# Patient Record
Sex: Male | Born: 1950 | Race: Black or African American | Hispanic: No | Marital: Married | Smoking: Former smoker
Health system: Southern US, Community
[De-identification: ages and names within clinical notes are randomized; demographics above are authoritative.]

## PROBLEM LIST (undated history)

## (undated) DIAGNOSIS — T8859XA Other complications of anesthesia, initial encounter: Secondary | ICD-10-CM

## (undated) DIAGNOSIS — K219 Gastro-esophageal reflux disease without esophagitis: Secondary | ICD-10-CM

## (undated) DIAGNOSIS — M5126 Other intervertebral disc displacement, lumbar region: Secondary | ICD-10-CM

## (undated) DIAGNOSIS — E119 Type 2 diabetes mellitus without complications: Secondary | ICD-10-CM

## (undated) DIAGNOSIS — M1990 Unspecified osteoarthritis, unspecified site: Secondary | ICD-10-CM

## (undated) DIAGNOSIS — I639 Cerebral infarction, unspecified: Secondary | ICD-10-CM

## (undated) DIAGNOSIS — E785 Hyperlipidemia, unspecified: Secondary | ICD-10-CM

## (undated) DIAGNOSIS — I10 Essential (primary) hypertension: Secondary | ICD-10-CM

## (undated) DIAGNOSIS — T4145XA Adverse effect of unspecified anesthetic, initial encounter: Secondary | ICD-10-CM

## (undated) DIAGNOSIS — M51369 Other intervertebral disc degeneration, lumbar region without mention of lumbar back pain or lower extremity pain: Secondary | ICD-10-CM

## (undated) DIAGNOSIS — S46909A Unspecified injury of unspecified muscle, fascia and tendon at shoulder and upper arm level, unspecified arm, initial encounter: Secondary | ICD-10-CM

## (undated) DIAGNOSIS — M5136 Other intervertebral disc degeneration, lumbar region: Secondary | ICD-10-CM

## (undated) HISTORY — PX: SHOULDER ARTHROSCOPY: SHX128

## (undated) HISTORY — PX: EYE SURGERY: SHX253

---
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
## 2004-08-28 ENCOUNTER — Ambulatory Visit: Payer: Self-pay | Admitting: Unknown Physician Specialty

## 2005-01-09 ENCOUNTER — Ambulatory Visit: Payer: Self-pay | Admitting: Unknown Physician Specialty

## 2005-03-07 ENCOUNTER — Ambulatory Visit: Payer: Self-pay | Admitting: Unknown Physician Specialty

## 2006-03-13 ENCOUNTER — Emergency Department: Payer: Self-pay | Admitting: Unknown Physician Specialty

## 2006-05-12 ENCOUNTER — Emergency Department: Payer: Self-pay | Admitting: Emergency Medicine

## 2006-05-24 ENCOUNTER — Emergency Department: Payer: Self-pay | Admitting: Emergency Medicine

## 2006-05-29 ENCOUNTER — Ambulatory Visit: Payer: Self-pay | Admitting: Internal Medicine

## 2006-06-19 ENCOUNTER — Ambulatory Visit: Payer: Self-pay | Admitting: Internal Medicine

## 2011-05-17 ENCOUNTER — Ambulatory Visit: Payer: Self-pay | Admitting: "Endocrinology

## 2012-12-29 ENCOUNTER — Emergency Department: Payer: Self-pay | Admitting: Emergency Medicine

## 2012-12-29 LAB — URINALYSIS, COMPLETE
Bacteria: NONE SEEN
Bilirubin,UR: NEGATIVE
Glucose,UR: NEGATIVE mg/dL (ref 0–75)
Ketone: NEGATIVE
Leukocyte Esterase: NEGATIVE
Nitrite: NEGATIVE
Ph: 5 (ref 4.5–8.0)
Protein: 30
RBC,UR: 2 /HPF (ref 0–5)
Specific Gravity: 1.024 (ref 1.003–1.030)
Squamous Epithelial: NONE SEEN
WBC UR: 1 /HPF (ref 0–5)

## 2012-12-29 LAB — COMPREHENSIVE METABOLIC PANEL
Albumin: 3.9 g/dL (ref 3.4–5.0)
Alkaline Phosphatase: 86 U/L (ref 50–136)
Anion Gap: 6 — ABNORMAL LOW (ref 7–16)
BUN: 17 mg/dL (ref 7–18)
Bilirubin,Total: 0.5 mg/dL (ref 0.2–1.0)
Calcium, Total: 9.8 mg/dL (ref 8.5–10.1)
Chloride: 105 mmol/L (ref 98–107)
Co2: 28 mmol/L (ref 21–32)
Creatinine: 1 mg/dL (ref 0.60–1.30)
EGFR (African American): >60
EGFR (Non-African Amer.): >60
Glucose: 150 mg/dL — ABNORMAL HIGH (ref 65–99)
Osmolality: 282 (ref 275–301)
Potassium: 4.1 mmol/L (ref 3.5–5.1)
SGOT(AST): 25 U/L (ref 15–37)
SGPT (ALT): 38 U/L (ref 12–78)
Sodium: 139 mmol/L (ref 136–145)
Total Protein: 8.1 g/dL (ref 6.4–8.2)

## 2012-12-29 LAB — CBC WITH DIFFERENTIAL/PLATELET
Basophil #: 0.1 10*3/uL (ref 0.0–0.1)
Basophil %: 1.1 %
Eosinophil #: 0.4 10*3/uL (ref 0.0–0.7)
Eosinophil %: 4.7 %
HCT: 44.9 % (ref 40.0–52.0)
HGB: 15.3 g/dL (ref 13.0–18.0)
Lymphocyte #: 3.2 10*3/uL (ref 1.0–3.6)
Lymphocyte %: 36.3 %
MCH: 29 pg (ref 26.0–34.0)
MCHC: 34 g/dL (ref 32.0–36.0)
MCV: 85 fL (ref 80–100)
Monocyte #: 0.9 x10 3/mm (ref 0.2–1.0)
Monocyte %: 9.7 %
Neutrophil #: 4.2 10*3/uL (ref 1.4–6.5)
Neutrophil %: 48.2 %
Platelet: 259 10*3/uL (ref 150–440)
RBC: 5.28 10*6/uL (ref 4.40–5.90)
RDW: 12.6 % (ref 11.5–14.5)
WBC: 8.8 10*3/uL (ref 3.8–10.6)

## 2012-12-29 LAB — LIPASE, BLOOD: Lipase: 142 U/L (ref 73–393)

## 2013-01-18 ENCOUNTER — Ambulatory Visit: Payer: Self-pay | Admitting: Family Medicine

## 2013-06-17 ENCOUNTER — Ambulatory Visit: Payer: Self-pay | Admitting: Family Medicine

## 2014-04-04 ENCOUNTER — Ambulatory Visit: Payer: Self-pay | Admitting: Orthopedic Surgery

## 2014-05-09 LAB — HEMOGLOBIN A1C: Hgb A1c MFr Bld: 6.4 % — AB (ref 4.0–6.0)

## 2014-06-08 ENCOUNTER — Ambulatory Visit: Payer: Self-pay | Admitting: Anesthesiology

## 2014-06-08 LAB — BASIC METABOLIC PANEL
Anion Gap: 7 (ref 7–16)
BUN: 16 mg/dL (ref 7–18)
Calcium, Total: 9.1 mg/dL (ref 8.5–10.1)
Chloride: 104 mmol/L (ref 98–107)
Co2: 29 mmol/L (ref 21–32)
Creatinine: 0.99 mg/dL (ref 0.60–1.30)
EGFR (African American): >60
EGFR (Non-African Amer.): >60
Glucose: 135 mg/dL — ABNORMAL HIGH (ref 65–99)
Osmolality: 283 (ref 275–301)
Potassium: 4.2 mmol/L (ref 3.5–5.1)
Sodium: 140 mmol/L (ref 136–145)

## 2014-06-15 ENCOUNTER — Ambulatory Visit: Payer: Self-pay | Admitting: General Practice

## 2014-08-31 DIAGNOSIS — Z9889 Other specified postprocedural states: Secondary | ICD-10-CM | POA: Insufficient documentation

## 2014-08-31 HISTORY — DX: Other specified postprocedural states: Z98.890

## 2014-09-23 DIAGNOSIS — M75122 Complete rotator cuff tear or rupture of left shoulder, not specified as traumatic: Secondary | ICD-10-CM | POA: Insufficient documentation

## 2014-09-23 HISTORY — DX: Complete rotator cuff tear or rupture of left shoulder, not specified as traumatic: M75.122

## 2014-12-10 ENCOUNTER — Ambulatory Visit
Admit: 2014-12-10 | Disposition: A | Discharge status: Home / Self Care | Payer: Self-pay | Attending: Surgery | Admitting: Surgery

## 2014-12-10 NOTE — Op Note (Signed)
PATIENT NAME:  Bruce ActonCARR, Miking D MR#:  [id number] DATE OF BIRTH:  Nov 05, 1950  DATE OF PROCEDURE:  06/15/2014   PREOPERATIVE DIAGNOSIS: Right rotator cuff tear (supraspinatus).   POSTOPERATIVE DIAGNOSIS:   Right rotator cuff tear (supraspinatus)  PROCEDURE PERFORMED: Right subacromial decompression and rotator cuff repair.   SURGEON: Illene LabradorJames P. Hooten, MD   ANESTHESIA: Interscalene block and general.   ESTIMATED BLOOD LOSS: Minimal.   FLUIDS REPLACED: 1700 mL of crystalloid.   DRAINS: None.   IMPLANTS UTILIZED: ArthroCare Spartan 5.5 mm PEEK suture anchors x 2.   INDICATIONS FOR SURGERY: The patient is a 64 year old male who has been seen for complaints of right shoulder pain and limited range of motion. MRI demonstrated findings consistent with full-thickness tear of supraspinatus tendon. After discussion of the risks and benefits of surgical intervention, the patient expressed understanding of the risks and benefits and agreed with plans for surgical intervention.   PROCEDURE IN DETAIL: The patient was brought to the operating room and, after adequate interscalene block and general endotracheal anesthesia was achieved, the patient was placed in the modified beach chair position. The head was secured in headrest and all bony prominences were well padded. The patient's right shoulder and arm were cleaned and prepped with alcohol and DuraPrep and draped in the usual sterile fashion. A "timeout" was performed as per usual protocol. The anticipated incision site was injected with 0.25% Marcaine with epinephrine. An anterior oblique incision was made roughly bisecting the anterior aspect of the acromion. Deltoid was split in line and a "deltoid on" approach was utilized by elevating the deltoid in a subperiosteal fashion off of the acromion. The subdeltoid bursa was excised. A prominent subacromial osteophyte was noted. A Darrach retractor was inserted so as to protect the cuff. An osteotome was used to  remove a 4 mm wafer of bone from the anterior/inferior aspect of the acromion. Additional debridement and decompression was performed using a TPS high-speed rasp. The wound was irrigated with copious amounts of normal saline with antibiotic solution. The shoulder was placed through range of motion and there was noted be a full-thickness tear of the supraspinatus tendon with some retraction. The leading edge of the supraspinatus tendon was debrided sharply using a scalpel. Rongeurs were used to create a bleeding base adjacent to the greater tuberosity. Two Spartan 5.5 mm PEEK suture anchors were inserted and the supraspinatus tendon was reapproximated to the bone bed using the ORTHOCORD sutures.   Excellent fixation was noted. The shoulder was placed through a range of motion with excellent decompression of the subacromial space and excellent maintenance of the repair. The wound was irrigated with copious amounts of normal saline with antibiotic solution. Good hemostasis was noted. The deltoid was repaired in a side-to-side fashion using interrupted sutures of #1 Ethibond. The subcutaneous tissue was approximated in layers using first #0 Vicryl followed by 2-0 Vicryl. The skin was closed using a running subcuticular suture of 4-0 Vicryl. Steri-Strips were applied followed by application of a sterile dressing. The patient tolerated the procedure well. He was transported to the recovery room in stable condition.    ____________________________ Illene LabradorJames P. Angie FavaHooten Jr., MD jph:jp D: 06/16/2014 00:17:55 ET T: 06/16/2014 10:27:00 ET JOB#: [id number]  cc: Fayrene FearingJames P. Angie FavaHooten Jr., MD, <Dictator> JAMES P Angie FavaHOOTEN JR MD ELECTRONICALLY SIGNED 06/17/2014 9:54

## 2014-12-20 ENCOUNTER — Encounter
Admission: RE | Admit: 2014-12-20 | Discharge: 2014-12-20 | Disposition: A | Discharge status: Home / Self Care | Payer: Medicaid Other | Source: Ambulatory Visit | Attending: Surgery | Admitting: Surgery

## 2014-12-20 DIAGNOSIS — Z01812 Encounter for preprocedural laboratory examination: Secondary | ICD-10-CM | POA: Diagnosis not present

## 2014-12-20 DIAGNOSIS — M75121 Complete rotator cuff tear or rupture of right shoulder, not specified as traumatic: Secondary | ICD-10-CM | POA: Diagnosis not present

## 2014-12-20 HISTORY — DX: Other intervertebral disc degeneration, lumbar region without mention of lumbar back pain or lower extremity pain: M51.369

## 2014-12-20 HISTORY — DX: Other intervertebral disc displacement, lumbar region: M51.26

## 2014-12-20 HISTORY — DX: Other complications of anesthesia, initial encounter: T88.59XA

## 2014-12-20 HISTORY — DX: Essential (primary) hypertension: I10

## 2014-12-20 HISTORY — DX: Type 2 diabetes mellitus without complications: E11.9

## 2014-12-20 HISTORY — DX: Gastro-esophageal reflux disease without esophagitis: K21.9

## 2014-12-20 HISTORY — DX: Adverse effect of unspecified anesthetic, initial encounter: T41.45XA

## 2014-12-20 HISTORY — DX: Other intervertebral disc degeneration, lumbar region: M51.36

## 2014-12-20 LAB — BASIC METABOLIC PANEL
Anion gap: 8 (ref 5–15)
BUN: 14 mg/dL (ref 6–20)
CO2: 27 mmol/L (ref 22–32)
Calcium: 9.4 mg/dL (ref 8.9–10.3)
Chloride: 105 mmol/L (ref 101–111)
Creatinine, Ser: 1.06 mg/dL (ref 0.61–1.24)
GFR calc Af Amer: >60 mL/min (ref >60)
GFR calc non Af Amer: >60 mL/min (ref >60)
Glucose, Bld: 155 mg/dL — ABNORMAL HIGH (ref 65–99)
Potassium: 4.1 mmol/L (ref 3.5–5.1)
Sodium: 140 mmol/L (ref 135–145)

## 2014-12-20 LAB — CBC
HCT: 46.5 % (ref 40.0–52.0)
Hemoglobin: 15.2 g/dL (ref 13.0–18.0)
MCH: 28.4 pg (ref 26.0–34.0)
MCHC: 32.8 g/dL (ref 32.0–36.0)
MCV: 86.7 fL (ref 80.0–100.0)
Platelets: 224 10*3/uL (ref 150–440)
RBC: 5.36 MIL/uL (ref 4.40–5.90)
RDW: 12.3 % (ref 11.5–14.5)
WBC: 7.1 10*3/uL (ref 3.8–10.6)

## 2014-12-20 NOTE — OR Nursing (Signed)
Pt scored high on suicide risk assessment.  Dr. Joice LoftsPoggi notified via Crystal at his office..Marland Kitchen

## 2014-12-20 NOTE — Patient Instructions (Signed)
  Your procedure is scheduled on: 12/22/14  Report to Day Surgery. To find out your arrival time please call [phone] between 1PM - 3PM on 12/21/14.  Remember: Instructions that are not followed completely may result in serious medical risk, up to and including death, or upon the discretion of your surgeon and anesthesiologist your surgery may need to be rescheduled.    _x___ 1. Do not eat food or drink liquids after midnight. No gum chewing or hard candies.     ____ 2. No Alcohol for 24 hours before or after surgery.   ____ 3. Bring all medications with you on the day of surgery if instructed.    _x__ 4. Notify your doctor if there is any change in your medical condition     (cold, fever, infections).     Do not wear jewelry, make-up, hairpins, clips or nail polish.  Do not wear lotions, powders, or perfumes. You may wear deodorant.  Do not shave 48 hours prior to surgery. Men may shave face and neck.  Do not bring valuables to the hospital.    [facility] is not responsible for any belongings or valuables.               Contacts, dentures or bridgework may not be worn into surgery.  Leave your suitcase in the car. After surgery it may be brought to your room.  For patients admitted to the hospital, discharge time is determined by your                treatment team.   Patients discharged the day of surgery will not be allowed to drive home.   Please read over the following fact sheets that you were given:      ____ Take these medicines the morning of surgery with A SIP OF WATER:    1. lisinopril (PRINIVIL,ZESTRIL  2. pantoprazole (PROTONIX) 40 MG tablet      ____ Fleet Enema (as directed)   ____ Use CHG Soap as directed  ____ Use inhalers on the day of surgery  _x___ Stop metformin 2 days prior to surgery    ____ Take 1/2 of usual insulin dose the night before surgery and none on the morning of surgery.   ____ Stop Coumadin/Plavix/aspirin on   ____ Stop  Anti-inflammatories on    ____ Stop supplements until after surgery.    ____ Bring C-Pap to the hospital.

## 2014-12-22 ENCOUNTER — Ambulatory Visit: Payer: Medicaid Other | Admitting: Anesthesiology

## 2014-12-22 ENCOUNTER — Encounter: Payer: Self-pay | Admitting: *Deleted

## 2014-12-22 ENCOUNTER — Ambulatory Visit
Admission: RE | Admit: 2014-12-22 | Discharge: 2014-12-22 | Disposition: A | Discharge status: Home / Self Care | Payer: Medicaid Other | Source: Ambulatory Visit | Attending: Surgery | Admitting: Surgery

## 2014-12-22 ENCOUNTER — Encounter
Admission: RE | Disposition: A | Discharge status: Home / Self Care | Payer: Self-pay | Source: Ambulatory Visit | Attending: Surgery

## 2014-12-22 DIAGNOSIS — E785 Hyperlipidemia, unspecified: Secondary | ICD-10-CM | POA: Insufficient documentation

## 2014-12-22 DIAGNOSIS — I10 Essential (primary) hypertension: Secondary | ICD-10-CM | POA: Insufficient documentation

## 2014-12-22 DIAGNOSIS — Z885 Allergy status to narcotic agent status: Secondary | ICD-10-CM | POA: Diagnosis not present

## 2014-12-22 DIAGNOSIS — M1990 Unspecified osteoarthritis, unspecified site: Secondary | ICD-10-CM | POA: Insufficient documentation

## 2014-12-22 DIAGNOSIS — M65811 Other synovitis and tenosynovitis, right shoulder: Secondary | ICD-10-CM | POA: Insufficient documentation

## 2014-12-22 DIAGNOSIS — Z87891 Personal history of nicotine dependence: Secondary | ICD-10-CM | POA: Insufficient documentation

## 2014-12-22 DIAGNOSIS — M7521 Bicipital tendinitis, right shoulder: Secondary | ICD-10-CM | POA: Insufficient documentation

## 2014-12-22 DIAGNOSIS — M5136 Other intervertebral disc degeneration, lumbar region: Secondary | ICD-10-CM | POA: Insufficient documentation

## 2014-12-22 DIAGNOSIS — M75101 Unspecified rotator cuff tear or rupture of right shoulder, not specified as traumatic: Secondary | ICD-10-CM | POA: Diagnosis not present

## 2014-12-22 DIAGNOSIS — Z79899 Other long term (current) drug therapy: Secondary | ICD-10-CM | POA: Insufficient documentation

## 2014-12-22 DIAGNOSIS — Z9889 Other specified postprocedural states: Secondary | ICD-10-CM | POA: Insufficient documentation

## 2014-12-22 DIAGNOSIS — E118 Type 2 diabetes mellitus with unspecified complications: Secondary | ICD-10-CM | POA: Diagnosis not present

## 2014-12-22 HISTORY — PX: SHOULDER ARTHROSCOPY: SHX128

## 2014-12-22 LAB — GLUCOSE, CAPILLARY
Glucose-Capillary: 234 mg/dL — ABNORMAL HIGH (ref 70–99)
Glucose-Capillary: 230 mg/dL — ABNORMAL HIGH (ref 70–99)

## 2014-12-22 SURGERY — ARTHROSCOPY, SHOULDER
Anesthesia: General | Laterality: Right | Wound class: Clean

## 2014-12-22 MED ORDER — ACETAMINOPHEN 10 MG/ML IV SOLN
INTRAVENOUS | Status: AC
  Filled 2014-12-22: qty 100

## 2014-12-22 MED ORDER — CEFAZOLIN SODIUM-DEXTROSE 2-3 GM-% IV SOLR
INTRAVENOUS | Status: AC
  Administered 2014-12-22: 2 g via INTRAVENOUS
  Filled 2014-12-22: qty 50

## 2014-12-22 MED ORDER — GLYCOPYRROLATE 0.2 MG/ML IJ SOLN
INTRAMUSCULAR | Status: DC | PRN
  Administered 2014-12-22: .8 mg via INTRAVENOUS

## 2014-12-22 MED ORDER — BUPIVACAINE-EPINEPHRINE (PF) 0.5% -1:200000 IJ SOLN
INTRAMUSCULAR | Status: DC | PRN
  Administered 2014-12-22: 20 mL via PERINEURAL

## 2014-12-22 MED ORDER — PHENYLEPHRINE HCL 10 MG/ML IJ SOLN
INTRAMUSCULAR | Status: DC | PRN
  Administered 2014-12-22 (×2): 100 ug via INTRAVENOUS

## 2014-12-22 MED ORDER — FENTANYL CITRATE (PF) 100 MCG/2ML IJ SOLN
INTRAMUSCULAR | Status: AC
  Administered 2014-12-22: 50 ug via INTRAVENOUS
  Administered 2014-12-22: 100 ug via INTRAVENOUS
  Administered 2014-12-22 (×2): 50 ug via INTRAVENOUS
  Filled 2014-12-22: qty 2

## 2014-12-22 MED ORDER — EPINEPHRINE HCL 1 MG/ML IJ SOLN
INTRAMUSCULAR | Status: DC | PRN
  Administered 2014-12-22: 1000 mL

## 2014-12-22 MED ORDER — EPHEDRINE SULFATE 50 MG/ML IJ SOLN
INTRAMUSCULAR | Status: DC | PRN
  Administered 2014-12-22: 10 mg via INTRAVENOUS
  Administered 2014-12-22: 15 mg via INTRAVENOUS

## 2014-12-22 MED ORDER — SODIUM CHLORIDE 0.9 % IV SOLN
INTRAVENOUS | Status: DC
  Administered 2014-12-22 (×3): via INTRAVENOUS

## 2014-12-22 MED ORDER — ONDANSETRON HCL 4 MG/2ML IJ SOLN: 4.0000 mg | Freq: Once | INTRAMUSCULAR | Status: DC | PRN

## 2014-12-22 MED ORDER — ONDANSETRON HCL 4 MG/2ML IJ SOLN
INTRAMUSCULAR | Status: DC | PRN
  Administered 2014-12-22: 4 mg via INTRAVENOUS

## 2014-12-22 MED ORDER — EPINEPHRINE HCL 1 MG/ML IJ SOLN
INTRAMUSCULAR | Status: AC
  Filled 2014-12-22: qty 2

## 2014-12-22 MED ORDER — NEOSTIGMINE METHYLSULFATE 10 MG/10ML IV SOLN
INTRAVENOUS | Status: DC | PRN
  Administered 2014-12-22: 5 mg via INTRAVENOUS

## 2014-12-22 MED ORDER — HYDROMORPHONE HCL 1 MG/ML IJ SOLN: 0.2500 mg | INTRAMUSCULAR | Status: DC | PRN

## 2014-12-22 MED ORDER — MIDAZOLAM HCL 5 MG/5ML IJ SOLN
INTRAMUSCULAR | Status: AC
  Administered 2014-12-22: 2 mg via INTRAVENOUS
  Filled 2014-12-22: qty 5

## 2014-12-22 MED ORDER — PROPOFOL 10 MG/ML IV BOLUS
INTRAVENOUS | Status: DC | PRN
  Administered 2014-12-22: 30 mg via INTRAVENOUS
  Administered 2014-12-22: 170 mg via INTRAVENOUS

## 2014-12-22 MED ORDER — PROPOFOL 10 MG/ML IV BOLUS: INTRAVENOUS | Status: DC | PRN

## 2014-12-22 MED ORDER — OXYCODONE HCL 5 MG PO TABS: 5.0000 mg | ORAL_TABLET | ORAL | Status: DC | PRN

## 2014-12-22 MED ORDER — ROCURONIUM BROMIDE 100 MG/10ML IV SOLN
INTRAVENOUS | Status: DC | PRN
  Administered 2014-12-22: 10 mg via INTRAVENOUS
  Administered 2014-12-22: 40 mg via INTRAVENOUS
  Administered 2014-12-22: 10 mg via INTRAVENOUS

## 2014-12-22 MED ORDER — BUPIVACAINE-EPINEPHRINE (PF) 0.5% -1:200000 IJ SOLN
INTRAMUSCULAR | Status: AC
  Filled 2014-12-22: qty 30

## 2014-12-22 MED ORDER — ROPIVACAINE HCL 5 MG/ML IJ SOLN
INTRAMUSCULAR | Status: AC
  Filled 2014-12-22: qty 20

## 2014-12-22 SURGICAL SUPPLY — 42 items
ANCHOR JUGGERKNOT 2.9 (Anchor) ×4 IMPLANT
ANCHOR JUGGERKNOT WTAP NDL 2.9 (Anchor) IMPLANT
BIT DRILL JUGRKNT W/NDL BIT2.9 (DRILL) ×1 IMPLANT
BLADE FULL RADIUS 3.5 (BLADE) ×2 IMPLANT
BLADE SHAVER 4.5X7 STR FR (MISCELLANEOUS) IMPLANT
BUR ACROMIONIZER 4.0 (BURR) ×2 IMPLANT
BUR BR 5.5 WIDE MOUTH (BURR) IMPLANT
CANNULA 8.5X75 THRED (CANNULA) ×2 IMPLANT
CANNULA SHAVER 8MMX76MM (CANNULA) ×2 IMPLANT
CHLORAPREP W/TINT 26ML (MISCELLANEOUS) ×2 IMPLANT
DRAPE IMP U-DRAPE 54X76 (DRAPES) ×4 IMPLANT
DRAPE SURG 17X11 SM STRL (DRAPES) ×2 IMPLANT
DRILL JUGGERKNOT W/NDL BIT 2.9 (DRILL) ×2
GAUZE PETRO XEROFOAM 1X8 (MISCELLANEOUS) ×2 IMPLANT
GAUZE SPONGE 4X4 12PLY STRL (GAUZE/BANDAGES/DRESSINGS) ×2 IMPLANT
GLOVE BIO SURGEON STRL SZ8 (GLOVE) ×4 IMPLANT
GLOVE INDICATOR 8.0 STRL GRN (GLOVE) ×6 IMPLANT
GOWN STRL REUS W/ TWL LRG LVL3 (GOWN DISPOSABLE) ×1 IMPLANT
GOWN STRL REUS W/ TWL XL LVL3 (GOWN DISPOSABLE) ×2 IMPLANT
GOWN STRL REUS W/TWL LRG LVL3 (GOWN DISPOSABLE) ×1
GOWN STRL REUS W/TWL XL LVL3 (GOWN DISPOSABLE) ×2
GRASPER SUT 15 45D LOW PRO (SUTURE) IMPLANT
IV LACTATED RINGER IRRG 3000ML (IV SOLUTION) ×2
IV LR IRRIG 3000ML ARTHROMATIC (IV SOLUTION) ×2 IMPLANT
MANIFOLD NEPTUNE II (INSTRUMENTS) ×2 IMPLANT
MASK FACE SPIDER DISP (MASK) ×2 IMPLANT
MAT BLUE FLOOR 46X72 FLO (MISCELLANEOUS) ×2 IMPLANT
NEEDLE REVERSE CUT 1/2 CRC (NEEDLE) ×2 IMPLANT
PACK ARTHROSCOPY SHOULDER (MISCELLANEOUS) ×2 IMPLANT
PAD GROUND ADULT SPLIT (MISCELLANEOUS) ×2 IMPLANT
SLING ARM LRG DEEP (SOFTGOODS) ×2 IMPLANT
SLING ULTRA II LG (MISCELLANEOUS) ×2 IMPLANT
STAPLER SKIN PROX 35W (STAPLE) IMPLANT
STRAP SAFETY BODY (MISCELLANEOUS) ×4 IMPLANT
SUT ETHIBOND 0 MO6 C/R (SUTURE) ×2 IMPLANT
SUT PROLENE 4 0 PS 2 18 (SUTURE) ×2 IMPLANT
SUT VIC AB 2-0 CT1 27 (SUTURE) ×2
SUT VIC AB 2-0 CT1 TAPERPNT 27 (SUTURE) ×2 IMPLANT
TAPE MICROFOAM 4IN (TAPE) ×2 IMPLANT
TUBING ARTHRO INFLOW-ONLY STRL (TUBING) ×2 IMPLANT
TUBING CONNECTING 10 (TUBING) ×2 IMPLANT
WAND HAND CNTRL MULTIVAC 90 (MISCELLANEOUS) ×2 IMPLANT

## 2014-12-22 NOTE — Op Note (Signed)
Preoperative diagnosis: Recurrent rotator cuff tear right shoulder.  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  Procedure: Arthroscopic debridement, arthroscopic subacromial decompression, mini-open rotator cuff repair, and mini-open biceps tenodesis right shoulder.  Anesthesia: General endotracheal with interscalene block placed preoperatively by the anesthesiologist.  Findings: As above. There was a small near full-thickness tear involving the anterior insertional fibers of the supraspinatus tendon. The labrum itself was in excellent condition. There also were some tendinopathic changes of the biceps tendon.  Complications: None  Fluids:  800 cc  Estimated blood loss: 25 cc  Tourniquet time: None  Drains: None  Closure: Staples   Brief clinical note: The patient is a 64 year old male who is now approximately 7-8 months status post a mini open rotator cuff repair of his right shoulder. He has noted continued pain in the right shoulder postoperatively. The patient's symptoms have persisted despite medications, activity modification, physical therapy, etc. The patient's history and examination were consistent with a recurrent rotator cuff tear. These findings were confirmed by MRI scan. The patient presents at this time for definitive management of these shoulder symptoms.  Procedure: The patient was brought into the operating room and lain in the supine position. After adequate IV sedation was achieved, the patient underwent placement of an interscalene block by the anesthesiologist. The patient then underwent general endotracheal intubation and anesthesia before being repositioned in the beach chair position using the beach chair position. The right shoulder and upper extremity were prepped with ChloraPrep solution before being draped sterilely. Preoperative antibiotics were administered. A timeout was performed to  confirm the proper side was prepped before the expected portal sites and incision site were injected with 0.5% Sensorcaine with epinephrine. A posterior portal was created and the glenohumeral joint thoroughly inspected with the findings as described above. An anterior portal was created using an outside-in technique. The labrum and rotator cuff were further probed, again confirming the above-noted findings. The full radius resector was used to debride the labrum and the synovitis noted anteriorly and superiorly. There was a moderate-sized sub-labral recess with a Buford complex, but the labrum otherwise was intact. The biceps tendon demonstrated some tendinopathy changes with synovitis along the tendon, so the tendon was released from its labral attachment using the ArthroCare wand. The ArthroCare wand also was used to obtain hemostasis, as well as to "anneal" the labrum superiorly and anteriorly. The instruments were removed from the joint after suctioning the excess fluid.  The camera was repositioned through the posterior portal into the subacromial space. A separate lateral portal was created using an outside-in technique. The 3.5 full-radius resector was introduced and used to perform a subtotal bursectomy. The ArthroCare wand was then inserted and used to remove the periosteal tissue off the undersurface of the anterior third of the acromion as well as to recess the coracoacromial ligament from its attachment along the anterior and lateral margins of the acromion. The 4.0 mm acromionizing bur was introduced and used to complete the decompression by removing the undersurface of the anterior third of the acromion. The full radius resector was reintroduced to remove any residual bony debris before the ArthroCare wand was reintroduced to obtain hemostasis. The instruments were then removed from the subacromial space after suctioning the excess fluid.  Using the previous incision, an approximately 4-5 cm  incision was made over the anterolateral aspect of the shoulder beginning at the anterolateral corner of the acromion and extending distally in line with the bicipital groove. This incision  was carried down through the subcutaneous tissues to expose the deltoid fascia. The raphae between the anterior and middle thirds was identified and this plane developed to provide access into the subacromial space. The rotator cuff tear was readily identified. The margins of the recurrent rotator cuff tear were debrided sharply with a #15 blade before the exposed greater tuberosity was roughened with a rongeur. The tear measured approximately 1 x 1.2 cm. The tear was repaired using a single Biomet 2.9 mm JuggerKnot anchor. These sutures were then brought back laterally and tied over bone bridges to create a two-layer closure. An apparent watertight closure was obtained.  The bicipital groove was identified by palpation and opened for 1-1.5 cm. The biceps tendon stump was retrieved through this defect. The floor of the bicipital groove was roughened with a curet before another Biomet 2.9 mm JuggerKnot anchor was inserted. Both sets of sutures were passed through the biceps tendon to effect the tenodesis. The bicipital sheath was reapproximated using two #0 Ethibond interrupted sutures, incorporating the biceps tendon to further reinforce the tenodesis.  The wound was copiously irrigated with sterile saline solution before the deltoid raphae was reapproximated using 2-0 Vicryl interrupted sutures. The subcutaneous tissues were closed in two layers using 2-0 Vicryl interrupted sutures before the skin was closed using staples. The portal sites also were closed using staples. A sterile bulky dressing was applied to the shoulder before the arm was placed into a shoulder immobilizer. The patient was then awakened, extubated, and returned to the recovery room in satisfactory condition after tolerating the procedure well.

## 2014-12-22 NOTE — Anesthesia Preprocedure Evaluation (Signed)
Anesthesia Evaluation  Patient identified by MRN, date of birth, ID band Patient awake    Reviewed: Allergy & Precautions, NPO status , Patient's Chart, lab work & pertinent test results  History of Anesthesia Complications (+) history of anesthetic complications  Airway Mallampati: III  TM Distance: >3 FB Neck ROM: Full    Dental  (+) Partial Upper   Pulmonary former smoker,    Pulmonary exam normal       Cardiovascular Exercise Tolerance: Good hypertension, Pt. on medications Normal cardiovascular exam    Neuro/Psych negative neurological ROS  negative psych ROS   GI/Hepatic Neg liver ROS, GERD-  ,  Endo/Other  diabetes, Type 2, Oral Hypoglycemic Agents  Renal/GU negative Renal ROS     Musculoskeletal  (+) Arthritis -, Osteoarthritis,    Abdominal   Peds  Hematology negative hematology ROS (+)   Anesthesia Other Findings Pt states is "difficult to put to sleep"  Reproductive/Obstetrics                             Anesthesia Physical Anesthesia Plan  ASA: III  Anesthesia Plan: General   Post-op Pain Management:    Induction: Intravenous  Airway Management Planned: Oral ETT  Additional Equipment:   Intra-op Plan:   Post-operative Plan: Extubation in OR  Informed Consent: I have reviewed the patients History and Physical, chart, labs and discussed the procedure including the risks, benefits and alternatives for the proposed anesthesia with the patient or authorized representative who has indicated his/her understanding and acceptance.   Dental advisory given  Plan Discussed with: CRNA and Surgeon  Anesthesia Plan Comments:         Anesthesia Quick Evaluation

## 2014-12-22 NOTE — Discharge Instructions (Addendum)
Keep dressing dry and intact. May shower after dressing changed on post-op day #4 (Monday). Cover staples/sutures with Band-Aids after drying off. Apply ice frequently to shoulder. Keep shoulder immobilizer on at all times except may remove for bathing purposes. Follow-up in 10-14 days or as scheduled. Will mail appt.

## 2014-12-22 NOTE — Anesthesia Procedure Notes (Addendum)
Anesthesia Regional Block:  Interscalene brachial plexus block  Pre-Anesthetic Checklist: ,, timeout performed, Correct Patient, Correct Site, Correct Laterality, Correct Procedure, Correct Position, risks and benefits discussed,, pre-op evaluation, at surgeon's request and post-op pain management  Laterality: Right  Prep: chloraprep       Needles:  Injection technique: Single-shot  Needle Type: Stimiplex     Needle Length: 5cm 5 cm Needle Gauge: 22 and 22 G    Additional Needles:  Procedures: nerve stimulator Interscalene brachial plexus block  Nerve Stimulator or Paresthesia:  Response: deltoid, 0.33 mA, 5 cm  Additional Responses:   Narrative:  Start time: 12/22/2014 9:50 AM End time: 12/22/2014 9:54 AM  Performed by: Personally  Anesthesiologist: Forestine ChutePOLIN, CARRIE

## 2014-12-22 NOTE — Transfer of Care (Signed)
Immediate Anesthesia Transfer of Care Note  Patient: Bruce PigeonHarold Dean Carbonell  Procedure(s) Performed: Procedure(s): RIGHT SHOULDER ARTHROSCOPY /DECOMPRESSION/ROTATOR CUFF REPAIR OF RECURRENT ROTATOR CUFF TEAR (Right)  Patient Location: PACU  Anesthesia Type:General  Level of Consciousness: awake  Airway & Oxygen Therapy: Patient Spontanous Breathing  Post-op Assessment: Report given to RN  Post vital signs: Reviewed and stable  Last Vitals:  Filed Vitals:   12/22/14 0900  BP: 185/97  Pulse: 90  Temp: 37.1 C  Resp: 20    Complications: No apparent anesthesia complications

## 2014-12-22 NOTE — Anesthesia Postprocedure Evaluation (Signed)
  Anesthesia Post-op Note  Patient: Bruce Richardson  Procedure(s) Performed: Procedure(s): RIGHT SHOULDER ARTHROSCOPY /DECOMPRESSION/ROTATOR CUFF REPAIR OF RECURRENT ROTATOR CUFF TEAR (Right)  Anesthesia type:General  Patient location: PACU  Post pain: Pain level controlled  Post assessment: Post-op Vital signs reviewed, Patient's Cardiovascular Status Stable, Respiratory Function Stable, Patent Airway and No signs of Nausea or vomiting  Post vital signs: Reviewed and stable  Last Vitals:  Filed Vitals:   12/22/14 1342  BP: 141/70  Pulse: 84  Temp:   Resp: 18    Level of consciousness: awake, alert  and patient cooperative  Complications: No apparent anesthesia complications

## 2014-12-22 NOTE — H&P (Signed)
Paper H&P to be scanned into permanent record. H&P reviewed. No changes. 

## 2014-12-23 DIAGNOSIS — S46909A Unspecified injury of unspecified muscle, fascia and tendon at shoulder and upper arm level, unspecified arm, initial encounter: Secondary | ICD-10-CM | POA: Insufficient documentation

## 2014-12-23 HISTORY — DX: Unspecified injury of unspecified muscle, fascia and tendon at shoulder and upper arm level, unspecified arm, initial encounter: S46.909A

## 2014-12-27 ENCOUNTER — Encounter: Payer: Self-pay | Admitting: Surgery

## 2014-12-31 ENCOUNTER — Emergency Department
Admission: EM | Admit: 2014-12-31 | Discharge: 2014-12-31 | Disposition: A | Discharge status: Home / Self Care | Payer: Medicaid Other | Attending: Emergency Medicine | Admitting: Emergency Medicine

## 2014-12-31 ENCOUNTER — Encounter: Payer: Self-pay | Admitting: Emergency Medicine

## 2014-12-31 ENCOUNTER — Emergency Department: Payer: Medicaid Other

## 2014-12-31 DIAGNOSIS — Y998 Other external cause status: Secondary | ICD-10-CM | POA: Insufficient documentation

## 2014-12-31 DIAGNOSIS — Y92410 Unspecified street and highway as the place of occurrence of the external cause: Secondary | ICD-10-CM | POA: Diagnosis not present

## 2014-12-31 DIAGNOSIS — S139XXA Sprain of joints and ligaments of unspecified parts of neck, initial encounter: Secondary | ICD-10-CM | POA: Diagnosis not present

## 2014-12-31 DIAGNOSIS — Y9389 Activity, other specified: Secondary | ICD-10-CM | POA: Diagnosis not present

## 2014-12-31 DIAGNOSIS — Z79899 Other long term (current) drug therapy: Secondary | ICD-10-CM | POA: Insufficient documentation

## 2014-12-31 DIAGNOSIS — S199XXA Unspecified injury of neck, initial encounter: Secondary | ICD-10-CM | POA: Diagnosis present

## 2014-12-31 DIAGNOSIS — Z87891 Personal history of nicotine dependence: Secondary | ICD-10-CM | POA: Diagnosis not present

## 2014-12-31 DIAGNOSIS — E119 Type 2 diabetes mellitus without complications: Secondary | ICD-10-CM | POA: Diagnosis not present

## 2014-12-31 DIAGNOSIS — I10 Essential (primary) hypertension: Secondary | ICD-10-CM | POA: Diagnosis not present

## 2014-12-31 HISTORY — DX: Hyperlipidemia, unspecified: E78.5

## 2014-12-31 MED ORDER — CYCLOBENZAPRINE HCL 10 MG PO TABS: 10.0000 mg | ORAL_TABLET | Freq: Three times a day (TID) | ORAL | Status: DC | PRN

## 2014-12-31 NOTE — ED Notes (Signed)
Pt initially triaged to main due to hypertension. Pt sent to see wife who is patient in main. BP rechecked and as noted. To flex.

## 2014-12-31 NOTE — Discharge Instructions (Signed)
Cervical Sprain °A cervical sprain is an injury in the neck in which the strong, fibrous tissues (ligaments) that connect your neck bones stretch or tear. Cervical sprains can range from mild to severe. Severe cervical sprains can cause the neck vertebrae to be unstable. This can lead to damage of the spinal cord and can result in serious nervous system problems. The amount of time it takes for a cervical sprain to get better depends on the cause and extent of the injury. Most cervical sprains heal in 1 to 3 weeks. °CAUSES  °Severe cervical sprains may be caused by:  °· Contact sport injuries (such as from football, rugby, wrestling, hockey, auto racing, gymnastics, diving, martial arts, or boxing).   °· Motor vehicle collisions.   °· Whiplash injuries. This is an injury from a sudden forward and backward whipping movement of the head and neck.  °· Falls.   °Mild cervical sprains may be caused by:  °· Being in an awkward position, such as while cradling a telephone between your ear and shoulder.   °· Sitting in a chair that does not offer proper support.   °· Working at a poorly designed computer station.   °· Looking up or down for long periods of time.   °SYMPTOMS  °· Pain, soreness, stiffness, or a burning sensation in the front, back, or sides of the neck. This discomfort may develop immediately after the injury or slowly, 24 hours or more after the injury.   °· Pain or tenderness directly in the middle of the back of the neck.   °· Shoulder or upper back pain.   °· Limited ability to move the neck.   °· Headache.   °· Dizziness.   °· Weakness, numbness, or tingling in the hands or arms.   °· Muscle spasms.   °· Difficulty swallowing or chewing.   °· Tenderness and swelling of the neck.   °DIAGNOSIS  °Most of the time your health care provider can diagnose a cervical sprain by taking your history and doing a physical exam. Your health care provider will ask about previous neck injuries and any known neck  problems, such as arthritis in the neck. X-rays may be taken to find out if there are any other problems, such as with the bones of the neck. Other tests, such as a CT scan or MRI, may also be needed.  °TREATMENT  °Treatment depends on the severity of the cervical sprain. Mild sprains can be treated with rest, keeping the neck in place (immobilization), and pain medicines. Severe cervical sprains are immediately immobilized. Further treatment is done to help with pain, muscle spasms, and other symptoms and may include: °· Medicines, such as pain relievers, numbing medicines, or muscle relaxants.   °· Physical therapy. This may involve stretching exercises, strengthening exercises, and posture training. Exercises and improved posture can help stabilize the neck, strengthen muscles, and help stop symptoms from returning.   °HOME CARE INSTRUCTIONS  °· Put ice on the injured area.   °¨ Put ice in a plastic bag.   °¨ Place a towel between your skin and the bag.   °¨ Leave the ice on for 15-20 minutes, 3-4 times a day.   °· If your injury was severe, you may have been given a cervical collar to wear. A cervical collar is a two-piece collar designed to keep your neck from moving while it heals. °¨ Do not remove the collar unless instructed by your health care provider. °¨ If you have long hair, keep it outside of the collar. °¨ Ask your health care provider before making any adjustments to your collar. Minor   adjustments may be required over time to improve comfort and reduce pressure on your chin or on the back of your head.  Ifyou are allowed to remove the collar for cleaning or bathing, follow your health care provider's instructions on how to do so safely.  Keep your collar clean by wiping it with mild soap and water and drying it completely. If the collar you have been given includes removable pads, remove them every 1-2 days and hand wash them with soap and water. Allow them to air dry. They should be completely  dry before you wear them in the collar.  If you are allowed to remove the collar for cleaning and bathing, wash and dry the skin of your neck. Check your skin for irritation or sores. If you see any, tell your health care provider.  Do not drive while wearing the collar.   Only take over-the-counter or prescription medicines for pain, discomfort, or fever as directed by your health care provider.   Keep all follow-up appointments as directed by your health care provider.   Keep all physical therapy appointments as directed by your health care provider.   Make any needed adjustments to your workstation to promote good posture.   Avoid positions and activities that make your symptoms worse.   Warm up and stretch before being active to help prevent problems.  SEEK MEDICAL CARE IF:   Your pain is not controlled with medicine.   You are unable to decrease your pain medicine over time as planned.   Your activity level is not improving as expected.  SEEK IMMEDIATE MEDICAL CARE IF:   You develop any bleeding.  You develop stomach upset.  You have signs of an allergic reaction to your medicine.   Your symptoms get worse.   You develop new, unexplained symptoms.   You have numbness, tingling, weakness, or paralysis in any part of your body.  MAKE SURE YOU:   Understand these instructions.  Will watch your condition.  Will get help right away if you are not doing well or get worse. Document Released: 06/02/2007 Document Revised: 08/10/2013 Document Reviewed: 02/10/2013 [facility] Patient Information 2015 [place], [place]. This information is not intended to replace advice given to you by your health care provider. Make sure you discuss any questions you have with your health care provider.  Motor Vehicle Collision After a car crash (motor vehicle collision), it is normal to have bruises and sore muscles. The first 24 hours usually feel the worst. After that, you will  likely start to feel better each day. HOME CARE  Put ice on the injured area.  Put ice in a plastic bag.  Place a towel between your skin and the bag.  Leave the ice on for 15-20 minutes, 03-04 times a day.  Drink enough fluids to keep your pee (urine) clear or pale yellow.  Do not drink alcohol.  Take a warm shower or bath 1 or 2 times a day. This helps your sore muscles.  Return to activities as told by your doctor. Be careful when lifting. Lifting can make neck or back pain worse.  Only take medicine as told by your doctor. Do not use aspirin. GET HELP RIGHT AWAY IF:   Your arms or legs tingle, feel weak, or lose feeling (numbness).  You have headaches that do not get better with medicine.  You have neck pain, especially in the middle of the back of your neck.  You cannot control when you pee (urinate)  or poop (bowel movement).  Pain is getting worse in any part of your body.  You are short of breath, dizzy, or pass out (faint).  You have chest pain.  You feel sick to your stomach (nauseous), throw up (vomit), or sweat.  You have belly (abdominal) pain that gets worse.  There is blood in your pee, poop, or throw up.  You have pain in your shoulder (shoulder strap areas).  Your problems are getting worse. MAKE SURE YOU:   Understand these instructions.  Will watch your condition.  Will get help right away if you are not doing well or get worse. Document Released: 01/22/2008 Document Revised: 10/28/2011 Document Reviewed: 01/02/2011 [facility] Patient Information 2015 [place]. This information is not intended to replace advice given to you by your health care provider. Make sure you discuss any questions you have with your health care provider.  Continue oxycodone as needed and follow up with the orthopedist

## 2014-12-31 NOTE — ED Notes (Signed)
D/C instructions given and reviewed. Pt verbalized an understanding and has no additional questions or concerns at this time. Pt ambulating independently, family accompanying patient.  
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
## 2015-01-10 ENCOUNTER — Ambulatory Visit: Payer: Medicaid Other | Attending: Surgery | Admitting: Physical Therapy

## 2015-01-10 DIAGNOSIS — Z9889 Other specified postprocedural states: Secondary | ICD-10-CM

## 2015-01-10 DIAGNOSIS — M25511 Pain in right shoulder: Secondary | ICD-10-CM

## 2015-01-11 DIAGNOSIS — M25511 Pain in right shoulder: Secondary | ICD-10-CM | POA: Diagnosis not present

## 2015-01-11 NOTE — Patient Instructions (Signed)
All exercises provided were adapted from [email or web address]. Patient was provided a written handout with pictures as described. Any additional cues were manually entered in to handout and copied in to this document.    WAND-External Rotation 90 degrees  (10 times, 2 seconds, 2 sets, 3x per week)  Lying on your back and holding a wand, palm face up the injured side and palm face down on the uninjured, push the wand to the side and let your injured shoulder roll outward.  Keep your elbow at 90 degrees the whole time.     ** Every other day **    WAND EXTERNAL ROTATION - SUPINE  (10 times, 2 seconds, 2 sets, 3x per week)  Lying on your back and holding a wand, palm face up the injured side and palm face down on the uninjured, push the wand to the side and let your injured shoulder roll outward.      ** Every other day **    Shoulder Flexion Active Assistive (10 times, 2 seconds, 2 sets, 3x per week)  Laying on your back, grasp your affected arm around the wrist and gently pull up towards the ceiling, letting your good arm do the work.      ** Every other day**   At least 2 minutes between each set and each exercise.
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
## 2015-01-17 ENCOUNTER — Encounter: Payer: Self-pay | Admitting: Physical Therapy

## 2015-01-17 ENCOUNTER — Ambulatory Visit: Payer: Medicaid Other | Admitting: Physical Therapy

## 2015-01-17 DIAGNOSIS — M25511 Pain in right shoulder: Secondary | ICD-10-CM

## 2015-01-17 DIAGNOSIS — Z9889 Other specified postprocedural states: Secondary | ICD-10-CM

## 2015-01-17 NOTE — Therapy (Addendum)
[place] MAIN [facility] SERVICES [address] [place], [place], [address] Phone: [phone]   Fax:  [phone]  Physical Therapy Treatment  Patient Details  Name: Bruce Richardson MRN: [id number] Date of Birth: September 16, 1950 Referring Provider:  Arlis Porta., MD  Encounter Date: 01/17/2015      PT End of Session - 01/17/15 1550    Visit Number 2   Number of Visits 3   Date for PT Re-Evaluation 03/08/15   Authorization Type Medicaid    PT Start Time 1300   PT Stop Time 1350   PT Time Calculation (min) 50 min   Activity Tolerance Patient limited by pain   Behavior During Therapy [facility] for tasks assessed/performed      Past Medical History  Diagnosis Date  . Complication of anesthesia     woke up during colonoscopy, hard to put under.  . Diabetes mellitus without complication   . Hypertension   . Elevated lipids   . GERD (gastroesophageal reflux disease)   . Bulging lumbar disc     lower back  . Hyperlipidemia     Past Surgical History  Procedure Laterality Date  . Shoulder arthroscopy Bilateral   . Shoulder arthroscopy Right 12/22/2014    Procedure: RIGHT SHOULDER ARTHROSCOPY /DECOMPRESSION/ROTATOR CUFF REPAIR OF RECURRENT ROTATOR CUFF TEAR;  Surgeon: Corky Mull, MD;  Location: [facility] ORS;  Service: Orthopedics;  Laterality: Right;    There were no vitals filed for this visit.  Visit Diagnosis:  S/P rotator cuff repair  Right shoulder pain      Subjective Assessment - 01/17/15 1543    Subjective Patient reports he had been compliant with passive stretching and has maintained his arm sling in place as instructed. Patient reports constant pain in his right arm, from roughly the deltoid insertion down to the elbow. He describes it as muscular pain. Patient reports completing his pendulums and adhering to performing exercises every other day for his RUE.   Pertinent History Patient had right RTC repair without labral involvement  on 12/22/2014 by    Limitations Lifting   Patient Stated Goals Patient would like to increase his function and decrease his pain of his RUE.    Pain Score 7    Pain Location Arm   Pain Orientation Right   Pain Descriptors / Indicators Aching   Pain Type Surgical pain;Chronic pain   Pain Frequency Constant       Manual therapy  Soft tissue mobilization provided to right biceps area, well tolerated and palpable myofascial restriction was markedly reduced.    There-Ex  Isometric ER at wall for RUE x 10 with 3 second hold, cuing for elbow flexed  Isometric IR at wall for RUE x 10 with 3 second hold, cuing for elbow flexed  Isometric extension for RUE x 10 with 3 second hold, cuing for elbow flexed  Isometric flexion for RUE x 10 with 3 second hold, cuing for elbow flexed. Isometric elbow flexion for RUE x 10 with 3 second hold  **For all isometric exercises patient instructed to provide minimal force into the wall Passive ROM limited to roughly 115-125 degrees in shoulder flexion prior to onset of pain. Full IR ROM passively today, ER limited to 0-5 degrees before firm end feel. Passive ER and IR in sitting with wand x 10 bilaterally, cuing for having both palms facing the ceiling  Passive shoulder flexion with wand in supine x 10 with cuing for slow reciprocal motion  Pendulums in standing forwards and backwards x 10, he tolerated with slower motions but experienced increased pain with faster motions.  Ice provided to shoulder after treatment session Lysle Morales)                          PT Education - 01/17/15 1546    Education provided Yes   Education Details Patient educated on isometric contractions and updated HEP. Patient educated to continue use of sling and decrease exercises to once every 3 days. Discussed course of rehab with patient and that post-surgical pain is common, and that the recovery process for RTC repairs is lengthy in duration.    Person(s)  Educated Patient   Methods Explanation;Demonstration;Handout;Verbal cues   Comprehension Verbalized understanding;Returned demonstration             PT Long Term Goals - 01/17/15 1550    PT LONG TERM GOAL #1   Title Patient will be independent and compliant with a home exercise program to increase his range of motion, strength, and decrease pain in his RUE by 03/08/2015.   Status Achieved   PT LONG TERM GOAL #2   Title Patient will demonstrate [facility] PROM of his RUE in shoulder flexion, extension, internal rotation, abduciton, and external rotation to demonstrate increased function of his RUE by 03/08/2015   Status Partially Met   PT LONG TERM GOAL #3   Title Patient will report a Quick DASH score of less than 40 to demonstrate increased function and tolerance for activity with his RUE by 03/08/2015.   Status On-going   PT LONG TERM GOAL #4   Title Patient will be compliant with shoulder sling and abduction pillow until discharged by referring physician, and recall shoulder precautions during follow up visits to demonstrate safety with repair by 03/08/2015.    Status Achieved   PT LONG TERM GOAL #5   Title Patient will demonstrate WFL AROM of his RUE in all planes and directions, after clearance for AROM by physician, to demonstrate increased function and activity tolerance by 03/08/2015   Status Unable to assess   PT LONG TERM GOAL #6   Title Patient will demonstrate at least 4/5 strength in shoulder flexion, extension, external rotation, and internal rotation to demonstrate increased strength and function of RUE by 03/08/2015.   Status Unable to assess               Plan - 01/17/15 1600    Clinical Impression Statement Patient continues to present with significant pain levels in his right UE, which is largely in his biceps area. Shoulder brace and sling were loosened slightly to minimize the tension in his RUE, which seemed to help alleviate some of the tension he was feeling.  Patient is able to tolerate his isometric exercises, but continues to display limited PROM in all planes and directions. Patient is primarily limited now by pain, and has a palpable knot/myofascial restriction in his right biceps area. Patient instructed to continue PROM with wand as provided in previous HEP and to initiate isometric exercises every third day. Skilled PT services are indicated at this time to continue to address his post-surgical rehab process.    Pt will benefit from skilled therapeutic intervention in order to improve on the following deficits Decreased strength;Decreased activity tolerance;Impaired flexibility;Decreased range of motion;Pain;Impaired UE functional use   Rehab Potential Good   Clinical Impairments Affecting Rehab Potential Positive: Excellent recall of prior exercises and precautions. Negative: history of  multiple RTC repairs bilaterally.    PT Frequency 2x / week   PT Duration 8 weeks   PT Treatment/Interventions ADLs/Self Care Home Management;Manual techniques;Therapeutic activities;Therapeutic exercise;Cryotherapy;Moist Heat;Neuromuscular re-education;Passive range of motion;Patient/family education;Taping   PT Next Visit Plan Continue per JOSPT protocol, re-assess pain levels, passive and AAROM. Can initiate rhytmic stabilization drills   PT Home Exercise Plan See patient instructions    Consulted and Agree with Plan of Care Patient        Problem List There are no active problems to display for this patient.  Kerman Passey, PT, DPT   01/18/2015, 8:41 AM  [place] MAIN [facility] SERVICES [address] [place], [place], [address] Phone: [phone]   Fax:  [phone]

## 2015-01-18 NOTE — Patient Instructions (Signed)
All exercises provided were adapted from [email or web address].[email or web address]. Patient was provided a written handout with pictures as described. Any additional cues were manually entered in to handout and copied in to this document.   Shoulder isometric-external rotation  (10 times, 3 second hold 1 set, 3 times per day)  Hold your arm against your side, with your elbow at a 90 degree angle. Without moving your body, push the back of your hand into the wall. You should feel your shoulder muscles contract. Repeat contract and relax motion.    Shoulder isometric-extension  (10 times, 3 second hold 1 set, 3 times per day)  Hold your arm against your side, with your elbow at a 90 degree angle. Without moving your body, push your arm back into the wall. You should feel your shoulder muscles contract. Repeat contract and relax motion.    Shoulder isometric-flexion (10 times, 3 second hold 1 set, 3 times per day)  Hold your arm against your side, with your elbow at a 90 degree angle. Without moving your body, push your fist straight into the wall ahead of you. You should feel your shoulder muscles contract. Repeat contract and relax motion.     Shoulder isometric-internal rotation (10 times, 3 second hold 1 set, 3 times per day)  Hold your arm against your side, with your elbow at a 90 degree angle. Without actually moving your arm, push your hand into the wall. You should feel your shoulder muscles contract. Repeat contract and relax motion.

## 2015-01-23 NOTE — Addendum Note (Signed)
Addended byAlva Garnet: MCNAMARA, PATRICK A on: 01/23/2015 04:54 PM   Modules accepted: Orders

## 2015-01-24 ENCOUNTER — Other Ambulatory Visit: Payer: Self-pay | Admitting: Family Medicine

## 2015-01-24 ENCOUNTER — Telehealth: Payer: Self-pay

## 2015-01-24 MED ORDER — LISINOPRIL 40 MG PO TABS: 40.0000 mg | ORAL_TABLET | Freq: Every day | ORAL | Status: DC

## 2015-01-24 MED ORDER — SIMVASTATIN 20 MG PO TABS: 20.0000 mg | ORAL_TABLET | Freq: Every day | ORAL | Status: DC

## 2015-01-24 MED ORDER — METFORMIN HCL 1000 MG PO TABS: 1000.0000 mg | ORAL_TABLET | Freq: Two times a day (BID) | ORAL | Status: DC

## 2015-01-24 MED ORDER — PANTOPRAZOLE SODIUM 40 MG PO TBEC: 40.0000 mg | DELAYED_RELEASE_TABLET | Freq: Every day | ORAL | Status: DC

## 2015-01-24 NOTE — Telephone Encounter (Signed)
I have reordered all these meds for him.  Pantoprazole was in his EPIC chart.

## 2015-01-24 NOTE — Telephone Encounter (Signed)
[facility] Bruce Richardson called stating patient brought bottles to refill Pantoprazole.Marland Kitchen.Marland Kitchen.Marland Kitchen.Lisinopril... Metformin....and Simvastatin. Please review as I do not see the Pantoprazole in [employer].

## 2015-01-31 ENCOUNTER — Other Ambulatory Visit: Payer: Self-pay | Admitting: Family Medicine

## 2015-01-31 MED ORDER — METFORMIN HCL 1000 MG PO TABS: 1000.0000 mg | ORAL_TABLET | Freq: Two times a day (BID) | ORAL | Status: DC

## 2015-01-31 MED ORDER — SIMVASTATIN 20 MG PO TABS: 20.0000 mg | ORAL_TABLET | Freq: Every day | ORAL | Status: DC

## 2015-01-31 MED ORDER — LISINOPRIL 40 MG PO TABS: 40.0000 mg | ORAL_TABLET | Freq: Every day | ORAL | Status: DC

## 2015-01-31 MED ORDER — PANTOPRAZOLE SODIUM 40 MG PO TBEC: 40.0000 mg | DELAYED_RELEASE_TABLET | Freq: Every day | ORAL | Status: DC

## 2015-02-02 ENCOUNTER — Ambulatory Visit: Payer: Medicaid Other | Admitting: Physical Therapy

## 2015-02-09 ENCOUNTER — Encounter: Payer: Self-pay | Admitting: Physical Therapy

## 2015-02-09 ENCOUNTER — Ambulatory Visit: Payer: Medicaid Other | Attending: Surgery | Admitting: Physical Therapy

## 2015-02-09 DIAGNOSIS — Z9889 Other specified postprocedural states: Secondary | ICD-10-CM

## 2015-02-09 DIAGNOSIS — M25511 Pain in right shoulder: Secondary | ICD-10-CM

## 2015-02-09 NOTE — Patient Instructions (Addendum)
Bicep curls with 2# dumbbells x 10 for 2 sets  Side lying active shoulder flexion x 10 for 2 sets  Shoulder flexion with wand x 10 in supine for 2 sets (PROM - 115 degrees)  Side lying shoulder ER active x 10 for 2 sets  Prone rows x 10 for 2 sets  Prone abductions x 10 for 2 sets with elbow flexed (cuing both verbal and tactile for scapular retractions).  Supine horizontal adductions/abductions x 10 with 10 second holds AAROM with wand  All exercises were provided in a hand out from [email or web address].com   Soft tissue mobilization provided to right deltoid region/biceps/pec minor area, well tolerated.

## 2015-02-10 NOTE — Therapy (Signed)
[facility] REH[facility] SERVICES [address] [place], , [address] Phone: [phone]   Fax:  [phone]  Physical Therapy Treatment  Patient Details  Name: Bruce Richardson MRN: [id number] Date of Birth: 09/16/1950 Referring Provider:  Hawkins, James H Jr., MD  Encounter Date: 02/09/2015      PT End of Session - 02/10/15 1338    Visit Number 3   Number of Visits 4   Date for PT Re-Evaluation 03/08/15   Authorization Type Medicaid    PT Start Time 0940   PT Stop Time 1030   PT Time Calculation (min) 50 min   Activity Tolerance Patient limited by pain;Patient tolerated treatment well   Behavior During Therapy WFL for tasks assessed/performed;Agitated      Past Medical History  Diagnosis Date  . Complication of anesthesia     woke up during colonoscopy, hard to put under.  . Diabetes mellitus without complication   . Hypertension   . Elevated lipids   . GERD (gastroesophageal reflux disease)   . Bulging lumbar disc     lower back  . Hyperlipidemia     Past Surgical History  Procedure Laterality Date  . Shoulder arthroscopy Bilateral   . Shoulder arthroscopy Right 12/22/2014    Procedure: RIGHT SHOULDER ARTHROSCOPY /DECOMPRESSION/ROTATOR CUFF REPAIR OF RECURRENT ROTATOR CUFF TEAR;  Surgeon: John J Poggi, MD;  Location: [facility] ORS;  Service: Orthopedics;  Laterality: Right;    There were no vitals filed for this visit.  Visit Diagnosis:  S/P rotator cuff repair  Right shoulder pain      Subjective Assessment - 02/09/15 0944    Subjective Patient reports he has been performing his exercises frequently, but continues to have pain in his right shoulder. He also reports his left shoulder hurts now as well. He continues to wear his sling as Dr. Poggi has not cleared him to be dangling at this time (full time at least).    Pertinent History Patient had right RTC repair without labral involvement on 12/22/2014 by    Limitations  Lifting   Patient Stated Goals Patient would like to increase his function and decrease his pain of his RUE.    Currently in Pain? Yes   Pain Score 4    Pain Location Arm   Pain Orientation Right   Pain Descriptors / Indicators Aching   Pain Type Chronic pain;Surgical pain   Pain Frequency Constant   Multiple Pain Sites Yes   Pain Score 7   Pain Location Arm   Pain Orientation Left   Pain Descriptors / Indicators Aching   Pain Onset 1 to 4 weeks ago   Pain Frequency Intermittent     There-Ex Bicep curls with 2# dumbbells x 10 for 2 sets  Side lying active shoulder flexion x 10 for 2 sets  Shoulder flexion with wand x 10 in supine for 2 sets (PROM - 115 degrees)  Side lying shoulder ER active x 10 for 2 sets  Prone rows x 10 for 2 sets  Prone abductions x 10 for 2 sets with elbow flexed (cuing both verbal and tactile for scapular retractions).  Supine horizontal adductions/abductions x 10 with 10 second holds AAROM with wand  Passive shoulder flexion to 115 degrees. Limited other ROM measurements secondary to significantly increased pain.   Soft tissue mobilization provided to right deltoid region/biceps/pec minor area, well tolerated.                               PT Education - 02/10/15 1336    Education provided Yes   Education Details Provided patient with updated HEP, instruction to increase the amount of time he is using ice (4-6 times per day), and provided with a copy of JOSPT protocol for medium to large RTC repairs.    Person(s) Educated Patient   Methods Explanation;Demonstration;Verbal cues;Handout   Comprehension Verbalized understanding;Returned demonstration;Need further instruction  Patient continues to actively use RUE multiple times throughout the session.              PT Long Term Goals - 01/17/15 1550    PT LONG TERM GOAL #1   Title Patient will be independent and compliant with a home exercise program to increase his range of  motion, strength, and decrease pain in his RUE by 03/08/2015.   Status Achieved   PT LONG TERM GOAL #2   Title Patient will demonstrate [facility] PROM of his RUE in shoulder flexion, extension, internal rotation, abduciton, and external rotation to demonstrate increased function of his RUE by 03/08/2015   Status Partially Met   PT LONG TERM GOAL #3   Title Patient will report a Quick DASH score of less than 40 to demonstrate increased function and tolerance for activity with his RUE by 03/08/2015.   Status On-going   PT LONG TERM GOAL #4   Title Patient will be compliant with shoulder sling and abduction pillow until discharged by referring physician, and recall shoulder precautions during follow up visits to demonstrate safety with repair by 03/08/2015.    Status Achieved   PT LONG TERM GOAL #5   Title Patient will demonstrate WFL AROM of his RUE in all planes and directions, after clearance for AROM by physician, to demonstrate increased function and activity tolerance by 03/08/2015   Status Unable to assess   PT LONG TERM GOAL #6   Title Patient will demonstrate at least 4/5 strength in shoulder flexion, extension, external rotation, and internal rotation to demonstrate increased strength and function of RUE by 03/08/2015.   Status Unable to assess               Plan - 02/10/15 1339    Clinical Impression Statement Patient displays improved PROM today, though he continues to have pain both at rest and with active assisted ROM. Patient displays poor adherence to precautions and uses his RUE actively multiple times throughout the session, including to help himself stand after being in the prone position. Patient was able to actively complete all ther-ex provided today, with no significant increase in pain, though he needs constant reminders to allow for rest breaks. Patient will continue to benefit from skilled PT services to address his functional deficits given his right RTC repair.    Pt will  benefit from skilled therapeutic intervention in order to improve on the following deficits Decreased strength;Decreased activity tolerance;Impaired flexibility;Decreased range of motion;Pain;Impaired UE functional use   Rehab Potential Good   Clinical Impairments Affecting Rehab Potential Positive: Excellent recall of prior exercises and precautions. Negative: history of multiple RTC repairs bilaterally.    PT Frequency 2x / week   PT Duration 8 weeks   PT Treatment/Interventions ADLs/Self Care Home Management;Manual techniques;Therapeutic activities;Therapeutic exercise;Cryotherapy;Moist Heat;Neuromuscular re-education;Passive range of motion;Patient/family education;Taping   PT Next Visit Plan Continue per JOSPT protocol, re-assess pain levels, passive and AAROM. Reinforce appropriate precautions    PT Home Exercise Plan See patient instructions    Consulted and Agree with Plan of Care Patient  Problem List There are no active problems to display for this patient.   Kerman Passey, PT, DPT   02/10/2015, 1:43 PM  [place] MAIN [facility] SERVICES [address] [place], [place], [address] Phone: [phone]   Fax:  [phone]

## 2015-02-13 ENCOUNTER — Other Ambulatory Visit: Payer: Self-pay | Admitting: Family Medicine

## 2015-02-13 MED ORDER — MELOXICAM 15 MG PO TABS: 15.0000 mg | ORAL_TABLET | Freq: Every day | ORAL | Status: DC

## 2015-02-14 ENCOUNTER — Ambulatory Visit: Payer: Medicaid Other | Admitting: Physical Therapy

## 2015-02-27 ENCOUNTER — Ambulatory Visit (INDEPENDENT_AMBULATORY_CARE_PROVIDER_SITE_OTHER): Payer: Medicare Other | Admitting: Family Medicine

## 2015-02-27 ENCOUNTER — Ambulatory Visit: Payer: Medicare Other | Admitting: Family Medicine

## 2015-02-27 ENCOUNTER — Encounter: Payer: Self-pay | Admitting: Family Medicine

## 2015-02-27 VITALS — BP 140/80 | HR 102 | Resp 16 | Ht 70.0 in | Wt 250.0 lb

## 2015-02-27 DIAGNOSIS — S46901D Unspecified injury of unspecified muscle, fascia and tendon at shoulder and upper arm level, right arm, subsequent encounter: Secondary | ICD-10-CM | POA: Diagnosis not present

## 2015-02-27 MED ORDER — CYCLOBENZAPRINE HCL 10 MG PO TABS: 10.0000 mg | ORAL_TABLET | Freq: Three times a day (TID) | ORAL | Status: DC

## 2015-02-27 NOTE — Patient Instructions (Signed)
[facility]

## 2015-02-27 NOTE — Progress Notes (Signed)
Name: Bruce Richardson   MRN: [id number]    DOB: April 12, 1951   Date:02/27/2015       Progress Note  Subjective  Chief Complaint  Chief Complaint  Patient presents with  . Shoulder Pain    Right    HPI  Here for f/u of R. Shoulder pain.  Needs refill of Flexeril.  Also to check BP.  Past Medical History  Diagnosis Date  . Complication of anesthesia     woke up during colonoscopy, hard to put under.  . Diabetes mellitus without complication   . Hypertension   . Elevated lipids   . GERD (gastroesophageal reflux disease)   . Bulging lumbar disc     lower back  . Hyperlipidemia     History  Substance Use Topics  . Smoking status: Former Smoker    Start date: 12/19/2001  . Smokeless tobacco: Former Systems developer  . Alcohol Use: No     Current outpatient prescriptions:  .  cyclobenzaprine (FLEXERIL) 10 MG tablet, Take 1 tablet (10 mg total) by mouth 3 (three) times daily., Disp: 60 tablet, Rfl: 3 .  lisinopril (PRINIVIL,ZESTRIL) 40 MG tablet, Take 1 tablet (40 mg total) by mouth daily., Disp: 30 tablet, Rfl: 12 .  metFORMIN (GLUCOPHAGE) 1000 MG tablet, Take 1 tablet (1,000 mg total) by mouth 2 (two) times daily with a meal., Disp: 60 tablet, Rfl: 12 .  oxyCODONE (ROXICODONE) 5 MG immediate release tablet, Take 1-2 tablets (5-10 mg total) by mouth every 4 (four) hours as needed for severe pain., Disp: 60 tablet, Rfl: 0 .  pantoprazole (PROTONIX) 40 MG tablet, Take 1 tablet (40 mg total) by mouth daily., Disp: 30 tablet, Rfl: 12 .  simvastatin (ZOCOR) 20 MG tablet, Take 1 tablet (20 mg total) by mouth daily. Taken at supper time, Disp: 30 tablet, Rfl: 12 .  meloxicam (MOBIC) 15 MG tablet, Take 1 tablet (15 mg total) by mouth daily. (Patient not taking: Reported on 02/27/2015), Disp: 30 tablet, Rfl: 0  Allergies  Allergen Reactions  . Oxycodone Itching    Review of Systems  Constitutional: Negative.  Negative for fever, chills and malaise/fatigue.  HENT: Negative.  Negative for hearing  loss.   Eyes: Negative.  Negative for blurred vision and double vision.  Respiratory: Negative.  Negative for cough, shortness of breath and wheezing.   Cardiovascular: Negative.  Negative for chest pain, palpitations, orthopnea and leg swelling.  Gastrointestinal: Negative for abdominal pain and blood in stool.  Genitourinary: Negative for dysuria and frequency.  Musculoskeletal: Positive for joint pain (R. shoulder).  Skin: Negative.  Negative for rash.  Neurological: Negative for headaches.      Objective  Filed Vitals:   02/27/15 1412 02/27/15 1450  BP: 154/78 140/80  Pulse: 102   Resp: 16   Height: $Remove'5\' 10"'UaTenpU$  (1.778 m)   Weight: 250 lb (113.399 kg)      Physical Exam  Constitutional: He is well-developed, well-nourished, and in no distress.  HENT:  Head: Normocephalic and atraumatic.  Eyes: Conjunctivae and EOM are normal. Pupils are equal, round, and reactive to light.  Neck: Normal range of motion. Neck supple. No thyromegaly present.  Cardiovascular: Normal rate, regular rhythm, normal heart sounds and intact distal pulses.  Exam reveals no gallop and no friction rub.   No murmur heard. Pulmonary/Chest: Effort normal and breath sounds normal. No respiratory distress. He has no wheezes. He has no rales.  Musculoskeletal: He exhibits no edema.       Arms:  Lymphadenopathy:    He has no cervical adenopathy.  Vitals reviewed.     Recent Results (from the past 2160 hour(s))  CBC     Status: None   Collection Time: 12/20/14  4:10 PM  Result Value Ref Range   WBC 7.1 3.8 - 10.6 K/uL   RBC 5.36 4.40 - 5.90 MIL/uL   Hemoglobin 15.2 13.0 - 18.0 g/dL   HCT 46.5 40.0 - 52.0 %   MCV 86.7 80.0 - 100.0 fL   MCH 28.4 26.0 - 34.0 pg   MCHC 32.8 32.0 - 36.0 g/dL   RDW 12.3 11.5 - 14.5 %   Platelets 224 150 - 440 K/uL  Basic metabolic panel     Status: Abnormal   Collection Time: 12/20/14  4:10 PM  Result Value Ref Range   Sodium 140 135 - 145 mmol/L   Potassium 4.1 3.5 -  5.1 mmol/L   Chloride 105 101 - 111 mmol/L   CO2 27 22 - 32 mmol/L   Glucose, Bld 155 (H) 65 - 99 mg/dL   BUN 14 6 - 20 mg/dL   Creatinine, Ser 1.06 0.61 - 1.24 mg/dL   Calcium 9.4 8.9 - 10.3 mg/dL   GFR calc non Af Amer >60 >60 mL/min   GFR calc Af Amer >60 >60 mL/min    Comment: (NOTE) The eGFR has been calculated using the CKD EPI equation. This calculation has not been validated in all clinical situations. eGFR's persistently <90 mL/min signify possible Chronic Kidney Disease.    Anion gap 8 5 - 15  Glucose, capillary     Status: Abnormal   Collection Time: 12/22/14  8:18 AM  Result Value Ref Range   Glucose-Capillary 234 (H) 70 - 99 mg/dL   Comment 1 Notify RN   Glucose, capillary     Status: Abnormal   Collection Time: 12/22/14 12:29 PM  Result Value Ref Range   Glucose-Capillary 230 (H) 70 - 99 mg/dL     Assessment & Plan   1. Injury of tendon of upper extremity, right, subsequent encounter  - cyclobenzaprine (FLEXERIL) 10 MG tablet; Take 1 tablet (10 mg total) by mouth 3 (three) times daily.  Dispense: 60 tablet; Refill: 3

## 2015-03-01 DIAGNOSIS — Z9889 Other specified postprocedural states: Secondary | ICD-10-CM | POA: Diagnosis not present

## 2015-03-01 DIAGNOSIS — M75121 Complete rotator cuff tear or rupture of right shoulder, not specified as traumatic: Secondary | ICD-10-CM | POA: Diagnosis not present

## 2015-03-01 DIAGNOSIS — S4992XD Unspecified injury of left shoulder and upper arm, subsequent encounter: Secondary | ICD-10-CM | POA: Diagnosis not present

## 2015-03-03 DIAGNOSIS — Z9889 Other specified postprocedural states: Secondary | ICD-10-CM | POA: Diagnosis not present

## 2015-03-03 DIAGNOSIS — M75121 Complete rotator cuff tear or rupture of right shoulder, not specified as traumatic: Secondary | ICD-10-CM | POA: Diagnosis not present

## 2015-03-03 DIAGNOSIS — S4992XD Unspecified injury of left shoulder and upper arm, subsequent encounter: Secondary | ICD-10-CM | POA: Diagnosis not present

## 2015-03-07 DIAGNOSIS — M75121 Complete rotator cuff tear or rupture of right shoulder, not specified as traumatic: Secondary | ICD-10-CM | POA: Diagnosis not present

## 2015-03-07 DIAGNOSIS — Z9889 Other specified postprocedural states: Secondary | ICD-10-CM | POA: Diagnosis not present

## 2015-03-07 DIAGNOSIS — S4992XD Unspecified injury of left shoulder and upper arm, subsequent encounter: Secondary | ICD-10-CM | POA: Diagnosis not present

## 2015-03-09 DIAGNOSIS — M75121 Complete rotator cuff tear or rupture of right shoulder, not specified as traumatic: Secondary | ICD-10-CM | POA: Diagnosis not present

## 2015-03-09 DIAGNOSIS — Z9889 Other specified postprocedural states: Secondary | ICD-10-CM | POA: Diagnosis not present

## 2015-03-09 DIAGNOSIS — S4992XD Unspecified injury of left shoulder and upper arm, subsequent encounter: Secondary | ICD-10-CM | POA: Diagnosis not present

## 2015-03-14 DIAGNOSIS — S4992XD Unspecified injury of left shoulder and upper arm, subsequent encounter: Secondary | ICD-10-CM | POA: Diagnosis not present

## 2015-03-14 DIAGNOSIS — M75121 Complete rotator cuff tear or rupture of right shoulder, not specified as traumatic: Secondary | ICD-10-CM | POA: Diagnosis not present

## 2015-03-14 DIAGNOSIS — Z9889 Other specified postprocedural states: Secondary | ICD-10-CM | POA: Diagnosis not present

## 2015-03-16 DIAGNOSIS — S4992XD Unspecified injury of left shoulder and upper arm, subsequent encounter: Secondary | ICD-10-CM | POA: Diagnosis not present

## 2015-03-16 DIAGNOSIS — M75121 Complete rotator cuff tear or rupture of right shoulder, not specified as traumatic: Secondary | ICD-10-CM | POA: Diagnosis not present

## 2015-03-16 DIAGNOSIS — Z9889 Other specified postprocedural states: Secondary | ICD-10-CM | POA: Diagnosis not present

## 2015-03-21 DIAGNOSIS — M75121 Complete rotator cuff tear or rupture of right shoulder, not specified as traumatic: Secondary | ICD-10-CM | POA: Diagnosis not present

## 2015-03-21 DIAGNOSIS — Z9889 Other specified postprocedural states: Secondary | ICD-10-CM | POA: Diagnosis not present

## 2015-03-21 DIAGNOSIS — S4992XD Unspecified injury of left shoulder and upper arm, subsequent encounter: Secondary | ICD-10-CM | POA: Diagnosis not present

## 2015-04-04 DIAGNOSIS — M75121 Complete rotator cuff tear or rupture of right shoulder, not specified as traumatic: Secondary | ICD-10-CM | POA: Diagnosis not present

## 2015-04-04 DIAGNOSIS — Z9889 Other specified postprocedural states: Secondary | ICD-10-CM | POA: Diagnosis not present

## 2015-04-04 DIAGNOSIS — S4992XD Unspecified injury of left shoulder and upper arm, subsequent encounter: Secondary | ICD-10-CM | POA: Diagnosis not present

## 2015-04-06 DIAGNOSIS — M75121 Complete rotator cuff tear or rupture of right shoulder, not specified as traumatic: Secondary | ICD-10-CM | POA: Diagnosis not present

## 2015-04-06 DIAGNOSIS — S4992XD Unspecified injury of left shoulder and upper arm, subsequent encounter: Secondary | ICD-10-CM | POA: Diagnosis not present

## 2015-04-06 DIAGNOSIS — Z9889 Other specified postprocedural states: Secondary | ICD-10-CM | POA: Diagnosis not present

## 2015-04-11 ENCOUNTER — Encounter: Payer: Self-pay | Admitting: Family Medicine

## 2015-04-12 ENCOUNTER — Ambulatory Visit: Payer: Self-pay | Admitting: Family Medicine

## 2015-04-12 ENCOUNTER — Ambulatory Visit (INDEPENDENT_AMBULATORY_CARE_PROVIDER_SITE_OTHER): Payer: Medicare Other | Admitting: Family Medicine

## 2015-04-12 ENCOUNTER — Encounter: Payer: Self-pay | Admitting: Family Medicine

## 2015-04-12 VITALS — BP 134/80 | HR 101 | Resp 16 | Ht 70.0 in | Wt 238.0 lb

## 2015-04-12 DIAGNOSIS — Z0000 Encounter for general adult medical examination without abnormal findings: Secondary | ICD-10-CM | POA: Diagnosis not present

## 2015-04-12 NOTE — Progress Notes (Signed)
Patient: Bruce Richardson, Male    DOB: 01-28-1951, 64 y.o.   MRN: [id number] Visit Date: 04/12/2015  Today's Provider: Fidel Levy, MD  Chief Complaint  Patient presents with  . Medicare Wellness    welcome     Subjective:   Initial preventative physical exam Bruce Richardson is a 64 y.o. male who presents today for his Initial Preventative Physical Exam. He feels well. He reports exercising . He reports he is sleeping well.  HPI  Review of Systems  Social History   Social History  . Marital Status: Married    Spouse Name: N/A  . Number of Children: N/A  . Years of Education: N/A   Occupational History  . Not on file.   Social History Main Topics  . Smoking status: Former Smoker    Start date: 12/19/2001  . Smokeless tobacco: Former Neurosurgeon  . Alcohol Use: No  . Drug Use: No  . Sexual Activity: Not on file   Other Topics Concern  . Not on file   Social History Narrative    Patient Active Problem List   Diagnosis Date Noted  . Injury of tendon of upper extremity 12/23/2014  . Complete rotator cuff rupture of left shoulder 09/23/2014  . H/O repair of rotator cuff 08/31/2014    Past Surgical History  Procedure Laterality Date  . Shoulder arthroscopy Bilateral   . Shoulder arthroscopy Right 12/22/2014    Procedure: RIGHT SHOULDER ARTHROSCOPY /DECOMPRESSION/ROTATOR CUFF REPAIR OF RECURRENT ROTATOR CUFF TEAR;  Surgeon: Christena Flake, MD;  Location: [facility] ORS;  Service: Orthopedics;  Laterality: Right;    His family history is not on file.    Previous Medications   CYCLOBENZAPRINE (FLEXERIL) 10 MG TABLET    Take 1 tablet (10 mg total) by mouth 3 (three) times daily.   LISINOPRIL (PRINIVIL,ZESTRIL) 40 MG TABLET    Take 1 tablet (40 mg total) by mouth daily.   MELOXICAM (MOBIC) 15 MG TABLET    Take 1 tablet (15 mg total) by mouth daily.   METFORMIN (GLUCOPHAGE) 1000 MG TABLET    Take 1 tablet (1,000 mg total) by mouth 2 (two) times daily with a meal.   OXYCODONE  (ROXICODONE) 5 MG IMMEDIATE RELEASE TABLET    Take 1-2 tablets (5-10 mg total) by mouth every 4 (four) hours as needed for severe pain.   PANTOPRAZOLE (PROTONIX) 40 MG TABLET    Take 1 tablet (40 mg total) by mouth daily.   SIMVASTATIN (ZOCOR) 20 MG TABLET    Take 1 tablet (20 mg total) by mouth daily. Taken at supper time    Patient Care Team: Janeann Forehand., MD as PCP - General (Family Medicine) Janeann Forehand., MD (Family Medicine)     Objective:   Vitals: BP 134/80 mmHg  Pulse 101  Resp 16  Ht 5\' 10"  (1.778 m)  Wt 238 lb (107.956 kg)  BMI 34.15 kg/m2  Physical Exam    Hearing Screening   Method: Audiometry   125Hz  250Hz  500Hz  1000Hz  2000Hz  4000Hz  8000Hz   Right ear: Pass Pass Pass Pass Pass Pass Pass  Left ear: Pass Pass Pass Pass Pass Pass Pass  Vision Screening Comments: [facility] 02/2015  Activities of Daily Living In your present state of health, do you have any difficulty performing the following activities: 04/12/2015  Hearing? N  Vision? N  Difficulty concentrating or making decisions? N  Walking or climbing stairs? N  Dressing or bathing? N  Doing  errands, shopping? N  Preparing Food and eating ? N  Using the Toilet? N  In the past six months, have you accidently leaked urine? N  Do you have problems with loss of bowel control? N  Managing your Medications? N  Managing your Finances? N  Housekeeping or managing your Housekeeping? N  Some encounter information is confidential and restricted. Go to Review Flowsheets activity to see all data.    Fall Risk Assessment Fall Risk  04/12/2015 02/27/2015  Falls in the past year? No No     Patient reports there are safety devices in place in shower at home.   Depression Screen PHQ 2/9 Scores 04/12/2015 02/27/2015  PHQ - 2 Score 0 0    MMSE MMSE - Mini Mental State Exam 04/12/2015  Orientation to time 5  Orientation to Place 5  Registration 3  Attention/ Calculation 5  Recall 3  Language- name 2  objects 2  Language- repeat 1  Language- follow 3 step command 3  Language- read & follow direction 0  Write a sentence 0  Copy design 0  Total score 27      Assessment & Plan:     Initial Preventative Physical Exam  Reviewed patient's Family Medical History Reviewed and updated list of patient's medical providers Assessment of cognitive impairment was done Assessed patient's functional ability Established a written schedule for health screening services Health Risk Assessent Completed and Reviewed  Exercise Activities and Dietary recommendations Goals    . Exercise     Continue walking.         There is no immunization history on file for this patient.  Health Maintenance  Topic Date Due  . INFLUENZA VACCINE  11/17/2015 (Originally 03/20/2015)  . ZOSTAVAX  11/17/2015 (Originally 02/15/2010)  . TETANUS/TDAP  11/17/2015 (Originally 02/15/1969)  . PNA vac Low Risk Adult (1 of 2 - PCV13) 11/17/2015 (Originally 02/16/2015)  . COLONOSCOPY  04/18/2016 (Originally 02/16/2000)  . Hepatitis C Screening  04/18/2016 (Originally 1950-08-23)  . HIV Screening  04/18/2016 (Originally 02/15/1965)      Discussed health benefits of physical activity, and encouraged him to engage in regular exercise appropriate for his age and condition.    ------------------------------------------------------------------------------------------------------------   Problem List Items Addressed This Visit    None    Visit Diagnoses    Medicare annual wellness visit, initial    -  Primary        Venora Maples, MD [facility]  04/12/2015

## 2015-04-12 NOTE — Patient Instructions (Signed)
Health Maintenance  Topic Date Due  . Hepatitis C Screening  1950/12/22  . HIV Screening  02/15/1965  . COLONOSCOPY  02/16/2000  . INFLUENZA VACCINE  11/17/2015 (Originally 03/20/2015)  . ZOSTAVAX  11/17/2015 (Originally 02/15/2010)  . TETANUS/TDAP  11/17/2015 (Originally 02/15/1969)  . PNA vac Low Risk Adult (1 of 2 - PCV13) 11/17/2015 (Originally 02/16/2015)

## 2015-04-18 DIAGNOSIS — Z9889 Other specified postprocedural states: Secondary | ICD-10-CM | POA: Diagnosis not present

## 2015-04-18 DIAGNOSIS — M75121 Complete rotator cuff tear or rupture of right shoulder, not specified as traumatic: Secondary | ICD-10-CM | POA: Diagnosis not present

## 2015-04-18 DIAGNOSIS — S4992XD Unspecified injury of left shoulder and upper arm, subsequent encounter: Secondary | ICD-10-CM | POA: Diagnosis not present

## 2015-04-19 ENCOUNTER — Other Ambulatory Visit: Payer: Self-pay | Admitting: Family Medicine

## 2015-04-19 MED ORDER — GLUCOSE BLOOD VI STRP: ORAL_STRIP | Status: DC

## 2015-04-19 MED ORDER — FREESTYLE LANCETS MISC: Status: DC

## 2015-04-19 MED ORDER — FREESTYLE SYSTEM KIT: 1.0000 | PACK | Freq: Two times a day (BID) | Status: DC

## 2015-04-21 ENCOUNTER — Other Ambulatory Visit: Payer: Self-pay | Admitting: *Deleted

## 2015-04-26 DIAGNOSIS — M778 Other enthesopathies, not elsewhere classified: Secondary | ICD-10-CM

## 2015-04-26 DIAGNOSIS — IMO0002 Reserved for concepts with insufficient information to code with codable children: Secondary | ICD-10-CM | POA: Insufficient documentation

## 2015-04-26 DIAGNOSIS — Z9889 Other specified postprocedural states: Secondary | ICD-10-CM | POA: Diagnosis not present

## 2015-04-26 DIAGNOSIS — M75121 Complete rotator cuff tear or rupture of right shoulder, not specified as traumatic: Secondary | ICD-10-CM | POA: Diagnosis not present

## 2015-04-26 DIAGNOSIS — S4992XD Unspecified injury of left shoulder and upper arm, subsequent encounter: Secondary | ICD-10-CM | POA: Diagnosis not present

## 2015-04-26 HISTORY — DX: Reserved for concepts with insufficient information to code with codable children: IMO0002

## 2015-04-26 HISTORY — DX: Other enthesopathies, not elsewhere classified: M77.8

## 2015-04-27 DIAGNOSIS — S4992XD Unspecified injury of left shoulder and upper arm, subsequent encounter: Secondary | ICD-10-CM | POA: Diagnosis not present

## 2015-04-27 DIAGNOSIS — M75121 Complete rotator cuff tear or rupture of right shoulder, not specified as traumatic: Secondary | ICD-10-CM | POA: Diagnosis not present

## 2015-04-27 DIAGNOSIS — M778 Other enthesopathies, not elsewhere classified: Secondary | ICD-10-CM | POA: Diagnosis not present

## 2015-04-27 DIAGNOSIS — Z9889 Other specified postprocedural states: Secondary | ICD-10-CM | POA: Diagnosis not present

## 2015-06-28 ENCOUNTER — Ambulatory Visit: Payer: No Typology Code available for payment source | Admitting: Family Medicine

## 2015-07-04 ENCOUNTER — Encounter: Payer: Self-pay | Admitting: Family Medicine

## 2015-07-04 ENCOUNTER — Ambulatory Visit (INDEPENDENT_AMBULATORY_CARE_PROVIDER_SITE_OTHER): Payer: Medicare Other | Admitting: Family Medicine

## 2015-07-04 DIAGNOSIS — I10 Essential (primary) hypertension: Secondary | ICD-10-CM | POA: Diagnosis not present

## 2015-07-04 DIAGNOSIS — IMO0001 Reserved for inherently not codable concepts without codable children: Secondary | ICD-10-CM

## 2015-07-04 DIAGNOSIS — E118 Type 2 diabetes mellitus with unspecified complications: Secondary | ICD-10-CM | POA: Insufficient documentation

## 2015-07-04 DIAGNOSIS — E1169 Type 2 diabetes mellitus with other specified complication: Secondary | ICD-10-CM | POA: Insufficient documentation

## 2015-07-04 DIAGNOSIS — E1165 Type 2 diabetes mellitus with hyperglycemia: Secondary | ICD-10-CM

## 2015-07-04 DIAGNOSIS — E785 Hyperlipidemia, unspecified: Secondary | ICD-10-CM

## 2015-07-04 DIAGNOSIS — N478 Other disorders of prepuce: Secondary | ICD-10-CM

## 2015-07-04 HISTORY — DX: Other disorders of prepuce: N47.8

## 2015-07-04 LAB — POCT GLYCOSYLATED HEMOGLOBIN (HGB A1C): Hemoglobin A1C: 11.6

## 2015-07-04 LAB — GLUCOSE, POCT (MANUAL RESULT ENTRY): POC Glucose: 313 mg/dl — AB (ref 70–99)

## 2015-07-04 MED ORDER — LIRAGLUTIDE 18 MG/3ML ~~LOC~~ SOPN: PEN_INJECTOR | SUBCUTANEOUS | Status: DC

## 2015-07-04 NOTE — Progress Notes (Signed)
Name: Bruce Richardson   MRN: [id number]    DOB: Mar 27, 1951   Date:07/04/2015       Progress Note  Subjective  Chief Complaint  Chief Complaint  Patient presents with  . Establish Care    NP  . Diabetes    Diabetes He presents for his follow-up diabetic visit. He has type 2 diabetes mellitus. His disease course has been worsening. There are no hypoglycemic associated symptoms. Pertinent negatives for hypoglycemia include no headaches. Associated symptoms include blurred vision (sometimes vision in right eye gets blurry). Pertinent negatives for diabetes include no chest pain, no fatigue, no foot paresthesias, no polydipsia and no polyuria. Pertinent negatives for diabetic complications include no CVA or heart disease. Risk factors for coronary artery disease include dyslipidemia, male sex, obesity, hypertension and diabetes mellitus. Current diabetic treatment includes oral agent (monotherapy). He is following a generally unhealthy ( Eats 'whatever', including fried foods, sweets) diet. Frequency home blood tests: does not check his BG daily. (Today's reading was 313 in office)  Hyperlipidemia This is a chronic problem. Condition status: Previous PCP did not check lipids. Exacerbating diseases include diabetes and obesity. Pertinent negatives include no chest pain, leg pain, myalgias or shortness of breath. Current antihyperlipidemic treatment includes statins.  Hypertension This is a chronic problem. The problem is controlled. Associated symptoms include blurred vision (sometimes vision in right eye gets blurry). Pertinent negatives include no chest pain, headaches, palpitations or shortness of breath. Risk factors for coronary artery disease include dyslipidemia, obesity and male gender. Past treatments include ACE inhibitors. There are no compliance problems.  There is no history of kidney disease, CAD/MI or CVA.   Penile Foreskin Tightening Pt. With concern about tightening of the foreskin,  difficulty with pulling the foreskin back after retracting, he states (I used to catch it in my zipper... ). No penile discharge.   Past Medical History  Diagnosis Date  . Complication of anesthesia     kept moving while under anesthesia during colonoscopy, hard to put under.  . Diabetes mellitus without complication ([place])   . Hypertension   . Elevated lipids   . GERD (gastroesophageal reflux disease)   . Bulging lumbar disc     lower back  . Hyperlipidemia     Past Surgical History  Procedure Laterality Date  . Shoulder arthroscopy Bilateral   . Shoulder arthroscopy Right 12/22/2014    Procedure: RIGHT SHOULDER ARTHROSCOPY /DECOMPRESSION/ROTATOR CUFF REPAIR OF RECURRENT ROTATOR CUFF TEAR;  Surgeon: Corky Mull, MD;  Location: [facility] ORS;  Service: Orthopedics;  Laterality: Right;    Family History  Problem Relation Age of Onset  . Alzheimer's disease Mother   . Cancer Father   . Heart failure Brother     Social History   Social History  . Marital Status: Married    Spouse Name: N/A  . Number of Children: N/A  . Years of Education: N/A   Occupational History  . Not on file.   Social History Main Topics  . Smoking status: Former Smoker -- 1.50 packs/day    Types: Cigarettes    Start date: 12/19/2001    Quit date: 07/03/2002  . Smokeless tobacco: Former Systems developer  . Alcohol Use: No  . Drug Use: No  . Sexual Activity: Not on file   Other Topics Concern  . Not on file   Social History Narrative     Current outpatient prescriptions:  .  ACCU-CHEK AVIVA PLUS test strip, 2 (two) times daily. for testing, Disp: ,  Rfl: 1 .  ACCU-CHEK SOFTCLIX LANCETS lancets, 2 (two) times daily. for testing, Disp: , Rfl: 1 .  Blood Glucose Monitoring Suppl (ACCU-CHEK AVIVA PLUS) W/DEVICE KIT, use as directed twice a day BEFORE LUNCH AND SUPPER, Disp: , Rfl: 0 .  cyclobenzaprine (FLEXERIL) 10 MG tablet, Take 1 tablet (10 mg total) by mouth 3 (three) times daily., Disp: 60 tablet, Rfl: 3 .   lisinopril (PRINIVIL,ZESTRIL) 40 MG tablet, Take 1 tablet (40 mg total) by mouth daily., Disp: 30 tablet, Rfl: 12 .  metFORMIN (GLUCOPHAGE) 1000 MG tablet, Take 1 tablet (1,000 mg total) by mouth 2 (two) times daily with a meal., Disp: 60 tablet, Rfl: 12 .  oxyCODONE (ROXICODONE) 5 MG immediate release tablet, Take 1-2 tablets (5-10 mg total) by mouth every 4 (four) hours as needed for severe pain., Disp: 60 tablet, Rfl: 0 .  pantoprazole (PROTONIX) 40 MG tablet, Take 1 tablet (40 mg total) by mouth daily., Disp: 30 tablet, Rfl: 12 .  simvastatin (ZOCOR) 20 MG tablet, Take 1 tablet (20 mg total) by mouth daily. Taken at supper time, Disp: 30 tablet, Rfl: 12  Allergies  Allergen Reactions  . Oxycodone Itching    Review of Systems  Constitutional: Negative for fatigue.  Eyes: Positive for blurred vision (sometimes vision in right eye gets blurry).  Respiratory: Negative for shortness of breath.   Cardiovascular: Negative for chest pain and palpitations.  Genitourinary: Negative for dysuria.  Musculoskeletal: Negative for myalgias.  Neurological: Negative for headaches.  Endo/Heme/Allergies: Negative for polydipsia.    Objective  Filed Vitals:   07/04/15 1003  BP: 128/80  Pulse: 107  Temp: 98.9 F (37.2 C)  TempSrc: Oral  Resp: 18  Height: $Remove'5\' 10"'eNLFigF$  (1.778 m)  Weight: 239 lb 6.4 oz (108.591 kg)  SpO2: 96%    Physical Exam  Constitutional: He is oriented to person, place, and time and well-developed, well-nourished, and in no distress.  HENT:  Head: Normocephalic and atraumatic.  Eyes: Conjunctivae are normal. Pupils are equal, round, and reactive to light.  Neck: Neck supple.  Cardiovascular: Normal rate, regular rhythm and normal heart sounds.  Exam reveals no gallop and no friction rub.   No murmur heard. Pulmonary/Chest: Effort normal and breath sounds normal. He has no wheezes. He has no rales.  Abdominal: Soft. Bowel sounds are normal. He exhibits no distension.   Genitourinary: Penis normal. Penis exhibits no lesions and no edema. No discharge found.  Foreskin tightening, difficulty with pulling the foreskin back after retraction, cracks in the distal foreskin, no discharge, whitish debris within the folds of the foreskin  Neurological: He is alert and oriented to person, place, and time.  Skin: Skin is warm and dry.  Psychiatric: Mood, memory, affect and judgment normal.  Nursing note and vitals reviewed.   Recent Results (from the past 2160 hour(s))  POCT HgB A1C     Status: Abnormal   Collection Time: 07/04/15 10:17 AM  Result Value Ref Range   Hemoglobin A1C 11.6   POCT Glucose (CBG)     Status: Abnormal   Collection Time: 07/04/15 10:17 AM  Result Value Ref Range   POC Glucose 313 (A) 70 - 99 mg/dl     Assessment & Plan  1. Essential hypertension BP stable on present therapy.  2. Dyslipidemia We will obtain FLP and liver enzymes and start patient on statin therapy for primary prevention. - Lipid Profile - Comprehensive Metabolic Panel (CMET)  3. Uncontrolled type 2 diabetes mellitus without complication, without  long-term current use of insulin (HCC) Worse and uncontrolled with A1c of 11.6%, consistent with poorly controlled diabetes. Started patient on the toes up subcutaneous once daily. Patient has reportedly been on an injectable medication (not sure if it was insulin) by previous PCP. We will request records for review. - POCT HgB A1C - POCT Glucose (CBG) - Ambulatory referral to Ophthalmology - Liraglutide 18 MG/3ML SOPN; 0.6 mg [place] qd x 1 wk, then 1.2 mg [place] qd.  Dispense: 6 mL; Refill: 2 - Urine Microalbumin w/creat. ratio  4. Non-retracting foreskin  Referral to urology for consideration of circumcision. - Ambulatory referral to Urology    More than 50% of visit was spent on counseling, and coordination of care for pt.  Chauncey Sciulli [facility] 07/04/2015 10:38  AM

## 2015-07-05 DIAGNOSIS — E785 Hyperlipidemia, unspecified: Secondary | ICD-10-CM | POA: Diagnosis not present

## 2015-07-06 LAB — COMPREHENSIVE METABOLIC PANEL
ALT: 22 IU/L (ref 0–44)
AST: 17 IU/L (ref 0–40)
Albumin/Globulin Ratio: 1.4 (ref 1.1–2.5)
Albumin: 4.2 g/dL (ref 3.6–4.8)
Alkaline Phosphatase: 76 IU/L (ref 39–117)
BUN/Creatinine Ratio: 16 (ref 10–22)
BUN: 16 mg/dL (ref 8–27)
Bilirubin Total: 0.9 mg/dL (ref 0.0–1.2)
CO2: 24 mmol/L (ref 18–29)
Calcium: 10.3 mg/dL — ABNORMAL HIGH (ref 8.6–10.2)
Chloride: 98 mmol/L (ref 97–106)
Creatinine, Ser: 1.02 mg/dL (ref 0.76–1.27)
GFR calc Af Amer: 89 mL/min/{1.73_m2} (ref >59)
GFR calc non Af Amer: 77 mL/min/{1.73_m2} (ref >59)
Globulin, Total: 2.9 g/dL (ref 1.5–4.5)
Glucose: 296 mg/dL — ABNORMAL HIGH (ref 65–99)
Potassium: 5.3 mmol/L — ABNORMAL HIGH (ref 3.5–5.2)
Sodium: 141 mmol/L (ref 136–144)
Total Protein: 7.1 g/dL (ref 6.0–8.5)

## 2015-07-06 LAB — LIPID PANEL
Chol/HDL Ratio: 6.3 ratio units — ABNORMAL HIGH (ref 0.0–5.0)
Cholesterol, Total: 189 mg/dL (ref 100–199)
HDL: 30 mg/dL — ABNORMAL LOW (ref >39)
LDL Calculated: 126 mg/dL — ABNORMAL HIGH (ref 0–99)
Triglycerides: 167 mg/dL — ABNORMAL HIGH (ref 0–149)
VLDL Cholesterol Cal: 33 mg/dL (ref 5–40)

## 2015-07-06 LAB — MICROALBUMIN / CREATININE URINE RATIO
Creatinine, Urine: 119.9 mg/dL
MICROALB/CREAT RATIO: 95.8 mg/g creat — ABNORMAL HIGH (ref 0.0–30.0)
Microalbumin, Urine: 114.9 ug/mL

## 2015-07-06 LAB — COMMENT

## 2015-07-10 ENCOUNTER — Telehealth: Payer: Self-pay | Admitting: Family Medicine

## 2015-07-10 DIAGNOSIS — E1165 Type 2 diabetes mellitus with hyperglycemia: Principal | ICD-10-CM

## 2015-07-10 DIAGNOSIS — IMO0001 Reserved for inherently not codable concepts without codable children: Secondary | ICD-10-CM

## 2015-07-10 MED ORDER — PEN NEEDLES 32G X 4 MM MISC
1.0000 | Status: DC
Start: 2015-07-10 — End: 2019-04-02

## 2015-07-10 NOTE — Telephone Encounter (Signed)
Pt states we received a fax for his needles for his Victoza. Pt uses [facility] in [place] and he needs to get those. He is has'nt even been able to use his Victoza because he is waiting on the needles to be called in.

## 2015-07-10 NOTE — Telephone Encounter (Signed)
Medication has been refilled and sent to [facility] [place] 

## 2015-07-11 ENCOUNTER — Encounter: Payer: Self-pay | Admitting: Urology

## 2015-07-11 ENCOUNTER — Ambulatory Visit (INDEPENDENT_AMBULATORY_CARE_PROVIDER_SITE_OTHER): Payer: Medicare Other | Admitting: Urology

## 2015-07-11 VITALS — BP 156/82 | HR 106 | Ht 70.0 in | Wt 236.5 lb

## 2015-07-11 DIAGNOSIS — N478 Other disorders of prepuce: Secondary | ICD-10-CM | POA: Diagnosis not present

## 2015-07-11 NOTE — Progress Notes (Signed)
07/11/2015 3:21 PM   Bruce Richardson 12-09-1950 [id number]  Referring provider: Roselee Nova, MD [address] [place], [place] [address]  Chief Complaint  Patient presents with  . Paraphimosis    New Patient    HPI:  1 - Phimosis - pt with progressive bother from tightening of foreskin / phimotic ring. Can still retract to void, but just barely. 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 He is very bothered.   Today "Bruce Richardson" is seen for opinion on phimosis.    PMH: Past Medical History  Diagnosis Date  . Complication of anesthesia     kept moving while under anesthesia during colonoscopy, hard to put under.  . Diabetes mellitus without complication ([place])   . Hypertension   . Elevated lipids   . GERD (gastroesophageal reflux disease)   . Bulging lumbar disc     lower back  . Hyperlipidemia     Surgical History: Past Surgical History  Procedure Laterality Date  . Shoulder arthroscopy Bilateral   . Shoulder arthroscopy Right 12/22/2014    Procedure: RIGHT SHOULDER ARTHROSCOPY /DECOMPRESSION/ROTATOR CUFF REPAIR OF RECURRENT ROTATOR CUFF TEAR;  Surgeon: Corky Mull, MD;  Location: [facility] ORS;  Service: Orthopedics;  Laterality: Right;    Home Medications:    Medication List       This list is accurate as of: 07/11/15  3:21 PM.  Always use your most recent med list.               ACCU-CHEK AVIVA PLUS test strip  Generic drug:  glucose blood  2 (two) times daily. for testing     ACCU-CHEK AVIVA PLUS W/DEVICE Kit  use as directed twice a day BEFORE LUNCH AND SUPPER     ACCU-CHEK SOFTCLIX LANCETS lancets  2 (two) times daily. for testing     cyclobenzaprine 10 MG tablet  Commonly known as:  FLEXERIL  Take 1 tablet (10 mg total) by mouth 3 (three) times daily.     Liraglutide 18 MG/3ML Sopn  0.6 mg [place] qd x 1 wk, then 1.2 mg [place] qd.     lisinopril 40 MG tablet  Commonly known as:  PRINIVIL,ZESTRIL  Take 1 tablet (40 mg total) by mouth daily.     metFORMIN 1000 MG tablet  Commonly known as:  GLUCOPHAGE  Take 1 tablet (1,000 mg total) by mouth 2 (two) times daily with a meal.     oxyCODONE 5 MG immediate release tablet  Commonly known as:  ROXICODONE  Take 1-2 tablets (5-10 mg total) by mouth every 4 (four) hours as needed for severe pain.     pantoprazole 40 MG tablet  Commonly known as:  PROTONIX  Take 1 tablet (40 mg total) by mouth daily.     Pen Needles 32G X 4 MM Misc  1 Device by Does not apply route every 7 (seven) days.     simvastatin 20 MG tablet  Commonly known as:  ZOCOR  Take 1 tablet (20 mg total) by mouth daily. Taken at supper time        Allergies:  Allergies  Allergen Reactions  . Oxycodone Itching    Family History: Family History  Problem Relation Age of Onset  . Alzheimer's disease Mother   . Cancer Father   . Heart failure Brother   . Prostate cancer Other     unknown  . Bladder Cancer Other     unknown    Social History:  reports that  he quit smoking about 13 years ago. His smoking use included Cigarettes. He started smoking about 13 years ago. He smoked 1.50 packs per day. He has quit using smokeless tobacco. He reports that he does not drink alcohol or use illicit drugs.  ROS: UROLOGY Frequent Urination?: Yes Hard to postpone urination?: No Burning/pain with urination?: No Get up at night to urinate?: Yes Leakage of urine?: No Urine stream starts and stops?: No Trouble starting stream?: No Do you have to strain to urinate?: No Blood in urine?: No Urinary tract infection?: No Sexually transmitted disease?: No Injury to kidneys or bladder?: No Painful intercourse?: No Weak stream?: No Erection problems?: No Penile pain?: No  Gastrointestinal Nausea?: No Vomiting?: No Indigestion/heartburn?: No Diarrhea?: No Constipation?: No  Constitutional Fever: No Night sweats?: Yes Weight loss?: No Fatigue?: No  Skin Skin rash/lesions?: No Itching?: No  Eyes Blurred  vision?: No Double vision?: No  Ears/Nose/Throat Sore throat?: No Sinus problems?: Yes  Hematologic/Lymphatic Swollen glands?: No Easy bruising?: No  Cardiovascular Leg swelling?: No Chest pain?: No  Respiratory Cough?: No Shortness of breath?: No  Endocrine Excessive thirst?: No  Musculoskeletal Back pain?: Yes Joint pain?: Yes  Neurological Headaches?: No Dizziness?: Yes  Psychologic Depression?: No Anxiety?: No  Physical Exam: BP 156/82 mmHg  Pulse 106  Ht _0  (1.778 m)  Wt 236 lb 8 oz (107.276 kg)  BMI 33.93 kg/m2  Constitutional:  Alert and oriented, No acute distress. HEENT:  AT, moist mucus membranes.  Trachea midline, no masses. Cardiovascular: No clubbing, cyanosis, or edema. Respiratory: Normal respiratory effort, no increased work of breathing. GI: Abdomen is soft, nontender, nondistended, no abdominal masses GU: No CVA tenderness.UNcircumcised with tender, red phimotic ring that is just barely reducible. No worisome glans / foreskin lesions. No active purlulent infection.  Skin: No rashes, bruises or suspicious lesions. Lymph: No cervical or inguinal adenopathy. Neurologic: Grossly intact, no focal deficits, moving all 4 extremities. Psychiatric: Normal mood and affect.  Laboratory Data: Lab Results  Component Value Date   WBC 7.1 12/20/2014   HGB 15.2 12/20/2014   HCT 46.5 12/20/2014   MCV 86.7 12/20/2014   PLT 224 12/20/2014    Lab Results  Component Value Date   CREATININE 1.02 07/05/2015    No results found for: PSA  No results found for: TESTOSTERONE  Lab Results  Component Value Date   HGBA1C 11.6 07/04/2015    Urinalysis    Component Value Date/Time   COLORURINE Yellow 12/29/2012 2143   APPEARANCEUR Clear 12/29/2012 2143   LABSPEC 1.024 12/29/2012 2143   PHURINE 5.0 12/29/2012 2143   GLUCOSEU Negative 12/29/2012 2143   HGBUR 1+ 12/29/2012 2143   BILIRUBINUR Negative 12/29/2012 2143   KETONESUR Negative  12/29/2012 2143   PROTEINUR 30 mg/dL 12/29/2012 2143   NITRITE Negative 12/29/2012 2143   LEUKOCYTESUR Negative 12/29/2012 2143    Assessment & Plan:    1 - Phimosis - discussed natural history and that his diabetes increases risk of progression. Management options including topical steroid / antifungal v. Dorsal slit v. circ discussed. Pt wants to proceed with circumcision next avail. Rec in OR given sig phimotic ring. Periop course, risks, (taking too much skin, change in sexual sensation, others) and benefits discussed. Will arrange. Encouraged good glycemic control to minimize infectious problems in peri-op setting.  2 - RTC 5-7 days post-circ for wound check, then prn if stable.      No Follow-up on file.  Alexis Frock, [facility] [address], [place], [place] [address] [phone]

## 2015-07-12 ENCOUNTER — Ambulatory Visit: Payer: No Typology Code available for payment source | Admitting: Family Medicine

## 2015-07-17 ENCOUNTER — Other Ambulatory Visit: Payer: Medicaid Other

## 2015-07-17 ENCOUNTER — Telehealth: Payer: Self-pay | Admitting: Radiology

## 2015-07-17 NOTE — Telephone Encounter (Signed)
Pt notified of surgery scheduled 07/24/15, pre-admit testing appt on 07/18/15 @10 :45 and to call Friday prior to surgery for arrival time to SDS. Pt voices understanding.

## 2015-07-18 ENCOUNTER — Encounter
Admission: RE | Admit: 2015-07-18 | Discharge: 2015-07-18 | Disposition: A | Discharge status: Home / Self Care | Payer: Medicare Other | Source: Ambulatory Visit | Attending: Urology | Admitting: Urology

## 2015-07-18 DIAGNOSIS — I10 Essential (primary) hypertension: Secondary | ICD-10-CM | POA: Diagnosis not present

## 2015-07-18 DIAGNOSIS — Z01818 Encounter for other preprocedural examination: Secondary | ICD-10-CM | POA: Insufficient documentation

## 2015-07-18 DIAGNOSIS — Z01812 Encounter for preprocedural laboratory examination: Secondary | ICD-10-CM | POA: Insufficient documentation

## 2015-07-18 HISTORY — DX: Unspecified osteoarthritis, unspecified site: M19.90

## 2015-07-18 LAB — BASIC METABOLIC PANEL
Anion gap: 5 (ref 5–15)
BUN: 18 mg/dL (ref 6–20)
CO2: 27 mmol/L (ref 22–32)
Calcium: 9.2 mg/dL (ref 8.9–10.3)
Chloride: 104 mmol/L (ref 101–111)
Creatinine, Ser: 0.87 mg/dL (ref 0.61–1.24)
GFR calc Af Amer: >60 mL/min (ref >60)
GFR calc non Af Amer: >60 mL/min (ref >60)
Glucose, Bld: 158 mg/dL — ABNORMAL HIGH (ref 65–99)
Potassium: 4.7 mmol/L (ref 3.5–5.1)
Sodium: 136 mmol/L (ref 135–145)

## 2015-07-18 NOTE — Patient Instructions (Signed)
  Your procedure is scheduled on: July 24, 2015 (Monday) Report to Day Surgery.[facility]) Second Floor To find out your arrival time please call [phone] between 1PM - 3PM on April 21, 2015 (Friday).  Remember: Instructions that are not followed completely may result in serious medical risk, up to and including death, or upon the discretion of your surgeon and anesthesiologist your surgery may need to be rescheduled.    _x___ 1. Do not eat food or drink liquids after midnight. No gum chewing or hard candies.     ____ 2. No Alcohol for 24 hours before or after surgery.   ____ 3. Bring all medications with you on the day of surgery if instructed.    __x__ 4. Notify your doctor if there is any change in your medical condition     (cold, fever, infections).     Do not wear jewelry, make-up, hairpins, clips or nail polish.  Do not wear lotions, powders, or perfumes. You may wear deodorant.  Do not shave 48 hours prior to surgery. Men may shave face and neck.  Do not bring valuables to the hospital.    [facility] is not responsible for any belongings or valuables.               Contacts, dentures or bridgework may not be worn into surgery.  Leave your suitcase in the car. After surgery it may be brought to your room.  For patients admitted to the hospital, discharge time is determined by your                treatment team.   Patients discharged the day of surgery will not be allowed to drive home.   Please read over the following fact sheets that you were given:      __x__ Take these medicines the morning of surgery with A SIP OF WATER:    1. Lisinopril  2. Protonix  3.   4.  5.  6.  ____ Fleet Enema (as directed)   ____ Use CHG Soap as directed  ____ Use inhalers on the day of surgery  ____ Stop metformin 2 days prior to surgery    ____ Take 1/2 of usual insulin dose the night before surgery and none on the morning of surgery.   ____ Stop  Coumadin/Plavix/aspirin on   ____ Stop Anti-inflammatories on    ____ Stop supplements until after surgery.    ____ Bring C-Pap to the hospital.

## 2015-07-19 ENCOUNTER — Ambulatory Visit: Payer: Medicare Other | Admitting: Family Medicine

## 2015-07-24 ENCOUNTER — Ambulatory Visit: Payer: Medicare Other | Admitting: Anesthesiology

## 2015-07-24 ENCOUNTER — Ambulatory Visit
Admission: RE | Admit: 2015-07-24 | Discharge: 2015-07-24 | Disposition: A | Discharge status: Home / Self Care | Payer: Medicare Other | Source: Ambulatory Visit | Attending: Urology | Admitting: Urology

## 2015-07-24 ENCOUNTER — Encounter: Payer: Self-pay | Admitting: *Deleted

## 2015-07-24 ENCOUNTER — Encounter
Admission: RE | Disposition: A | Discharge status: Home / Self Care | Payer: Self-pay | Source: Ambulatory Visit | Attending: Urology

## 2015-07-24 DIAGNOSIS — E785 Hyperlipidemia, unspecified: Secondary | ICD-10-CM | POA: Insufficient documentation

## 2015-07-24 DIAGNOSIS — K219 Gastro-esophageal reflux disease without esophagitis: Secondary | ICD-10-CM | POA: Diagnosis not present

## 2015-07-24 DIAGNOSIS — E119 Type 2 diabetes mellitus without complications: Secondary | ICD-10-CM | POA: Insufficient documentation

## 2015-07-24 DIAGNOSIS — I10 Essential (primary) hypertension: Secondary | ICD-10-CM | POA: Diagnosis not present

## 2015-07-24 DIAGNOSIS — Z87891 Personal history of nicotine dependence: Secondary | ICD-10-CM | POA: Diagnosis not present

## 2015-07-24 DIAGNOSIS — Z885 Allergy status to narcotic agent status: Secondary | ICD-10-CM | POA: Insufficient documentation

## 2015-07-24 DIAGNOSIS — N472 Paraphimosis: Secondary | ICD-10-CM | POA: Insufficient documentation

## 2015-07-24 DIAGNOSIS — N471 Phimosis: Secondary | ICD-10-CM

## 2015-07-24 DIAGNOSIS — Z7984 Long term (current) use of oral hypoglycemic drugs: Secondary | ICD-10-CM | POA: Diagnosis not present

## 2015-07-24 HISTORY — PX: CIRCUMCISION: SHX1350

## 2015-07-24 LAB — GLUCOSE, CAPILLARY
Glucose-Capillary: 170 mg/dL — ABNORMAL HIGH (ref 65–99)
Glucose-Capillary: 144 mg/dL — ABNORMAL HIGH (ref 65–99)

## 2015-07-24 SURGERY — CIRCUMCISION, ADULT
Anesthesia: General | Wound class: Clean Contaminated

## 2015-07-24 MED ORDER — WHITE PETROLATUM GEL
Status: AC
  Filled 2015-07-24: qty 5

## 2015-07-24 MED ORDER — ROCURONIUM BROMIDE 100 MG/10ML IV SOLN
INTRAVENOUS | Status: DC | PRN
  Administered 2015-07-24: 25 mg via INTRAVENOUS

## 2015-07-24 MED ORDER — FENTANYL CITRATE (PF) 100 MCG/2ML IJ SOLN
INTRAMUSCULAR | Status: DC | PRN
  Administered 2015-07-24: 100 ug via INTRAVENOUS

## 2015-07-24 MED ORDER — LIDOCAINE HCL (CARDIAC) 20 MG/ML IV SOLN
INTRAVENOUS | Status: DC | PRN
  Administered 2015-07-24 (×2): 100 mg via INTRAVENOUS

## 2015-07-24 MED ORDER — BUPIVACAINE HCL (PF) 0.5 % IJ SOLN
INTRAMUSCULAR | Status: AC
  Filled 2015-07-24: qty 30

## 2015-07-24 MED ORDER — EPHEDRINE SULFATE 50 MG/ML IJ SOLN
INTRAMUSCULAR | Status: DC | PRN
  Administered 2015-07-24: 10 mg via INTRAVENOUS

## 2015-07-24 MED ORDER — KETAMINE HCL 10 MG/ML IJ SOLN
INTRAMUSCULAR | Status: DC | PRN
  Administered 2015-07-24: 50 mg via INTRAVENOUS

## 2015-07-24 MED ORDER — CLINDAMYCIN PHOSPHATE 900 MG/50ML IV SOLN
900.0000 mg | Freq: Once | INTRAVENOUS | Status: AC
  Administered 2015-07-24: 900 mg via INTRAVENOUS

## 2015-07-24 MED ORDER — ACETAMINOPHEN 10 MG/ML IV SOLN
INTRAVENOUS | Status: AC
  Filled 2015-07-24: qty 100

## 2015-07-24 MED ORDER — PROMETHAZINE HCL 25 MG/ML IJ SOLN: 6.2500 mg | INTRAMUSCULAR | Status: DC | PRN

## 2015-07-24 MED ORDER — ONDANSETRON HCL 4 MG/2ML IJ SOLN
INTRAMUSCULAR | Status: DC | PRN
  Administered 2015-07-24: 4 mg via INTRAVENOUS

## 2015-07-24 MED ORDER — OXYCODONE HCL 5 MG PO TABS
5.0000 mg | ORAL_TABLET | ORAL | Status: DC | PRN
Start: 2015-07-24 — End: 2015-10-10

## 2015-07-24 MED ORDER — ACETAMINOPHEN 10 MG/ML IV SOLN
INTRAVENOUS | Status: DC | PRN
  Administered 2015-07-24: 1000 mg via INTRAVENOUS

## 2015-07-24 MED ORDER — PROPOFOL 10 MG/ML IV BOLUS
INTRAVENOUS | Status: DC | PRN
  Administered 2015-07-24: 200 mg via INTRAVENOUS

## 2015-07-24 MED ORDER — BUPIVACAINE HCL (PF) 0.5 % IJ SOLN
INTRAMUSCULAR | Status: DC | PRN
  Administered 2015-07-24: 7 mL
  Administered 2015-07-24: 10 mL

## 2015-07-24 MED ORDER — OXYCODONE HCL 5 MG PO TABS: 5.0000 mg | ORAL_TABLET | ORAL | Status: DC | PRN

## 2015-07-24 MED ORDER — FENTANYL CITRATE (PF) 100 MCG/2ML IJ SOLN: 25.0000 ug | INTRAMUSCULAR | Status: DC | PRN

## 2015-07-24 MED ORDER — MIDAZOLAM HCL 2 MG/2ML IJ SOLN
INTRAMUSCULAR | Status: DC | PRN
  Administered 2015-07-24: 1 mg via INTRAVENOUS

## 2015-07-24 MED ORDER — SODIUM CHLORIDE 0.9 % IV SOLN
INTRAVENOUS | Status: DC
  Administered 2015-07-24: 14:00:00 via INTRAVENOUS

## 2015-07-24 MED ORDER — CEPHALEXIN 500 MG PO CAPS: 500.0000 mg | ORAL_CAPSULE | Freq: Three times a day (TID) | ORAL | Status: DC

## 2015-07-24 SURGICAL SUPPLY — 23 items
BACTOSHIELD CHG 4% 4OZ (MISCELLANEOUS) ×1
BLADE SURG 15 STRL LF DISP TIS (BLADE) ×1 IMPLANT
BLADE SURG 15 STRL SS (BLADE) ×1
CANISTER SUCT 1200ML W/VALVE (MISCELLANEOUS) ×2 IMPLANT
DRAPE LAPAROTOMY 77X122 PED (DRAPES) ×2 IMPLANT
ELECT CAUTERY NEEDLE TIP 1.0 (MISCELLANEOUS) ×2
ELECTRODE CAUTERY NEDL TIP 1.0 (MISCELLANEOUS) ×1 IMPLANT
GAUZE PETROLATUM 1 X8 (GAUZE/BANDAGES/DRESSINGS) ×2 IMPLANT
GAUZE STRETCH 2X75IN STRL (MISCELLANEOUS) ×2 IMPLANT
GLOVE BIO SURGEON STRL SZ7.5 (GLOVE) ×2 IMPLANT
GOWN STRL REUS W/ TWL LRG LVL3 (GOWN DISPOSABLE) ×1 IMPLANT
GOWN STRL REUS W/ TWL XL LVL3 (GOWN DISPOSABLE) ×1 IMPLANT
GOWN STRL REUS W/TWL LRG LVL3 (GOWN DISPOSABLE) ×1
GOWN STRL REUS W/TWL XL LVL3 (GOWN DISPOSABLE) ×1
KIT RM TURNOVER STRD PROC AR (KITS) ×2 IMPLANT
LABEL OR SOLS (LABEL) ×2 IMPLANT
NEEDLE HYPO 25X1 1.5 SAFETY (NEEDLE) ×2 IMPLANT
NS IRRIG 500ML POUR BTL (IV SOLUTION) ×2 IMPLANT
PACK BASIN MINOR ARMC (MISCELLANEOUS) ×2 IMPLANT
PAD GROUND ADULT SPLIT (MISCELLANEOUS) ×2 IMPLANT
SCRUB CHG 4% DYNA-HEX 4OZ (MISCELLANEOUS) ×1 IMPLANT
SUT MNCRL AB 4-0 PS2 18 (SUTURE) IMPLANT
SYRINGE 10CC LL (SYRINGE) ×2 IMPLANT

## 2015-07-24 NOTE — Anesthesia Preprocedure Evaluation (Signed)
Anesthesia Evaluation  Patient identified by MRN, date of birth, ID band Patient awake    Reviewed: Allergy & Precautions, H&P , NPO status , Patient's Chart, lab work & pertinent test results, reviewed documented beta blocker date and time   History of Anesthesia Complications (+) history of anesthetic complications (difficult to put to sleep)  Airway Mallampati: III  TM Distance: >3 FB Neck ROM: full    Dental no notable dental hx. (+) Partial Upper, Poor Dentition   Pulmonary neg shortness of breath, neg sleep apnea, neg COPD, neg recent URI, former smoker,    Pulmonary exam normal breath sounds clear to auscultation       Cardiovascular Exercise Tolerance: Good hypertension, On Medications (-) angina(-) CAD, (-) Past MI, (-) Cardiac Stents and (-) CABG Normal cardiovascular exam(-) dysrhythmias (-) Valvular Problems/Murmurs Rhythm:regular Rate:Normal     Neuro/Psych negative neurological ROS  negative psych ROS   GI/Hepatic Neg liver ROS, GERD  Medicated and Controlled,  Endo/Other  diabetes, Poorly Controlled, Oral Hypoglycemic Agents  Renal/GU negative Renal ROS  negative genitourinary   Musculoskeletal   Abdominal   Peds  Hematology negative hematology ROS (+)   Anesthesia Other Findings Past Medical History:   Complication of anesthesia                                     Comment:kept moving while under anesthesia during               colonoscopy, hard to put under.   Diabetes mellitus without complication (HCC)                 Hypertension                                                 Elevated lipids                                              GERD (gastroesophageal reflux disease)                       Bulging lumbar disc                                            Comment:lower back   Hyperlipidemia                                               Arthritis                                                     Reproductive/Obstetrics negative OB ROS  Anesthesia Physical Anesthesia Plan  ASA: II  Anesthesia Plan: General   Post-op Pain Management:    Induction:   Airway Management Planned:   Additional Equipment:   Intra-op Plan:   Post-operative Plan:   Informed Consent: I have reviewed the patients History and Physical, chart, labs and discussed the procedure including the risks, benefits and alternatives for the proposed anesthesia with the patient or authorized representative who has indicated his/her understanding and acceptance.   Dental Advisory Given  Plan Discussed with: Anesthesiologist, CRNA and Surgeon  Anesthesia Plan Comments:         Anesthesia Quick Evaluation

## 2015-07-24 NOTE — Anesthesia Procedure Notes (Signed)
Procedure Name: Intubation Date/Time: 07/24/2015 2:28 PM Performed by: Shirlee LimerickMARION, Cathalina Barcia Pre-anesthesia Checklist: Patient identified, Emergency Drugs available, Suction available and Patient being monitored Patient Re-evaluated:Patient Re-evaluated prior to inductionOxygen Delivery Method: Circle system utilized Preoxygenation: Pre-oxygenation with 100% oxygen Intubation Type: IV induction Laryngoscope Size: Mac and 3 Grade View: Grade I Tube type: Oral Tube size: 7.0 mm Number of attempts: 1 Secured at: 22 cm Tube secured with: Tape Dental Injury: Teeth and Oropharynx as per pre-operative assessment

## 2015-07-24 NOTE — Transfer of Care (Signed)
Immediate Anesthesia Transfer of Care Note  Patient: Bruce ActonHarold D Maiers  Procedure(s) Performed: Procedure(s): CIRCUMCISION ADULT (N/A)  Patient Location: PACU  Anesthesia Type:General  Level of Consciousness: sedated  Airway & Oxygen Therapy: Patient Spontanous Breathing and Patient connected to nasal cannula oxygen  Post-op Assessment: Report given to RN and Post -op Vital signs reviewed and stable  Post vital signs: Reviewed and stable  Last Vitals:  Filed Vitals:   07/24/15 1302 07/24/15 1520  BP: 158/82 156/96  Pulse: 89 96  Temp: 36.9 C 37.3 C  Resp: 18 12    Complications: No apparent anesthesia complications

## 2015-07-24 NOTE — Interval H&P Note (Signed)
History and Physical Interval Note:  07/24/2015 2:05 PM  Bruce Richardson  has presented today for surgery, with the diagnosis of phimosis  The various methods of treatment have been discussed with the patient and family. After consideration of risks, benefits and other options for treatment, the patient has consented to  Procedure(s): CIRCUMCISION ADULT (N/A) as a surgical intervention .  The patient's history has been reviewed, patient examined, no change in status, stable for surgery.  I have reviewed the patient's chart and labs.  Questions were answered to the patient's satisfaction.    RRR Unlabored respirations  [name]

## 2015-07-24 NOTE — H&P (View-Only) (Signed)
07/11/2015 3:21 PM   Bruce Richardson 12-09-1950 [id number]  Referring provider: Roselee Nova, MD [address] [place], [place] [address]  Chief Complaint  Patient presents with  . Paraphimosis    New Patient    HPI:  1 - Phimosis - pt with progressive bother from tightening of foreskin / phimotic ring. Can still retract to void, but just barely. 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 He is very bothered.   Today "Bruce Richardson" is seen for opinion on phimosis.    PMH: Past Medical History  Diagnosis Date  . Complication of anesthesia     kept moving while under anesthesia during colonoscopy, hard to put under.  . Diabetes mellitus without complication ([place])   . Hypertension   . Elevated lipids   . GERD (gastroesophageal reflux disease)   . Bulging lumbar disc     lower back  . Hyperlipidemia     Surgical History: Past Surgical History  Procedure Laterality Date  . Shoulder arthroscopy Bilateral   . Shoulder arthroscopy Right 12/22/2014    Procedure: RIGHT SHOULDER ARTHROSCOPY /DECOMPRESSION/ROTATOR CUFF REPAIR OF RECURRENT ROTATOR CUFF TEAR;  Surgeon: Corky Mull, MD;  Location: [facility] ORS;  Service: Orthopedics;  Laterality: Right;    Home Medications:    Medication List       This list is accurate as of: 07/11/15  3:21 PM.  Always use your most recent med list.               ACCU-CHEK AVIVA PLUS test strip  Generic drug:  glucose blood  2 (two) times daily. for testing     ACCU-CHEK AVIVA PLUS W/DEVICE Kit  use as directed twice a day BEFORE LUNCH AND SUPPER     ACCU-CHEK SOFTCLIX LANCETS lancets  2 (two) times daily. for testing     cyclobenzaprine 10 MG tablet  Commonly known as:  FLEXERIL  Take 1 tablet (10 mg total) by mouth 3 (three) times daily.     Liraglutide 18 MG/3ML Sopn  0.6 mg [place] qd x 1 wk, then 1.2 mg [place] qd.     lisinopril 40 MG tablet  Commonly known as:  PRINIVIL,ZESTRIL  Take 1 tablet (40 mg total) by mouth daily.     metFORMIN 1000 MG tablet  Commonly known as:  GLUCOPHAGE  Take 1 tablet (1,000 mg total) by mouth 2 (two) times daily with a meal.     oxyCODONE 5 MG immediate release tablet  Commonly known as:  ROXICODONE  Take 1-2 tablets (5-10 mg total) by mouth every 4 (four) hours as needed for severe pain.     pantoprazole 40 MG tablet  Commonly known as:  PROTONIX  Take 1 tablet (40 mg total) by mouth daily.     Pen Needles 32G X 4 MM Misc  1 Device by Does not apply route every 7 (seven) days.     simvastatin 20 MG tablet  Commonly known as:  ZOCOR  Take 1 tablet (20 mg total) by mouth daily. Taken at supper time        Allergies:  Allergies  Allergen Reactions  . Oxycodone Itching    Family History: Family History  Problem Relation Age of Onset  . Alzheimer's disease Mother   . Cancer Father   . Heart failure Brother   . Prostate cancer Other     unknown  . Bladder Cancer Other     unknown    Social History:  reports that  he quit smoking about 13 years ago. His smoking use included Cigarettes. He started smoking about 13 years ago. He smoked 1.50 packs per day. He has quit using smokeless tobacco. He reports that he does not drink alcohol or use illicit drugs.  ROS: UROLOGY Frequent Urination?: Yes Hard to postpone urination?: No Burning/pain with urination?: No Get up at night to urinate?: Yes Leakage of urine?: No Urine stream starts and stops?: No Trouble starting stream?: No Do you have to strain to urinate?: No Blood in urine?: No Urinary tract infection?: No Sexually transmitted disease?: No Injury to kidneys or bladder?: No Painful intercourse?: No Weak stream?: No Erection problems?: No Penile pain?: No  Gastrointestinal Nausea?: No Vomiting?: No Indigestion/heartburn?: No Diarrhea?: No Constipation?: No  Constitutional Fever: No Night sweats?: Yes Weight loss?: No Fatigue?: No  Skin Skin rash/lesions?: No Itching?: No  Eyes Blurred  vision?: No Double vision?: No  Ears/Nose/Throat Sore throat?: No Sinus problems?: Yes  Hematologic/Lymphatic Swollen glands?: No Easy bruising?: No  Cardiovascular Leg swelling?: No Chest pain?: No  Respiratory Cough?: No Shortness of breath?: No  Endocrine Excessive thirst?: No  Musculoskeletal Back pain?: Yes Joint pain?: Yes  Neurological Headaches?: No Dizziness?: Yes  Psychologic Depression?: No Anxiety?: No  Physical Exam: BP 156/82 mmHg  Pulse 106  Ht _0  (1.778 m)  Wt 236 lb 8 oz (107.276 kg)  BMI 33.93 kg/m2  Constitutional:  Alert and oriented, No acute distress. HEENT: [place] AT, moist mucus membranes.  Trachea midline, no masses. Cardiovascular: No clubbing, cyanosis, or edema. Respiratory: Normal respiratory effort, no increased work of breathing. GI: Abdomen is soft, nontender, nondistended, no abdominal masses GU: No CVA tenderness.UNcircumcised with tender, red phimotic ring that is just barely reducible. No worisome glans / foreskin lesions. No active purlulent infection.  Skin: No rashes, bruises or suspicious lesions. Lymph: No cervical or inguinal adenopathy. Neurologic: Grossly intact, no focal deficits, moving all 4 extremities. Psychiatric: Normal mood and affect.  Laboratory Data: Lab Results  Component Value Date   WBC 7.1 12/20/2014   HGB 15.2 12/20/2014   HCT 46.5 12/20/2014   MCV 86.7 12/20/2014   PLT 224 12/20/2014    Lab Results  Component Value Date   CREATININE 1.02 07/05/2015    No results found for: PSA  No results found for: TESTOSTERONE  Lab Results  Component Value Date   HGBA1C 11.6 07/04/2015    Urinalysis    Component Value Date/Time   COLORURINE Yellow 12/29/2012 2143   APPEARANCEUR Clear 12/29/2012 2143   LABSPEC 1.024 12/29/2012 2143   PHURINE 5.0 12/29/2012 2143   GLUCOSEU Negative 12/29/2012 2143   HGBUR 1+ 12/29/2012 2143   BILIRUBINUR Negative 12/29/2012 2143   KETONESUR Negative  12/29/2012 2143   PROTEINUR 30 mg/dL 12/29/2012 2143   NITRITE Negative 12/29/2012 2143   LEUKOCYTESUR Negative 12/29/2012 2143    Assessment & Plan:    1 - Phimosis - discussed natural history and that his diabetes increases risk of progression. Management options including topical steroid / antifungal v. Dorsal slit v. circ discussed. Pt wants to proceed with circumcision next avail. Rec in OR given sig phimotic ring. Periop course, risks, (taking too much skin, change in sexual sensation, others) and benefits discussed. Will arrange. Encouraged good glycemic control to minimize infectious problems in peri-op setting.  2 - RTC 5-7 days post-circ for wound check, then prn if stable.      No Follow-up on file.  Alexis Frock, [facility] [address], [place], [place] [address] [phone]

## 2015-07-24 NOTE — Op Note (Signed)
Date of procedure: 07/24/2015  Preoperative diagnosis:  1. Phimosis   Postoperative diagnosis:  1. Phimosis   Procedure: 1. Circumcision  Surgeon: Baruch Gouty, MD  Anesthesia: General  Complications: None  Intraoperative findings: Phimosis  EBL: None  Specimens: None  Drains: None  Disposition: Stable to the postanesthesia care unit  Indication for procedure: The patient is a 64 y.o. male with symptomatically phimosis who desires circumcision.  After reviewing the management options for treatment, the patient elected to proceed with the above surgical procedure(s). We have discussed the potential benefits and risks of the procedure, side effects of the proposed treatment, the likelihood of the patient achieving the goals of the procedure, and any potential problems that might occur during the procedure or recuperation. Informed consent has been obtained.  Description of procedure: The patient was met in the preoperative area. All risks, benefits, and indications of the procedure were described in great detail. The patient consented to the procedure. Preoperative antibiotics were given. The patient was taken to the operative theater. General anesthesia was induced per the anesthesia service.The patient was placed in the supine position and prepped and draped in the usual sterile fashion. A timeout was called. At the beginning and the end of the procedure, a penile block was performed at the base of the penis and the dorsum side. A total of 15 cc of 0.5% lidocaine was used. After measuring for appropriate length, 2 circumferential incisions were made with a scalpel to excise the foreskin. One incision was the mid shaft of penis and the other 1 cm below the coronal margin. He is electric cautery the foreskin was then removed from the penis in a circumferential pattern. Hemostasis was then obtained and was excellent. The edges of the circumcision incision reapproximated 12:00 and 6:00  with interrupted 3-0 chromic. Interrupted 3-0 chromic was then placed at the 3:00 and 9:00 position in interrupted fashion. The remainder of the incision was reapproximated interrupted fashion circumferentially around the penis with a 3-0 chromic. Once the penile skin was fully reapproximated, a bacitracin soaked gauze was placed over the incision. Penis is unwrapped and circumferentially with gauze. Patient was then woken from anesthesia and transferred stable condition to postanesthesia care unit.  Plan: The patient follow-up in 1 month.  Baruch Gouty, M.D.

## 2015-07-25 ENCOUNTER — Encounter: Payer: Self-pay | Admitting: Urology

## 2015-07-25 ENCOUNTER — Telehealth: Payer: Self-pay | Admitting: *Deleted

## 2015-07-25 NOTE — Telephone Encounter (Signed)
Spoke with patient and he states he already has his appointment scheduled.

## 2015-07-25 NOTE — Telephone Encounter (Signed)
-----   Message from Hildred LaserBrian James Budzyn, MD sent at 07/24/2015  3:22 PM EST ----- Mr. Bruce DragonCarr needs to f/u in one month.  Thanks, BB

## 2015-07-26 NOTE — Anesthesia Postprocedure Evaluation (Addendum)
Anesthesia Post Note  Patient: Audelia ActonHarold D Osborn  Procedure(s) Performed: Procedure(s) (LRB): CIRCUMCISION ADULT (N/A)  Patient location during evaluation: PACU Anesthesia Type: General Level of consciousness: awake and alert Pain management: pain level controlled Vital Signs Assessment: post-procedure vital signs reviewed and stable Respiratory status: spontaneous breathing, nonlabored ventilation, respiratory function stable and patient connected to nasal cannula oxygen Cardiovascular status: blood pressure returned to baseline and stable Postop Assessment: no signs of nausea or vomiting Anesthetic complications: no    Last Vitals:  Filed Vitals:   07/24/15 1555 07/24/15 1604  BP: 146/89 149/81  Pulse: 90 83  Temp: 37.1 C 36.3 C  Resp: 19 18    Last Pain:  Filed Vitals:   07/25/15 1455  PainSc: 0-No pain                 Lenard SimmerAndrew Lacora Folmer

## 2015-07-26 NOTE — Addendum Note (Signed)
Addendum  created 07/26/15 1535 by Lenard SimmerAndrew Trevaris Pennella, MD   Modules edited: Clinical Notes   Clinical Notes:  File: [id number]

## 2015-07-31 ENCOUNTER — Encounter: Payer: Self-pay | Admitting: Urology

## 2015-07-31 ENCOUNTER — Ambulatory Visit (INDEPENDENT_AMBULATORY_CARE_PROVIDER_SITE_OTHER): Payer: Medicare Other | Admitting: Urology

## 2015-07-31 VITALS — BP 169/84 | HR 96 | Ht 70.0 in | Wt 238.9 lb

## 2015-07-31 DIAGNOSIS — R599 Enlarged lymph nodes, unspecified: Secondary | ICD-10-CM

## 2015-07-31 DIAGNOSIS — R590 Localized enlarged lymph nodes: Secondary | ICD-10-CM

## 2015-07-31 DIAGNOSIS — T819XXA Unspecified complication of procedure, initial encounter: Secondary | ICD-10-CM

## 2015-07-31 MED ORDER — CEPHALEXIN 500 MG PO CAPS: 500.0000 mg | ORAL_CAPSULE | Freq: Three times a day (TID) | ORAL | Status: DC

## 2015-07-31 NOTE — Progress Notes (Signed)
07/31/2015 1:21 PM   Bruce Richardson 1950/08/25 [id number]  Referring provider: Roselee Nova, MD [address] [place], [place] [address]  Chief Complaint  Patient presents with  . Post-op Problem    HPI: 64 year old male who presents today for evaluation of circumcision incision. He underwent circumcision in the operating room by Dr. Pilar Jarvis exactly 1 week ago. He reports that he's been having issues since the procedure and is very worried that he has an infection.  No fevers or chills.  He does report the left the dressing on for exactly 24 hours after the procedure. After taking off the dressing, he's noticed increasing swelling of the penile shaft skin. There is some very mild clears drainage from the incision but no pus. He is concerned about some red dots on his shaft skin as well as crusting of the glans. He also notes that he has some enlarged inguinal lymph nodes which are tender. He reports warmth throughout the male genitalia and buttock area and is concerned that this was an infection.   He has been using triple antibiotic cream regularly to keep his shaft from sticking to his pants. He does not see an improvement over the past week.  No issues voiding.   PMH: Past Medical History  Diagnosis Date  . Complication of anesthesia     kept moving while under anesthesia during colonoscopy, hard to put under.  . Diabetes mellitus without complication ([place])   . Hypertension   . Elevated lipids   . GERD (gastroesophageal reflux disease)   . Bulging lumbar disc     lower back  . Hyperlipidemia   . Arthritis     Surgical History: Past Surgical History  Procedure Laterality Date  . Shoulder arthroscopy Bilateral   . Shoulder arthroscopy Right 12/22/2014    Procedure: RIGHT SHOULDER ARTHROSCOPY /DECOMPRESSION/ROTATOR CUFF REPAIR OF RECURRENT ROTATOR CUFF TEAR;  Surgeon: Corky Mull, MD;  Location: [facility] ORS;  Service: Orthopedics;  Laterality: Right;  .  Circumcision N/A 07/24/2015    Procedure: CIRCUMCISION ADULT;  Surgeon: Nickie Retort, MD;  Location: [facility] ORS;  Service: Urology;  Laterality: N/A;    Home Medications:    Medication List       This list is accurate as of: 07/31/15  1:21 PM.  Always use your most recent med list.               ACCU-CHEK AVIVA PLUS test strip  Generic drug:  glucose blood  2 (two) times daily. for testing     ACCU-CHEK AVIVA PLUS W/DEVICE Kit  use as directed twice a day BEFORE LUNCH AND SUPPER     ACCU-CHEK SOFTCLIX LANCETS lancets  2 (two) times daily. for testing     cephALEXin 500 MG capsule  Commonly known as:  KEFLEX  Take 1 capsule (500 mg total) by mouth 3 (three) times daily.     Liraglutide 18 MG/3ML Sopn  0.6 mg [place] qd x 1 wk, then 1.2 mg [place] qd.     lisinopril 40 MG tablet  Commonly known as:  PRINIVIL,ZESTRIL  Take 1 tablet (40 mg total) by mouth daily.     metFORMIN 1000 MG tablet  Commonly known as:  GLUCOPHAGE  Take 1 tablet (1,000 mg total) by mouth 2 (two) times daily with a meal.     oxyCODONE 5 MG immediate release tablet  Commonly known as:  ROXICODONE  Take 1-2 tablets (5-10 mg total) by mouth every  4 (four) hours as needed for severe pain.     pantoprazole 40 MG tablet  Commonly known as:  PROTONIX  Take 1 tablet (40 mg total) by mouth daily.     Pen Needles 32G X 4 MM Misc  1 Device by Does not apply route every 7 (seven) days.     simvastatin 20 MG tablet  Commonly known as:  ZOCOR  Take 1 tablet (20 mg total) by mouth daily. Taken at supper time        Allergies: No Known Allergies  Family History: Family History  Problem Relation Age of Onset  . Alzheimer's disease Mother   . Cancer Father   . Heart failure Brother   . Prostate cancer Other     unknown  . Bladder Cancer Other     unknown    Social History:  reports that he quit smoking about 13 years ago. His smoking use included Cigarettes. He started smoking about 13 years ago. He  smoked 1.50 packs per day. He has quit using smokeless tobacco. He reports that he does not drink alcohol or use illicit drugs.  ROS: UROLOGY Frequent Urination?: No Hard to postpone urination?: No Burning/pain with urination?: No Get up at night to urinate?: No Leakage of urine?: No Urine stream starts and stops?: No Trouble starting stream?: No Do you have to strain to urinate?: No Blood in urine?: No Urinary tract infection?: No Sexually transmitted disease?: No Injury to kidneys or bladder?: No Painful intercourse?: No Weak stream?: No Erection problems?: No Penile pain?: No  Gastrointestinal Nausea?: No Vomiting?: No Indigestion/heartburn?: No Diarrhea?: No Constipation?: No  Constitutional Fever: No Night sweats?: No Weight loss?: No Fatigue?: No  Skin Skin rash/lesions?: Yes Itching?: Yes  Eyes Blurred vision?: Yes Double vision?: No  Ears/Nose/Throat Sore throat?: No Sinus problems?: No  Hematologic/Lymphatic Swollen glands?: Yes Easy bruising?: Yes  Cardiovascular Leg swelling?: No Chest pain?: No  Respiratory Cough?: No Shortness of breath?: No  Endocrine Excessive thirst?: No  Musculoskeletal Back pain?: No Joint pain?: No  Neurological Headaches?: No Dizziness?: No  Psychologic Depression?: No Anxiety?: No  Physical Exam: BP 169/84 mmHg  Pulse 96  Ht $R'5\' 10"'ou$  (1.778 m)  Wt 238 lb 14.4 oz (108.364 kg)  BMI 34.28 kg/m2  Constitutional:  Alert and oriented, No acute distress. HEENT: [place] AT, moist mucus membranes.  Trachea midline, no masses. Cardiovascular: No clubbing, cyanosis, or edema. Respiratory: Normal respiratory effort, no increased work of breathing. GI: Abdomen is soft, nontender, nondistended, no abdominal masses GU: No CVA tenderness. Newly circumcised phallus, edematous penile shaft skin. There is a fine slightly erythematous papular rash on the skin. Suture line intact without drainage or erythema. Glans was some  mild crusting.  Meatus patent. Skin: No rashes, bruises or suspicious lesions. Lymph: Slightly tender and enlarged bilateral inguinal lymph nodes. Neurologic: Grossly intact, no focal deficits, moving all 4 extremities. Psychiatric: Normal mood and affect.  Laboratory Data: Lab Results  Component Value Date   WBC 7.1 12/20/2014   HGB 15.2 12/20/2014   HCT 46.5 12/20/2014   MCV 86.7 12/20/2014   PLT 224 12/20/2014    Lab Results  Component Value Date   CREATININE 0.87 07/18/2015     Lab Results  Component Value Date   HGBA1C 11.6 07/04/2015     Assessment & Plan:    1. Circumcision complication, initial encounter No obvious frank infection today. There is a good amount of penile edema which I explained could be  within the realm of normal. There is also a fine rash on the shaft which I suspect is related to contact dermatitis from his triple antibiotic cream. Given the presence of tender inguinal lymph nodes, however, I will go ahead and treat him for presumed infection with Keflex tid x 7 days.  I have advised him to stop the triple antibiotic cream and use bacitracin or vaseline for lubrication.  Supportive care discussed.  2. Inguinal lymphadenopathy As above   Patient was encouraged to call or return sooner if his symptoms do not improve. Plan for follow-up 1 month following the procedure as previously scheduled.  Hollice Espy, MD  [facility] [address] [place], [place] [address] [phone]

## 2015-08-04 ENCOUNTER — Telehealth: Payer: Self-pay

## 2015-08-04 ENCOUNTER — Encounter: Payer: Self-pay | Admitting: Family Medicine

## 2015-08-04 ENCOUNTER — Ambulatory Visit (INDEPENDENT_AMBULATORY_CARE_PROVIDER_SITE_OTHER): Payer: Medicare Other | Admitting: Family Medicine

## 2015-08-04 VITALS — BP 158/80 | HR 99 | Temp 98.4°F | Resp 15 | Ht 70.0 in | Wt 242.7 lb

## 2015-08-04 DIAGNOSIS — E1165 Type 2 diabetes mellitus with hyperglycemia: Secondary | ICD-10-CM | POA: Diagnosis not present

## 2015-08-04 DIAGNOSIS — J309 Allergic rhinitis, unspecified: Secondary | ICD-10-CM | POA: Insufficient documentation

## 2015-08-04 DIAGNOSIS — I10 Essential (primary) hypertension: Secondary | ICD-10-CM

## 2015-08-04 DIAGNOSIS — J302 Other seasonal allergic rhinitis: Secondary | ICD-10-CM | POA: Diagnosis not present

## 2015-08-04 DIAGNOSIS — IMO0001 Reserved for inherently not codable concepts without codable children: Secondary | ICD-10-CM

## 2015-08-04 MED ORDER — MOMETASONE FUROATE 50 MCG/ACT NA SUSP
2.0000 | Freq: Every day | NASAL | Status: DC
Start: 2015-08-04 — End: 2016-04-11

## 2015-08-04 NOTE — Progress Notes (Signed)
Name: Bruce Richardson   MRN: [id number]    DOB: 09-26-1950   Date:08/04/2015       Progress Note  Subjective  Chief Complaint  Chief Complaint  Patient presents with  . Follow-up    1 MO  . Medication Refill    Nasonex 75mg  . Diabetes  . Hyperlipidemia  . Hypertension    Diabetes He presents for his follow-up diabetic visit. He has type 2 diabetes mellitus. His disease course has been improving. Hypoglycemia symptoms include headaches. Associated symptoms include polyuria. Pertinent negatives for diabetes include no chest pain, no fatigue and no polydipsia. Current diabetic treatment includes oral agent (monotherapy) (Victoza). His weight is stable. He is following a diabetic diet. His home blood glucose trend is decreasing steadily. His breakfast blood glucose range is generally 130-140 mg/dl. An ACE inhibitor/angiotensin II receptor blocker is being taken.   Allergic Rhinitis Pt. Requesting refill for Nasonex. Taking Nasonex for allergies and sinus inflammation.   Past Medical History  Diagnosis Date  . Complication of anesthesia     kept moving while under anesthesia during colonoscopy, hard to put under.  . Diabetes mellitus without complication ([place]   . Hypertension   . Elevated lipids   . GERD (gastroesophageal reflux disease)   . Bulging lumbar disc     lower back  . Hyperlipidemia   . Arthritis     Past Surgical History  Procedure Laterality Date  . Shoulder arthroscopy Bilateral   . Shoulder arthroscopy Right 12/22/2014    Procedure: RIGHT SHOULDER ARTHROSCOPY /DECOMPRESSION/ROTATOR CUFF REPAIR OF RECURRENT ROTATOR CUFF TEAR;  Surgeon: JCorky Mull MD;  Location: [facility] OR[facility];  Service: Orthopedics;  Laterality: Right;  . Circumcision N/A 07/24/2015    Procedure: CIRCUMCISION ADULT;  Surgeon: BNickie Retort MD;  Location: [facility] OR[facility];  Service: Urology;  Laterality: N/A;    Family History  Problem Relation Age of Onset  . Alzheimer's disease Mother   . Cancer  Father   . Heart failure Brother   . Prostate cancer Other     unknown  . Bladder Cancer Other     unknown    Social History   Social History  . Marital Status: Married    Spouse Name: N/A  . Number of Children: N/A  . Years of Education: N/A   Occupational History  . Not on file.   Social History Main Topics  . Smoking status: Former Smoker -- 1.50 packs/day    Types: Cigarettes    Start date: 12/19/2001    Quit date: 07/03/2002  . Smokeless tobacco: Former [employer] developer . Alcohol Use: No  . Drug Use: No  . Sexual Activity: Not on file   Other Topics Concern  . Not on file   Social History Narrative     Current outpatient prescriptions:  .  ACCU-CHEK AVIVA PLUS test strip, 2 (two) times daily. for testing, Disp: , Rfl: 1 .  ACCU-CHEK SOFTCLIX LANCETS lancets, 2 (two) times daily. for testing, Disp: , Rfl: 1 .  Blood Glucose Monitoring Suppl (ACCU-CHEK AVIVA PLUS) W/DEVICE KIT, use as directed twice a day BEFORE LUNCH AND SUPPER, Disp: , Rfl: 0 .  cephALEXin (KEFLEX) 500 MG capsule, Take 1 capsule (500 mg total) by mouth 3 (three) times daily., Disp: 15 capsule, Rfl: 0 .  Insulin Pen Needle (PEN NEEDLES) 32G X 4 MM MISC, 1 Device by Does not apply route every 7 (seven) days., Disp: 30 each, Rfl: 1 .  Liraglutide 18 MG/3ML  SOPN, 0.6 mg [place] qd x 1 wk, then 1.2 mg [place] qd., Disp: 6 mL, Rfl: 2 .  lisinopril (PRINIVIL,ZESTRIL) 40 MG tablet, Take 1 tablet (40 mg total) by mouth daily., Disp: 30 tablet, Rfl: 12 .  metFORMIN (GLUCOPHAGE) 1000 MG tablet, Take 1 tablet (1,000 mg total) by mouth 2 (two) times daily with a meal., Disp: 60 tablet, Rfl: 12 .  mometasone (NASONEX) 50 MCG/ACT nasal spray, Place 2 sprays into the nose daily., Disp: , Rfl:  .  oxyCODONE (ROXICODONE) 5 MG immediate release tablet, Take 1-2 tablets (5-10 mg total) by mouth every 4 (four) hours as needed for severe pain., Disp: 30 tablet, Rfl: 0 .  pantoprazole (PROTONIX) 40 MG tablet, Take 1 tablet (40 mg total) by  mouth daily., Disp: 30 tablet, Rfl: 12 .  simvastatin (ZOCOR) 20 MG tablet, Take 1 tablet (20 mg total) by mouth daily. Taken at supper time, Disp: 30 tablet, Rfl: 12  No Known Allergies   Review of Systems  Constitutional: Negative for fever, chills, malaise/fatigue and fatigue.  HENT: Positive for congestion.   Eyes: Eye discharge: itchy eyes.  Respiratory: Negative for cough.   Cardiovascular: Negative for chest pain.  Neurological: Positive for headaches.  Endo/Heme/Allergies: Negative for polydipsia.    Objective  Filed Vitals:   08/04/15 0909  BP: 158/80  Pulse: 99  Temp: 98.4 F (36.9 C)  TempSrc: Oral  Resp: 15  Height: _0  (1.778 m)  Weight: 242 lb 11.2 oz (110.088 kg)  SpO2: 99%    Physical Exam  Constitutional: He is oriented to person, place, and time and well-developed, well-nourished, and in no distress.  HENT:  Head: Normocephalic and atraumatic.  Cardiovascular: Normal rate, regular rhythm and normal heart sounds.   No murmur heard. Pulmonary/Chest: [place] normal and breath sounds normal.  Neurological: He is alert and oriented to person, place, and time.  Nursing note and vitals reviewed.    Assessment & Plan  1. Essential hypertension BP elevated because patient has not taken his medication today. Advised to continue with current therapy. Recheck in 3 months.  2. Uncontrolled type 2 diabetes mellitus without complication, without long-term current use of insulin (HCC) Blood glucose readings are improving steadily. Advised to continue on Victoza and Metformin.  3. Other seasonal allergic rhinitis  - mometasone (NASONEX) 50 MCG/ACT nasal spray; Place 2 sprays into the nose daily.  Dispense: 17 g; Refill: 2     Joye Wesenberg [facility] 08/04/2015 9:52 AM

## 2015-08-04 NOTE — Telephone Encounter (Signed)
Pt came in asking for more pain medication. Pt had a circumcision done on 07/24/15 by Dr. Sherryl BartersBudzyn and pt was seen on 07/31/15 by Dr. Apolinar JunesBrandon for possible infection. Per Dr. Apolinar JunesBrandon no more pain medication was to be given. Pt should take tylenol and motrin alternating every 4 hours. Nurse made pt aware of this. Pt voiced frustration stating those have no been helping. Nurse reinforced with pt that Dr. Apolinar JunesBrandon did not see any infection but gave keflex just in case and no need for further strong pain pills. Pt was unhappy but voiced understanding.

## 2015-08-24 ENCOUNTER — Ambulatory Visit: Payer: Medicare Other | Admitting: Urology

## 2015-08-24 ENCOUNTER — Encounter: Payer: Self-pay | Admitting: Urology

## 2015-08-24 VITALS — BP 158/84 | HR 88 | Ht 70.5 in | Wt 243.5 lb

## 2015-08-24 DIAGNOSIS — N471 Phimosis: Secondary | ICD-10-CM

## 2015-08-24 NOTE — Progress Notes (Signed)
08/24/2015 9:42 AM   Bruce Richardson 17-Apr-1951 [id number]  Referring provider: Roselee Nova, MD [address] [place], [place] [address]  Chief Complaint  Patient presents with  . Routine Post Op    circumcision, knot on the head of his penis    HPI: The patient is here for follow-up of his circumcision which was 1 month ago today. He was seen 1 week postoperatively for concern of a possible recurrent infection he was treated with antibiotics. This seems to improve. The patient reports that he is not happy with this wide he still experienced with surgery. He denies any fevers chills nausea or vomiting. He reports his lymph nodes have the results since his last appointment 3 weeks ago. His no issues voiding.   PMH: Past Medical History  Diagnosis Date  . Complication of anesthesia     kept moving while under anesthesia during colonoscopy, hard to put under.  . Diabetes mellitus without complication ([place])   . Hypertension   . Elevated lipids   . GERD (gastroesophageal reflux disease)   . Bulging lumbar disc     lower back  . Hyperlipidemia   . Arthritis     Surgical History: Past Surgical History  Procedure Laterality Date  . Shoulder arthroscopy Bilateral   . Shoulder arthroscopy Right 12/22/2014    Procedure: RIGHT SHOULDER ARTHROSCOPY /DECOMPRESSION/ROTATOR CUFF REPAIR OF RECURRENT ROTATOR CUFF TEAR;  Surgeon: Corky Mull, MD;  Location: [facility];  Service: Orthopedics;  Laterality: Right;  . Circumcision N/A 07/24/2015    Procedure: CIRCUMCISION ADULT;  Surgeon: Nickie Retort, MD;  Location: [facility];  Service: Urology;  Laterality: N/A;    Home Medications:    Medication List       This list is accurate as of: 08/24/15  9:42 AM.  Always use your most recent med list.               ACCU-CHEK AVIVA PLUS test strip  Generic drug:  glucose blood  2 (two) times daily. for testing     ACCU-CHEK AVIVA PLUS w/Device Kit  use as directed twice  a day BEFORE LUNCH AND SUPPER     ACCU-CHEK SOFTCLIX LANCETS lancets  2 (two) times daily. for testing     cephALEXin 500 MG capsule  Commonly known as:  KEFLEX  Take 1 capsule (500 mg total) by mouth 3 (three) times daily.     Liraglutide 18 MG/3ML Sopn  0.6 mg [place] qd x 1 wk, then 1.2 mg [place] qd.     lisinopril 40 MG tablet  Commonly known as:  PRINIVIL,ZESTRIL  Take 1 tablet (40 mg total) by mouth daily.     metFORMIN 1000 MG tablet  Commonly known as:  GLUCOPHAGE  Take 1 tablet (1,000 mg total) by mouth 2 (two) times daily with a meal.     mometasone 50 MCG/ACT nasal spray  Commonly known as:  NASONEX  Place 2 sprays into the nose daily.     mometasone 50 MCG/ACT nasal spray  Commonly known as:  NASONEX  Place 2 sprays into the nose daily.     oxyCODONE 5 MG immediate release tablet  Commonly known as:  ROXICODONE  Take 1-2 tablets (5-10 mg total) by mouth every 4 (four) hours as needed for severe pain.     pantoprazole 40 MG tablet  Commonly known as:  PROTONIX  Take 1 tablet (40 mg total) by mouth daily.  Pen Needles 32G X 4 MM Misc  1 Device by Does not apply route every 7 (seven) days.     simvastatin 20 MG tablet  Commonly known as:  ZOCOR  Take 1 tablet (20 mg total) by mouth daily. Taken at supper time        Allergies: No Known Allergies  Family History: Family History  Problem Relation Age of Onset  . Alzheimer's disease Mother   . Cancer Father   . Heart failure Brother   . Prostate cancer Other     unknown  . Bladder Cancer Other     unknown    Social History:  reports that he quit smoking about 13 years ago. His smoking use included Cigarettes. He started smoking about 13 years ago. He smoked 1.50 packs per day. He has quit using smokeless tobacco. He reports that he does not drink alcohol or use illicit drugs.  ROS: UROLOGY Frequent Urination?: No Hard to postpone urination?: No Burning/pain with urination?: No Get up at night to  urinate?: No Leakage of urine?: No Urine stream starts and stops?: No Trouble starting stream?: No Do you have to strain to urinate?: No Blood in urine?: No Urinary tract infection?: No Sexually transmitted disease?: No Injury to kidneys or bladder?: No Painful intercourse?: No Weak stream?: No Erection problems?: No Penile pain?: No  Gastrointestinal Nausea?: No Vomiting?: No Indigestion/heartburn?: No Diarrhea?: No Constipation?: No  Constitutional Fever: No Night sweats?: No Weight loss?: No Fatigue?: No  Skin Skin rash/lesions?: No Itching?: No  Eyes Blurred vision?: No Double vision?: No  Ears/Nose/Throat Sore throat?: No Sinus problems?: No  Hematologic/Lymphatic Swollen glands?: No Easy bruising?: No  Cardiovascular Leg swelling?: No Chest pain?: No  Respiratory Cough?: No Shortness of breath?: No  Endocrine Excessive thirst?: No  Musculoskeletal Back pain?: No Joint pain?: No  Neurological Headaches?: No Dizziness?: No  Psychologic Depression?: No Anxiety?: No  Physical Exam: BP 158/84 mmHg  Pulse 88  Ht 5' 10.5" (1.791 m)  Wt 243 lb 8 oz (110.451 kg)  BMI 34.43 kg/m2  Constitutional:  Alert and oriented, No acute distress. HEENT: [place] AT, moist mucus membranes.  Trachea midline, no masses. Cardiovascular: No clubbing, cyanosis, or edema. Respiratory: Normal respiratory effort, no increased work of breathing. GI: Abdomen is soft, nontender, nondistended, no abdominal masses GU: No CVA tenderness. No inguinal lymphadenopathy noted. Circumcision is healing without sign of infection or wound dehiscence. He does still have some edema in his distal shaft. Skin: No rashes, bruises or suspicious lesions. Lymph: No cervical or inguinal adenopathy. Neurologic: Grossly intact, no focal deficits, moving all 4 extremities. Psychiatric: Normal mood and affect.  Laboratory Data: Lab Results  Component Value Date   WBC 7.1 12/20/2014   HGB  15.2 12/20/2014   HCT 46.5 12/20/2014   MCV 86.7 12/20/2014   PLT 224 12/20/2014    Lab Results  Component Value Date   CREATININE 0.87 07/18/2015    No results found for: PSA  No results found for: TESTOSTERONE  Lab Results  Component Value Date   HGBA1C 11.6 07/04/2015    Urinalysis    Component Value Date/Time   COLORURINE Yellow 12/29/2012 2143   APPEARANCEUR Clear 12/29/2012 2143   LABSPEC 1.024 12/29/2012 2143   PHURINE 5.0 12/29/2012 2143   GLUCOSEU Negative 12/29/2012 2143   HGBUR 1+ 12/29/2012 2143   BILIRUBINUR Negative 12/29/2012 2143   KETONESUR Negative 12/29/2012 2143   PROTEINUR 30 mg/dL 12/29/2012 2143   NITRITE Negative 12/29/2012  2143   LEUKOCYTESUR Negative 12/29/2012 2143     Assessment & Plan:    1. Circumcision follow-up Incision is healing without sign of dehiscence. He still has edema in the distal shat. The patient is unhappy with the appearance and in particular the edema. He was counseled that this was normal for postoperative circumcision healing. He was advised that it should improve with time. His will follow up in 1 month to check on his progress.   Nickie Retort, MD  [facility] [address], [place], [place] [address] [phone]

## 2015-09-12 DIAGNOSIS — E119 Type 2 diabetes mellitus without complications: Secondary | ICD-10-CM | POA: Diagnosis not present

## 2015-10-05 ENCOUNTER — Ambulatory Visit: Payer: Medicare Other

## 2015-10-06 ENCOUNTER — Ambulatory Visit: Payer: Medicare Other | Admitting: Urology

## 2015-10-10 ENCOUNTER — Encounter: Payer: Self-pay | Admitting: Urology

## 2015-10-10 ENCOUNTER — Ambulatory Visit (INDEPENDENT_AMBULATORY_CARE_PROVIDER_SITE_OTHER): Payer: Medicare Other | Admitting: Urology

## 2015-10-10 VITALS — BP 189/83 | HR 94 | Ht 71.0 in | Wt 243.9 lb

## 2015-10-10 DIAGNOSIS — N481 Balanitis: Secondary | ICD-10-CM

## 2015-10-10 MED ORDER — CLOTRIMAZOLE-BETAMETHASONE 1-0.05 % EX CREA: TOPICAL_CREAM | CUTANEOUS | Status: AC

## 2015-10-10 NOTE — Progress Notes (Signed)
10/10/2015 11:28 AM   [name] 15-Oct-1950 [id number]  Referring provider: Roselee Nova, MD [address] [place], [place] [address]  No chief complaint on file.   HPI: Bruce Richardson is a 65yo seen in followup for circumcision. He complains of itching from the incision.  He denies any pain. He is upset that he has residual penile skin.   PMH: Past Medical History  Diagnosis Date  . Complication of anesthesia     kept moving while under anesthesia during colonoscopy, hard to put under.  . Diabetes mellitus without complication ([place])   . Hypertension   . Elevated lipids   . GERD (gastroesophageal reflux disease)   . Bulging lumbar disc     lower back  . Hyperlipidemia   . Arthritis     Surgical History: Past Surgical History  Procedure Laterality Date  . Shoulder arthroscopy Bilateral   . Shoulder arthroscopy Right 12/22/2014    Procedure: RIGHT SHOULDER ARTHROSCOPY /DECOMPRESSION/ROTATOR CUFF REPAIR OF RECURRENT ROTATOR CUFF TEAR;  Surgeon: Corky Mull, MD;  Location: [facility];  Service: Orthopedics;  Laterality: Right;  . Circumcision N/A 07/24/2015    Procedure: CIRCUMCISION ADULT;  Surgeon: Nickie Retort, MD;  Location: [facility];  Service: Urology;  Laterality: N/A;    Home Medications:    Medication List       This list is accurate as of: 10/10/15 11:28 AM.  Always use your most recent med list.               ACCU-CHEK AVIVA PLUS test strip  Generic drug:  glucose blood  2 (two) times daily. for testing     ACCU-CHEK AVIVA PLUS w/Device Kit  use as directed twice a day BEFORE LUNCH AND SUPPER     ACCU-CHEK SOFTCLIX LANCETS lancets  2 (two) times daily. for testing     Liraglutide 18 MG/3ML Sopn  0.6 mg [place] qd x 1 wk, then 1.2 mg [place] qd.     lisinopril 40 MG tablet  Commonly known as:  PRINIVIL,ZESTRIL  Take 1 tablet (40 mg total) by mouth daily.     metFORMIN 1000 MG tablet  Commonly known as:  GLUCOPHAGE  Take 1 tablet (1,000  mg total) by mouth 2 (two) times daily with a meal.     mometasone 50 MCG/ACT nasal spray  Commonly known as:  NASONEX  Place 2 sprays into the nose daily.     pantoprazole 40 MG tablet  Commonly known as:  PROTONIX  Take 1 tablet (40 mg total) by mouth daily.     Pen Needles 32G X 4 MM Misc  1 Device by Does not apply route every 7 (seven) days.     simvastatin 20 MG tablet  Commonly known as:  ZOCOR  Take 1 tablet (20 mg total) by mouth daily. Taken at supper time        Allergies: No Known Allergies  Family History: Family History  Problem Relation Age of Onset  . Alzheimer's disease Mother   . Cancer Father   . Heart failure Brother   . Prostate cancer Other     unknown  . Bladder Cancer Other     unknown    Social History:  reports that he quit smoking about 13 years ago. His smoking use included Cigarettes. He started smoking about 13 years ago. He smoked 1.50 packs per day. He has quit using smokeless tobacco. He reports that he does not drink  alcohol or use illicit drugs.  ROS: UROLOGY Frequent Urination?: No Hard to postpone urination?: No Burning/pain with urination?: No Get up at night to urinate?: No Leakage of urine?: No Urine stream starts and stops?: No Trouble starting stream?: No Do you have to strain to urinate?: Yes Blood in urine?: No Urinary tract infection?: No Sexually transmitted disease?: No Injury to kidneys or bladder?: No Painful intercourse?: No Weak stream?: No Erection problems?: No Penile pain?: Yes  Gastrointestinal Nausea?: No Vomiting?: No Indigestion/heartburn?: No Diarrhea?: No Constipation?: No  Constitutional Fever: No Night sweats?: No Weight loss?: No Fatigue?: No  Skin Skin rash/lesions?: No Itching?: No  Eyes Blurred vision?: No Double vision?: No  Ears/Nose/Throat Sore throat?: Yes Sinus problems?: Yes  Hematologic/Lymphatic Swollen glands?: No Easy bruising?: No  Cardiovascular Leg  swelling?: No Chest pain?: No  Respiratory Cough?: Yes Shortness of breath?: No  Endocrine Excessive thirst?: No  Musculoskeletal Back pain?: Yes Joint pain?: No  Neurological Headaches?: No Dizziness?: No  Psychologic Depression?: No Anxiety?: No  Physical Exam: BP 189/83 mmHg  Pulse 94  Ht '5\' 11"'$  (1.803 m)  Wt 110.632 kg (243 lb 14.4 oz)  BMI 34.03 kg/m2  Constitutional:  Alert and oriented, No acute distress. HEENT: [place] AT, moist mucus membranes.  Trachea midline, no masses. Cardiovascular: No clubbing, cyanosis, or edema. Respiratory: Normal respiratory effort, no increased work of breathing. GI: Abdomen is soft, nontender, nondistended, no abdominal masses GU: No CVA tenderness. Redundant penile skin covering 1/3 of the glans, well demarcated erythema of redundant skin and glans. Skin: No rashes, bruises or suspicious lesions. Lymph: No cervical or inguinal adenopathy. Neurologic: Grossly intact, no focal deficits, moving all 4 extremities. Psychiatric: Normal mood and affect.  Laboratory Data: Lab Results  Component Value Date   WBC 7.1 12/20/2014   HGB 15.2 12/20/2014   HCT 46.5 12/20/2014   MCV 86.7 12/20/2014   PLT 224 12/20/2014    Lab Results  Component Value Date   CREATININE 0.87 07/18/2015    No results found for: PSA  No results found for: TESTOSTERONE  Lab Results  Component Value Date   HGBA1C 11.6 07/04/2015    Urinalysis    Component Value Date/Time   COLORURINE Yellow 12/29/2012 2143   APPEARANCEUR Clear 12/29/2012 2143   LABSPEC 1.024 12/29/2012 2143   PHURINE 5.0 12/29/2012 2143   GLUCOSEU Negative 12/29/2012 2143   HGBUR 1+ 12/29/2012 2143   BILIRUBINUR Negative 12/29/2012 2143   KETONESUR Negative 12/29/2012 2143   PROTEINUR 30 mg/dL 12/29/2012 2143   NITRITE Negative 12/29/2012 2143   LEUKOCYTESUR Negative 12/29/2012 2143    Pertinent Imaging: none  Assessment & Plan:   1. Balanitis -clotrimazole BID for 21  days -RTC 1 month  There are no diagnoses linked to this encounter.  No Follow-up on file.  Cleon Gustin, [facility] [address], [place], [place] [address] [phone]

## 2015-10-13 DIAGNOSIS — N481 Balanitis: Secondary | ICD-10-CM | POA: Insufficient documentation

## 2015-11-02 ENCOUNTER — Ambulatory Visit: Payer: Medicare Other | Admitting: Family Medicine

## 2015-11-13 ENCOUNTER — Other Ambulatory Visit: Payer: Self-pay | Admitting: Family Medicine

## 2015-11-17 ENCOUNTER — Ambulatory Visit: Payer: Medicare Other

## 2016-02-02 ENCOUNTER — Ambulatory Visit: Payer: Medicare Other | Admitting: Family Medicine

## 2016-04-11 ENCOUNTER — Ambulatory Visit (INDEPENDENT_AMBULATORY_CARE_PROVIDER_SITE_OTHER): Payer: Medicare HMO | Admitting: Family Medicine

## 2016-04-11 ENCOUNTER — Encounter: Payer: Self-pay | Admitting: Family Medicine

## 2016-04-11 VITALS — BP 138/86 | HR 93 | Temp 99.0°F | Resp 16 | Ht 70.0 in | Wt 233.0 lb

## 2016-04-11 DIAGNOSIS — K219 Gastro-esophageal reflux disease without esophagitis: Secondary | ICD-10-CM | POA: Diagnosis not present

## 2016-04-11 DIAGNOSIS — I10 Essential (primary) hypertension: Secondary | ICD-10-CM | POA: Diagnosis not present

## 2016-04-11 DIAGNOSIS — E669 Obesity, unspecified: Secondary | ICD-10-CM | POA: Diagnosis not present

## 2016-04-11 DIAGNOSIS — J302 Other seasonal allergic rhinitis: Secondary | ICD-10-CM

## 2016-04-11 DIAGNOSIS — E1165 Type 2 diabetes mellitus with hyperglycemia: Secondary | ICD-10-CM

## 2016-04-11 DIAGNOSIS — E785 Hyperlipidemia, unspecified: Secondary | ICD-10-CM | POA: Diagnosis not present

## 2016-04-11 DIAGNOSIS — IMO0001 Reserved for inherently not codable concepts without codable children: Secondary | ICD-10-CM

## 2016-04-11 LAB — POCT GLYCOSYLATED HEMOGLOBIN (HGB A1C): Hemoglobin A1C: 14

## 2016-04-11 MED ORDER — ATORVASTATIN CALCIUM 40 MG PO TABS: 40.0000 mg | ORAL_TABLET | Freq: Every day | ORAL | 3 refills | Status: DC

## 2016-04-11 MED ORDER — PANTOPRAZOLE SODIUM 40 MG PO TBEC: 40.0000 mg | DELAYED_RELEASE_TABLET | Freq: Every day | ORAL | 3 refills | Status: DC

## 2016-04-11 MED ORDER — METFORMIN HCL 1000 MG PO TABS: 1000.0000 mg | ORAL_TABLET | Freq: Two times a day (BID) | ORAL | 3 refills | Status: DC

## 2016-04-11 MED ORDER — MOMETASONE FUROATE 50 MCG/ACT NA SUSP: 2.0000 | Freq: Every day | NASAL | 3 refills | Status: DC

## 2016-04-11 MED ORDER — LISINOPRIL 40 MG PO TABS: 40.0000 mg | ORAL_TABLET | Freq: Every day | ORAL | 3 refills | Status: DC

## 2016-04-11 MED ORDER — LIRAGLUTIDE 18 MG/3ML ~~LOC~~ SOPN: PEN_INJECTOR | SUBCUTANEOUS | 2 refills | Status: DC

## 2016-04-11 NOTE — Assessment & Plan Note (Addendum)
Known abnormal lipids 06/2015, low HDL, high LDL, high TG, obesity BMI >78>30, limited exercise / poor diet, also uncontrolled DM2 - ASCVD 10 yr risk calculator from prior labs >36%, discussed this elevated risk with patient  Plan: 1. Discontinue Simvastatin 20mg  daily 2. Start Atorvastatin 40mg  daily (high intensity) 3. Start Aspirin 81mg  daily 4. Check fasting lipid panel 06/2016 5. Start exercising, improve diet

## 2016-04-11 NOTE — Patient Instructions (Signed)
Thank you for coming in to clinic today.  1. Your A1c has significantly increased from 11 to >14%. This is a very significant increase, and means that your Diabetes is currently out of control. We need to get this under control, and as we lower your blood sugar you may feel more sick, but this is how your body adjusts to this. - As discussed, I have recommended starting Insulin therapy with Lantus insulin, we can provide coupons and discounts for the cost burden - We agreed to restart the Victoza for now to give your blood sugar a chance to respond, if we are not making significant progress over next 1 to 3 months, then the option will be to add Insulin - Use Victoza 0.6mg  daily for 1 week, then increase to double the dose of 1.2mg  daily until you see me next - Check blood sugar 1-2 times a day every day and write down on log, bring to next visit - Continue Metformin 1000mg  twice a day - Changed cholesterol medication, stop Simvastatin, start Atorvastatin 40mg  daily - Start baby Aspirin 81mg  daily for heart attack and stroke prevention - Recommend start exercising regularly and improving diet, can refer to Nutrition if needed - Blood pressure elevated, keep checking at home   Eat at least 3 meals and 1-2 snacks per day (don't skip breakfast).  Aim for no more than 5 hours between eating. - Tip: If you go >5 hours without eating and become very hungry, your body will supply it's own resources temporarily and you can gain extra weight when you eat.  The 5 Minute Rule of Exercise - Promise yourself to at least do 5 minutes of exercise (make sure you time it), and if at the end of 5 minutes (this is the hardest part of the work-out), if you still feel like you want stop (or not motivated to continue) then allow yourself to stop. Otherwise, more often than not you will feel encouraged that you can continue for a little while longer or even more!  Diet Recommendations for Diabetes   Starchy (carb)  foods include: Bread, rice, pasta, potatoes, corn, crackers, bagels, muffins, all baked goods.   Protein foods include: Meat, fish, poultry, eggs, dairy foods, and beans such as pinto and kidney beans (beans also provide carbohydrate).   1. Eat at least 3 meals and 1-2 snacks per day. Never go more than 4-5 hours while awake without eating.   2. Limit starchy foods to TWO per meal and ONE per snack. ONE portion of a starchy  food is equal to the following:   - ONE slice of bread (or its equivalent, such as half of a hamburger bun).   - 1/2 cup of a "scoopable" starchy food such as potatoes or rice.   - 1 OUNCE (28 grams) of starchy snacks (crackers or pretzels, look on label).   - 15 grams of carbohydrate as shown on food label.   3. Both lunch and dinner should include a protein food, a carb food, and vegetables.   - Obtain twice as many veg's as protein or carbohydrate foods for both lunch and dinner.   - Try to keep frozen veg's on hand for a quick vegetable serving.     - Fresh or frozen veg's are best.   4. Breakfast should always include protein.     Please schedule a follow-up appointment with Dr. Althea CharonKaramalegos in 4 weeks for Diabetes, Blood Pressure, bring blood sugar logs.  Return for Flu  Shot in September or October, call office first to schedule  Next visit 3 months for A1c Diabetes follow-up, and physical with blood work.  If you have any other questions or concerns, please feel free to call the clinic or send a message through MyChart. You may also schedule an earlier appointment if necessary.  Bruce [name], DO [facility]

## 2016-04-11 NOTE — Assessment & Plan Note (Signed)
Moderate to poorly controlled HTN, initial elevated SBP today, improved on re-check No known complications.   Plan:  1. Refilled Lisinopril 40mg  daily, discussed recommendations that we will likely start additional anti-HTN therapy with thiazide or CCB 2. Last chemistry 06/2015, next due 06/2016, follow Cr 3. Lifestyle modification, start exercising, cont reduced salt 4. Monitor BP at home or at drug store occasionally, bring readings

## 2016-04-11 NOTE — Assessment & Plan Note (Signed)
Refilled Protonix 40mg  daily Consider de-escalating back to 20mg  daily if remains controlled

## 2016-04-11 NOTE — Assessment & Plan Note (Signed)
Uncontrolled, significant worsening A1c now up to >14% (04/11/16), previously >11% (06/2015) Non-adherent to Victoza (off for several months, did not titrate up) Has seen multiple providers and not followed prior recommendations regarding diabetes No known complications or hypoglycemia.  Plan:  1. Detailed discussion regarding significant worsening DM control, and strong consideration that patient may require starting insulin therapy, however mutual decision made to resume prior therapy given he has been out for months, as his A1c had dramatically responded to this in the past. 2. Re-order Liraglutide - start 0.6mg  [place] inj x 1 week, then increase up to 1.2mg  daily 3. Refilled Metformin 1000mg  BID  4. Check CBG 1-2x daily for now, CBG logs given, bring to next visit 5. Counseled on starting DM diet and exercise regularly 6. Start ASA 81, inc to high intensity statin 7. Will need future referral to ophtho if not established, PNA vaccine 8. Close follow-up 4-6 weeks monitor CBG logs, consider inc liraglutide from 1.2 to 1.8 max dose daily, repeat A1c in 3 months 06/2016 with labs, if still significantly elevated options will re-discuss insulin

## 2016-04-11 NOTE — Progress Notes (Signed)
Subjective:    Patient ID: Bruce Richardson, male    DOB: 03-31-1951, 65 y.o.   MRN: [id number]  Bruce Richardson is a 65 y.o. male presenting on 04/11/2016 for Re-establish Care, DM, HTN   HPI  CHRONIC DM, Type 2: Reports no concerns currently, otherwise feels well. CBGs: Avg 200-400s, reports prior episode of CBG 500 at hospital. Checks CBGs infrequently, few times a week. 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  CHRONIC HTN: Reports elevated BP today. Does check BP at home with electronic cuff 150s-180s / 70s-90s Current Meds - Lisinopril 40mg  daily Reports good compliance, took meds today. Tolerating well, w/o complaints. Infrequently misses BP med. Denies chest pain, dyspnea, HA, edema, dizziness / lightheadedness  HYPERLIPIDEMIA: - Reports no concerns. Last lipid panel 06/2015, abnormal, fasting today for lipid panel check - Currently taking Simvastatin 20mg , tolerating well without side effects or myalgias  GERD - Chronic problem - Taking Protonix 40mg  daily, tolerating currently - Denies abdominal pain, nausea, vomiting  OBESITY - BMI >30, prior history weight up to 240s, recently down, but some fluctuation - See lifestyle above   Social History  Substance Use Topics  . Smoking status: Former Smoker    Packs/day: 1.50    Types: Cigarettes    Start date: 12/19/2001    Quit date: 07/03/2002  . Smokeless tobacco: Former Neurosurgeon  . Alcohol use No   Family History  Problem  Relation Age of Onset  . Alzheimer's disease Mother   . Diabetes Mother   . Hypertension Mother   . Cancer Father   . Heart failure Brother   . Diabetes Brother   . Hypertension Brother   . Heart disease Brother   . Prostate cancer Other     unknown  . Bladder Cancer Other     unknown  . Heart disease Son   . Diabetes Sister      Review of Systems Per HPI unless specifically indicated above     Objective:    BP 138/86 (BP Location: Left Arm)   Pulse 93   Temp 99 F (37.2 C) (Oral)   Resp 16   Ht 5\' 10"  (1.778 m)   Wt 233 lb (105.7 kg)   BMI 33.43 kg/m   Wt Readings from Last 3 Encounters:  04/11/16 233 lb (105.7 kg)  10/10/15 243 lb 14.4 oz (110.6 kg)  08/24/15 243 lb 8 oz (110.5 kg)    Physical Exam  Constitutional: He is oriented to person, place, and time. He appears well-developed and well-nourished. No distress.  HENT:  Head: Normocephalic and atraumatic.  Mouth/Throat: Oropharynx is clear and moist.  Eyes: Conjunctivae and EOM are normal. Pupils are equal, round, and reactive to light.  Neck: Normal range of motion. Neck supple. No thyromegaly present.  Cardiovascular: Normal rate, regular rhythm, normal heart sounds and intact distal pulses.   No murmur heard. Pulmonary/Chest: Effort normal and breath sounds normal. No respiratory distress. He has no wheezes. He has no rales.  Abdominal: Soft. Bowel sounds are normal. He exhibits no  distension and no mass. There is no tenderness.  Musculoskeletal: He exhibits no edema or tenderness.  Surgical scar bilateral shoulders anteriorly  Lymphadenopathy:    He has no cervical adenopathy.  Neurological: He is alert and oriented to person, place, and time.  Skin: Skin is warm and dry. No rash noted. He is not diaphoretic.  Nursing note and vitals reviewed.  DM FOOT EXAM - normal appearance, no lesions, calluses, or ulcers, intact sensation to monofilament bilaterally      Assessment & Plan:   Problem List  Items Addressed This Visit    Uncontrolled type 2 diabetes mellitus without complication (HCC) - Primary    Uncontrolled, significant worsening A1c now up to >14% (04/11/16), previously >11% (06/2015) Non-adherent to Victoza (off for several months, did not titrate up) Has seen multiple providers and not followed prior recommendations regarding diabetes No known complications or hypoglycemia.  Plan:  1. Detailed discussion regarding significant worsening DM control, and strong consideration that patient may require starting insulin therapy, however mutual decision made to resume prior therapy given he has been out for months, as his A1c had dramatically responded to this in the past. 2. Re-order Liraglutide - start 0.6mg  [place] inj x 1 week, then increase up to 1.2mg  daily 3. Refilled Metformin 1000mg  BID  4. Check CBG 1-2x daily for now, CBG logs given, bring to next visit 5. Counseled on starting DM diet and exercise regularly 6. Start ASA 81, inc to high intensity statin 7. Will need future referral to ophtho if not established, PNA vaccine 8. Close follow-up 4-6 weeks monitor CBG logs, consider inc liraglutide from 1.2 to 1.8 max dose daily, repeat A1c in 3 months 06/2016 with labs, if still significantly elevated options will re-discuss insulin       Relevant Medications   lisinopril (PRINIVIL,ZESTRIL) 40 MG tablet   metFORMIN (GLUCOPHAGE) 1000 MG tablet   Liraglutide 18 MG/3ML SOPN   atorvastatin (LIPITOR) 40 MG tablet   Other Relevant Orders   POCT HgB A1C (Completed)   Obesity   Relevant Medications   metFORMIN (GLUCOPHAGE) 1000 MG tablet   Liraglutide 18 MG/3ML SOPN   Hypertension    Moderate to poorly controlled HTN, initial elevated SBP today, improved on re-check No known complications.   Plan:  1. Refilled Lisinopril 40mg  daily, discussed recommendations that we will likely start additional anti-HTN therapy with thiazide or CCB 2. Last chemistry 06/2015, next due 06/2016,  follow Cr 3. Lifestyle modification, start exercising, cont reduced salt 4. Monitor BP at home or at drug store occasionally, bring readings       Relevant Medications   lisinopril (PRINIVIL,ZESTRIL) 40 MG tablet   atorvastatin (LIPITOR) 40 MG tablet   GERD (gastroesophageal reflux disease)    Refilled Protonix 40mg  daily Consider de-escalating back to 20mg  daily if remains controlled      Relevant Medications   pantoprazole (PROTONIX) 40 MG tablet   Dyslipidemia    Known abnormal lipids 06/2015, low HDL, high LDL, high TG, obesity BMI >16>30, limited exercise / poor diet, also uncontrolled DM2 - ASCVD 10 yr risk calculator from prior labs >36%, discussed this elevated risk with patient  Plan: 1. Discontinue Simvastatin 20mg  daily 2. Start Atorvastatin 40mg  daily (high intensity) 3. Start Aspirin 81mg  daily 4. Check fasting lipid panel 06/2016 5. Start exercising, improve diet      Relevant Medications   atorvastatin (LIPITOR) 40 MG tablet   Allergic rhinitis    Refilled Nasonex  Relevant Medications   mometasone (NASONEX) 50 MCG/ACT nasal spray    Other Visit Diagnoses   None.     Meds ordered this encounter  Medications  . pantoprazole (PROTONIX) 40 MG tablet    Sig: Take 1 tablet (40 mg total) by mouth daily.    Dispense:  90 tablet    Refill:  3  . mometasone (NASONEX) 50 MCG/ACT nasal spray    Sig: Place 2 sprays into the nose daily.    Dispense:  51 g    Refill:  3  . lisinopril (PRINIVIL,ZESTRIL) 40 MG tablet    Sig: Take 1 tablet (40 mg total) by mouth daily.    Dispense:  90 tablet    Refill:  3  . metFORMIN (GLUCOPHAGE) 1000 MG tablet    Sig: Take 1 tablet (1,000 mg total) by mouth 2 (two) times daily with a meal.    Dispense:  180 tablet    Refill:  3  . Liraglutide 18 MG/3ML SOPN    Sig: 0.6 mg [place] qd x 1 wk, then 1.2 mg [place] qd.    Dispense:  6 mL    Refill:  2  . atorvastatin (LIPITOR) 40 MG tablet    Sig: Take 1 tablet (40 mg total) by  mouth daily.    Dispense:  90 tablet    Refill:  3      Follow up plan: Return in about 4 weeks (around 05/09/2016) for diabetes, blood pressure.  Saralyn PilarAlexander Any Mcneice, DO [facility] 04/11/2016, 4:24 PM

## 2016-04-11 NOTE — Assessment & Plan Note (Signed)
Refilled Nasonex

## 2016-04-12 ENCOUNTER — Telehealth: Payer: Self-pay

## 2016-04-12 NOTE — Telephone Encounter (Signed)
Patient seen by me on 04/11/16 to re-establish care. Reviewed chart, [facility] should be [facility], will need to re-check with pharmacy.  Regarding 2nd BP pill, this was discussed with patient, but ultimately I had advised him that we would not make this change now, however he will likely need to start one in the future if BP does not improve.  Please advise patient to continue to check BP at home daily or every other day for 2 weeks (different times of the day) with his cuff, record his readings, if BP is consistently < 140 / 90, he can follow-up as planned in 4-6 weeks and we will re-check BP. If BP is consistently >150 / 90, then he should call us back with these readings in 2 weeks and I will send in the 2nd BP pill, Hydrochlorothiazide 12.5mg  daily.  Saralyn PilarAlexander Doxie Augenstein, DO [facility] [employer] [facility] [facility] 04/12/2016, 9:10 AM

## 2016-04-12 NOTE — Telephone Encounter (Signed)
Patient came by reporting that his nasonex and 2nd BP pill was not at the pharmacy yesterday.  Please advise.

## 2016-04-12 NOTE — Telephone Encounter (Signed)
Patient aware of recommendation on blood pressure monitoring for the next 2 weeks. He was informed to call office with readings. Patient says [facility] had rx for Nasonex but it was not ready for pick-up.

## 2016-04-29 ENCOUNTER — Other Ambulatory Visit: Payer: Self-pay | Admitting: Family Medicine

## 2016-04-30 ENCOUNTER — Other Ambulatory Visit: Payer: Self-pay | Admitting: Family Medicine

## 2016-04-30 DIAGNOSIS — R69 Illness, unspecified: Secondary | ICD-10-CM | POA: Diagnosis not present

## 2016-04-30 MED ORDER — ONETOUCH DELICA LANCETS 33G MISC: 1.0000 | Freq: Two times a day (BID) | 11 refills | Status: DC

## 2016-05-01 DIAGNOSIS — R69 Illness, unspecified: Secondary | ICD-10-CM | POA: Diagnosis not present

## 2016-05-03 ENCOUNTER — Other Ambulatory Visit: Payer: Self-pay | Admitting: Family Medicine

## 2016-05-03 ENCOUNTER — Telehealth: Payer: Self-pay | Admitting: Family Medicine

## 2016-05-03 DIAGNOSIS — IMO0001 Reserved for inherently not codable concepts without codable children: Secondary | ICD-10-CM

## 2016-05-03 DIAGNOSIS — E1165 Type 2 diabetes mellitus with hyperglycemia: Principal | ICD-10-CM

## 2016-05-03 DIAGNOSIS — R69 Illness, unspecified: Secondary | ICD-10-CM | POA: Diagnosis not present

## 2016-05-03 MED ORDER — ONETOUCH ULTRA SYSTEM W/DEVICE KIT: 1.0000 | PACK | Freq: Once | 0 refills | Status: AC

## 2016-05-03 NOTE — Telephone Encounter (Signed)
Patient has one touch ultra test strips that we sent in, However [facility]     Documentation

## 2016-05-03 NOTE — Telephone Encounter (Signed)
OneTouch Ultra device kit glucometer sent to [facility].  Bruce Richardson, [facility] 05/03/2016, 8:57 AM

## 2016-05-20 ENCOUNTER — Ambulatory Visit: Payer: Medicare Other | Admitting: Family Medicine

## 2016-05-24 ENCOUNTER — Encounter: Payer: Self-pay | Admitting: Family Medicine

## 2016-05-24 ENCOUNTER — Ambulatory Visit (INDEPENDENT_AMBULATORY_CARE_PROVIDER_SITE_OTHER): Payer: Medicare HMO | Admitting: Family Medicine

## 2016-05-24 VITALS — BP 138/82 | HR 84 | Temp 98.4°F | Resp 16 | Ht 70.0 in | Wt 231.0 lb

## 2016-05-24 DIAGNOSIS — E1165 Type 2 diabetes mellitus with hyperglycemia: Secondary | ICD-10-CM

## 2016-05-24 DIAGNOSIS — I10 Essential (primary) hypertension: Secondary | ICD-10-CM | POA: Diagnosis not present

## 2016-05-24 DIAGNOSIS — E1169 Type 2 diabetes mellitus with other specified complication: Secondary | ICD-10-CM | POA: Diagnosis not present

## 2016-05-24 DIAGNOSIS — E785 Hyperlipidemia, unspecified: Secondary | ICD-10-CM | POA: Diagnosis not present

## 2016-05-24 DIAGNOSIS — IMO0001 Reserved for inherently not codable concepts without codable children: Secondary | ICD-10-CM

## 2016-05-24 NOTE — Patient Instructions (Addendum)
Thank you for coming in to clinic today.  1. Blood sugar and pressure logs look great! - No med changes today - Continue checking blood sugar - 1) Fasting in morning (no food or drink before) every day... And then every other day, 2 hours after lunch OR dinner OR before bed. - Keep checking BP maybe every other day or several times a week, different times of day, can bring cuff in next time to re-check  Referral to Ophthalmology - Dr Clydene PughWoodard  Please schedule a follow-up appointment with Dr. Althea CharonKaramalegos after 07/12/16 in November or December for Diabetes, HTN, Cholesterol f/u  You will be due for fasting blood work at next visit. - Chemistry, fasting lipid, and A1c Two options: 1. About 1 to 2 weeks before your scheduled Annual Physical Appointment, please schedule a separate "Lab Only" visit here at the clinic to draw your blood only. Prefer early morning, open at 8:00am. You need to be fasting (No food or drink after midnight, and nothing except water / coffee (no cream or sugar) in the morning before your blood draw). - This is the preferred option. We will have your lab results available when you arrive at your Annual Physical and we can discuss results in person. If there are any significant changes or updates, our office will contact you earlier with MyChart or by phone.  2. If you can't get blood draw before your Next Visit, then please arrive FASTING to your appointment (prefer to schedule in morning), and we will draw blood work on that same day. Results will follow 2-3 days later, and we will send your results through MyChart online or by phone.   If you have any other questions or concerns, please feel free to call the clinic or send a message through MyChart. You may also schedule an earlier appointment if necessary.  Saralyn PilarAlexander Karamalegos, DO [facility]

## 2016-05-24 NOTE — Assessment & Plan Note (Signed)
Abnormal last lipids and obesity BMI >30, limited exercise / poor diet, also uncontrolled DM2. Elevated ASCVD risk.  Plan: 1. Continue Atorvastatin 40mg  (high intensity), Cont ASA 81 2. Continue diet, improve regular exercise 3. Order future fasting lipid panel, for next visit 2 months

## 2016-05-24 NOTE — Progress Notes (Signed)
Subjective:    Patient ID: Bruce Richardson, male    DOB: 11/10/1950, 65 y.o.   MRN: [id number]  Bruce Richardson is a 65 y.o. male presenting on 05/24/2016 for follow-up DM, HTN   HPI  CHRONIC DM, Type 2: Reports feels like his sugar is significantly improved. He feels better and tries to eat right now. Stopped eating all fried foods and red meat. He now eats a regular breakfast each day. CBGs: he brought in detailed CBG logs as asked. 1-2 weeks after last visit still had some elevated CBGs 170-220s. However, over next 6 weeks he has major improved CBG numbers, Avg 120-130s, low 91, high 152, checks CBG 4-6 times a day recently. Meds: Metformin 1000mg  BID, Victoza started 0.6mg  up to 1.2mg  daily now Tolerating well w/o side-effects Currently on ACEi Wants to change ophthalmology, no longer wants to go to [facility], wants to see Dr Clydene Pugh for annual DM eye Denies hypoglycemia, numbness or tingling, polyuria  CHRONIC HTN: Mild elevated BP today initial, some improvement on re-check. He has BP log every day for past several weeks, with good results (electronic arm cuff) 120-130s/60-70s. Did not bring cuff. Current Meds - Lisinopril 40mg  daily Reports good compliance, took meds today. Tolerating well, w/o complaints Denies chest pain, dyspnea, HA, edema, dizziness / lightheadedness  HYPERLIPIDEMIA: - Last Lipid panel 06/2015, last visit changed from Simvastatin 20mg  to Atorvastatin 40mg  for high intensity statin given DM and abnormal lipids. Tolerating well without any myalgias.   Social History  Substance Use Topics  . Smoking status: Former Smoker    Packs/day: 1.50    Types: Cigarettes    Start date: 12/19/2001    Quit date: 07/03/2002  . Smokeless tobacco: Former Neurosurgeon  . Alcohol use No      Review of Systems Per HPI unless specifically indicated above     Objective:    BP 138/82 (BP Location: Right Arm, Cuff Size: Normal)   Pulse 84   Temp 98.4 F (36.9 C)  (Oral)   Resp 16   Ht 5\' 10"  (1.778 m)   Wt 231 lb (104.8 kg)   BMI 33.15 kg/m   Wt Readings from Last 3 Encounters:  05/24/16 231 lb (104.8 kg)  04/11/16 233 lb (105.7 kg)  10/10/15 243 lb 14.4 oz (110.6 kg)    Physical Exam  Constitutional: He appears well-developed and well-nourished. No distress.  Well-appearing, comfortable, cooperative  Cardiovascular: Normal rate, regular rhythm, normal heart sounds and intact distal pulses.   No murmur heard. Pulmonary/Chest: Effort normal and breath sounds normal. No respiratory distress. He has no wheezes. He has no rales.  Musculoskeletal: He exhibits no edema.  Neurological: He is alert.  Skin: Skin is warm and dry. He is not diaphoretic.  Nursing note and vitals reviewed.      Assessment & Plan:   Problem List Items Addressed This Visit    Uncontrolled type 2 diabetes mellitus without complication (HCC) - Primary    Previously uncontrolled, now CBG readings significant improvement, back on meds Metformin , Victoza and improved dietary changes. Last A1c >14% (04/11/16). No known complications or hypoglycemia.  Plan:  1. Continue Metformin 1000mg  BID (tolerating), continue Victoza 1.2mg  daily 2. Reduce CBG checks to daily fasting and daily or every other day qod with 2 hr postprandial - logs given 3. Continue dietary / lifestyle changes - encourage regular exercise 4. Continue ASA 81, atorv 40, ACEi 5. Referral to Ophthalmology -Dr Clydene Pugh 6. Declines PNA  vaccine and Flu Shot 7. Follow-up 2 months for A1c, fasting lipid, chemistry      Relevant Orders   COMPLETE METABOLIC PANEL WITH GFR   Lipid panel   Ambulatory referral to Ophthalmology   Hypertension    Mild elevated BP, improved on re-check. Good BP logs from home reviewed today. No known complications.   Plan:  1. Continue Lisinopril 40mg  daily, no additional meds today 2. Follow-up 2 months re-check BP, bring outside cuff in to calibrate, and check chemistry,  lipids      Relevant Orders   COMPLETE METABOLIC PANEL WITH GFR   Hyperlipidemia due to type 2 diabetes mellitus (HCC)    Abnormal last lipids and obesity BMI >30, limited exercise / poor diet, also uncontrolled DM2. Elevated ASCVD risk.  Plan: 1. Continue Atorvastatin 40mg  (high intensity), Cont ASA 81 2. Continue diet, improve regular exercise 3. Order future fasting lipid panel, for next visit 2 months      Relevant Orders   Lipid panel    Other Visit Diagnoses   None.     No orders of the defined types were placed in this encounter.     Follow up plan: Return in about 2 months (around 07/24/2016) for diabetes, blood pressure.  Saralyn PilarAlexander Andreanna Mikolajczak, DO [facility] 05/24/2016, 1:25 PM

## 2016-05-24 NOTE — Assessment & Plan Note (Signed)
Mild elevated BP, improved on re-check. Good BP logs from home reviewed today. No known complications.   Plan:  1. Continue Lisinopril 40mg  daily, no additional meds today 2. Follow-up 2 months re-check BP, bring outside cuff in to calibrate, and check chemistry, lipids

## 2016-05-24 NOTE — Assessment & Plan Note (Signed)
Previously uncontrolled, now CBG readings significant improvement, back on meds Metformin , Victoza and improved dietary changes. Last A1c >14% (04/11/16). No known complications or hypoglycemia.  Plan:  1. Continue Metformin 1000mg  BID (tolerating), continue Victoza 1.2mg  daily 2. Reduce CBG checks to daily fasting and daily or every other day qod with 2 hr postprandial - logs given 3. Continue dietary / lifestyle changes - encourage regular exercise 4. Continue ASA 81, atorv 40, ACEi 5. Referral to Ophthalmology -Dr Clydene PughWoodard 6. Declines PNA vaccine and Flu Shot 7. Follow-up 2 months for A1c, fasting lipid, chemistry

## 2016-06-26 DIAGNOSIS — H1045 Other chronic allergic conjunctivitis: Secondary | ICD-10-CM | POA: Diagnosis not present

## 2016-06-26 DIAGNOSIS — H2513 Age-related nuclear cataract, bilateral: Secondary | ICD-10-CM | POA: Diagnosis not present

## 2016-06-26 DIAGNOSIS — E119 Type 2 diabetes mellitus without complications: Secondary | ICD-10-CM | POA: Diagnosis not present

## 2016-06-26 LAB — HM DIABETES EYE EXAM

## 2016-07-15 DIAGNOSIS — Z0000 Encounter for general adult medical examination without abnormal findings: Secondary | ICD-10-CM | POA: Diagnosis not present

## 2016-07-15 DIAGNOSIS — K219 Gastro-esophageal reflux disease without esophagitis: Secondary | ICD-10-CM | POA: Diagnosis not present

## 2016-07-15 DIAGNOSIS — E785 Hyperlipidemia, unspecified: Secondary | ICD-10-CM | POA: Diagnosis not present

## 2016-07-15 DIAGNOSIS — Z6832 Body mass index (BMI) 32.0-32.9, adult: Secondary | ICD-10-CM | POA: Diagnosis not present

## 2016-07-15 DIAGNOSIS — E118 Type 2 diabetes mellitus with unspecified complications: Secondary | ICD-10-CM | POA: Diagnosis not present

## 2016-07-15 DIAGNOSIS — I10 Essential (primary) hypertension: Secondary | ICD-10-CM | POA: Diagnosis not present

## 2016-07-24 ENCOUNTER — Ambulatory Visit: Payer: Medicare HMO | Admitting: Family Medicine

## 2016-08-26 DIAGNOSIS — H35033 Hypertensive retinopathy, bilateral: Secondary | ICD-10-CM | POA: Diagnosis not present

## 2016-08-26 DIAGNOSIS — E119 Type 2 diabetes mellitus without complications: Secondary | ICD-10-CM | POA: Diagnosis not present

## 2016-08-26 DIAGNOSIS — H2511 Age-related nuclear cataract, right eye: Secondary | ICD-10-CM | POA: Diagnosis not present

## 2016-08-26 DIAGNOSIS — H2513 Age-related nuclear cataract, bilateral: Secondary | ICD-10-CM | POA: Diagnosis not present

## 2016-08-26 DIAGNOSIS — H25013 Cortical age-related cataract, bilateral: Secondary | ICD-10-CM | POA: Diagnosis not present

## 2016-08-26 DIAGNOSIS — H35363 Drusen (degenerative) of macula, bilateral: Secondary | ICD-10-CM | POA: Diagnosis not present

## 2016-10-31 ENCOUNTER — Telehealth: Payer: Self-pay | Admitting: Family Medicine

## 2016-10-31 DIAGNOSIS — E785 Hyperlipidemia, unspecified: Secondary | ICD-10-CM

## 2016-10-31 DIAGNOSIS — K219 Gastro-esophageal reflux disease without esophagitis: Secondary | ICD-10-CM

## 2016-10-31 DIAGNOSIS — IMO0001 Reserved for inherently not codable concepts without codable children: Secondary | ICD-10-CM

## 2016-10-31 DIAGNOSIS — I10 Essential (primary) hypertension: Secondary | ICD-10-CM

## 2016-10-31 DIAGNOSIS — E1165 Type 2 diabetes mellitus with hyperglycemia: Secondary | ICD-10-CM

## 2016-10-31 MED ORDER — PANTOPRAZOLE SODIUM 40 MG PO TBEC: 40.0000 mg | DELAYED_RELEASE_TABLET | Freq: Every day | ORAL | 0 refills | Status: DC

## 2016-10-31 MED ORDER — LISINOPRIL 40 MG PO TABS: 40.0000 mg | ORAL_TABLET | Freq: Every day | ORAL | 0 refills | Status: DC

## 2016-10-31 MED ORDER — ATORVASTATIN CALCIUM 40 MG PO TABS: 40.0000 mg | ORAL_TABLET | Freq: Every day | ORAL | 0 refills | Status: DC

## 2016-10-31 MED ORDER — METFORMIN HCL 1000 MG PO TABS: 1000.0000 mg | ORAL_TABLET | Freq: Two times a day (BID) | ORAL | 0 refills | Status: DC

## 2016-10-31 NOTE — Telephone Encounter (Signed)
Dr. Eula FlaxKs patient. -jh

## 2016-10-31 NOTE — Telephone Encounter (Signed)
Last seen 05/2016, patient did not follow-up as advised in 07/2016. I have agreed to send in a 3 month supply 90 pills with 0 refills, he needs to schedule appointment within next 3 months to continue refills for up to year.  Saralyn PilarAlexander Karamalegos, DO [facility] 10/31/2016, 12:12 PM

## 2016-10-31 NOTE — Telephone Encounter (Signed)
Pt needs prescriptions for metformin, atorvastatin, pantoprazole and [facility]  He gets 3 month supplies.  His call back number is [phone] and he did change pharmacies.

## 2016-11-14 ENCOUNTER — Encounter: Payer: Self-pay | Admitting: Family Medicine

## 2016-11-14 ENCOUNTER — Ambulatory Visit
Admission: RE | Admit: 2016-11-14 | Discharge: 2016-11-14 | Disposition: A | Discharge status: Home / Self Care | Payer: Medicare HMO | Source: Ambulatory Visit | Attending: Family Medicine | Admitting: Family Medicine

## 2016-11-14 ENCOUNTER — Ambulatory Visit (INDEPENDENT_AMBULATORY_CARE_PROVIDER_SITE_OTHER): Payer: Medicare HMO | Admitting: Family Medicine

## 2016-11-14 VITALS — BP 148/77 | HR 113 | Temp 99.2°F | Resp 16 | Ht 70.0 in | Wt 236.0 lb

## 2016-11-14 DIAGNOSIS — M4726 Other spondylosis with radiculopathy, lumbar region: Secondary | ICD-10-CM | POA: Diagnosis not present

## 2016-11-14 DIAGNOSIS — M5441 Lumbago with sciatica, right side: Secondary | ICD-10-CM

## 2016-11-14 DIAGNOSIS — M545 Low back pain, unspecified: Secondary | ICD-10-CM | POA: Insufficient documentation

## 2016-11-14 DIAGNOSIS — M5136 Other intervertebral disc degeneration, lumbar region: Secondary | ICD-10-CM | POA: Diagnosis not present

## 2016-11-14 DIAGNOSIS — M5137 Other intervertebral disc degeneration, lumbosacral region: Secondary | ICD-10-CM | POA: Insufficient documentation

## 2016-11-14 DIAGNOSIS — M47816 Spondylosis without myelopathy or radiculopathy, lumbar region: Secondary | ICD-10-CM | POA: Insufficient documentation

## 2016-11-14 DIAGNOSIS — I700 Atherosclerosis of aorta: Secondary | ICD-10-CM | POA: Insufficient documentation

## 2016-11-14 DIAGNOSIS — G8929 Other chronic pain: Secondary | ICD-10-CM | POA: Insufficient documentation

## 2016-11-14 MED ORDER — BACLOFEN 10 MG PO TABS: 5.0000 mg | ORAL_TABLET | Freq: Three times a day (TID) | ORAL | 1 refills | Status: DC | PRN

## 2016-11-14 MED ORDER — PREDNISONE 10 MG PO TABS: ORAL_TABLET | ORAL | 0 refills | Status: DC

## 2016-11-14 MED ORDER — NAPROXEN 500 MG PO TABS: 500.0000 mg | ORAL_TABLET | Freq: Two times a day (BID) | ORAL | 1 refills | Status: DC

## 2016-11-14 NOTE — Assessment & Plan Note (Addendum)
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 Suspect likely due to muscle spasm/strain initially then worse with new fall injury from 2-4 feet off bed. Known lumbar DJD/OA history of herniated disc. No prior surgery - No red flag symptoms. Negative SLR for radiculopathy, but some back pain, limited ROM - Inadequate conservative therapy / not responding to NSAID  Plan: 1. Discussed back pain management options - patient remained undecided and went back and forth regarding treatment options offered, finally mutual agreement to start Prednisone 6d taper 60 to 10mg  down each day, given sciatica symptoms (hold NSAIDs on prednisone) - cautioned with potential side effects elevated BP, blood sugar, mood changes, edema 2. Once finished prednisone, start anti-inflammatory trial with rx Naprosyn 500mg  BID wc x 2-4 weeks, then PRN 3. Start muscle relaxant with Baclofen 10mg  tabs - take 5-10mg  up to TID PRN, titrate up as tolerated - cautioned sedation, we discussed flexeril for more sedation, but caution in age 66 4. May use Tylenol PRN for breakthrough 5. Encouraged use of heating pad 1-2x daily for now then PRN 6. 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 Follow-up 4-6 weeks if not improved, consider PT trial vs future options for MRI if worsening radicular symptoms

## 2016-11-14 NOTE — Patient Instructions (Addendum)
Thank you for coming in to clinic today.  1. For your Back Pain - I think that this is due to Muscle Spasms or strain. Your Sciatic Nerve can be affected causing some of your radiation and numbness down your legs. 2. Start with Prednisone anti-inflammatory - for 6 days, then finished do not take ibuprofen, advil, naproxen, aleve while taking this prednisone  Recommend trial of Anti-inflammatory with Naproxen (Naprosyn) 500mg  tabs - take one with food and plenty of water TWICE daily every day (breakfast and dinner), for next 2 to 4 weeks, then you may take only as needed - DO NOT TAKE any ibuprofen, aleve, motrin while you are taking this medicine - It is safe to take Tylenol Ext Str 500mg  tabs - take 1 to 2 (max dose 1000mg ) every 6 hours as needed for breakthrough pain, max 24 hour daily dose is 6 to 8 tablets or 4000mg   4. Start Baclofen (Lioresal) 10mg  tablets - cut in half for 5mg  at night for muscle relaxant - may make you sedated or sleepy (be careful driving or working on this) if tolerated you can take every 8 hours, half or whole tab  6. Recommend to start using heating pad on your lower back 1-2x daily for few weeks  This pain may take weeks to months to fully resolve, but hopefully it will respond to the medicine initially. All back injuries (small or serious) are slow to heal since we use our back muscles every day. Be careful with turning, twisting, lifting, sitting / standing for prolonged periods, and avoid re-injury.  If your symptoms significantly worsen with more pain, or new symptoms with weakness in one or both legs, new or different shooting leg pains, numbness in legs or groin, loss of control or retention of urine or bowel movements, please call back for advice and you may need to go directly to the Emergency Department.  Please schedule a follow-up appointment with Dr. Althea CharonKaramalegos in 4-6 weeks if not improved back pain  If you have any other questions or concerns, please  feel free to call the clinic or send a message through MyChart. You may also schedule an earlier appointment if necessary.  Saralyn PilarAlexander Karamalegos, DO [facility]

## 2016-11-14 NOTE — Assessment & Plan Note (Signed)
Suspected chronic underlying etiology for his back pain, now with acute flare following fall. - See A&P back pain

## 2016-11-14 NOTE — Progress Notes (Addendum)
Subjective:    Patient ID: Bruce Richardson, male    DOB: [birth year], 66 y.o.   MRN: [id number]  Bruce Richardson is a 66 y.o. male presenting on 11/14/2016 for Back Pain (onset 3 weeks as per pt fall out of bed last wednesday rediate down to right leg hx of disc diseases )  Patient presents for a same day appointment.  HPI   LOW BACK PAIN, Acute on Chronic, following new fall injury: Reports history of some chronic LBP, with known lumbar DJD and h/o herniated disc, prior back pain >10 years ago previously managed by [facility], he never had back surgery. Only rare intermittent flare ups. - Today presents for concerns of subacute back pain onset 3 weeks ago, then worsened 1 week ago with new fall. Initial symptoms described as low back bilateral pain and stiffness, seems to have been provoked by less activity and laying on couch, he was ill briefly with allergy sinus symptoms, he believes caused his back to be stiff and started to be painful, gradual improvement, he did back stretches and exercises (used to be able to lay on flat ground and use arms to prop himself up and work on arching and flattening his back to help back pain, now he cannot do this after b/l rotator cuff surgery) - Now acute back pain worsened following fall about 1 week ago, describes he was on his bed using 2 pillows and an exercise ball for home back/core exercises, and he rolled off the bed and fell about 2-4 ft on his lower back, had immediate pain bilateral low back aching and sharp pains, some radiating down R leg partway, now has persistent back pain, not improved, and intermittent radiating pain in R leg. Admits some associated abdominal wall muscle soreness, worst pain when stands up due to back weakness and pain. - Taking OTC Ibuprofen '200mg'$  x 4 pills for '800mg'$  up to 2-3 times daily, states no improvement. He was given Tylenol '325mg'$  but not taking it regularly - Denies any fevers/chills, numbness, tingling, weakness,  loss of control bladder/bowel incontinence or retention, unintentional wt loss, night sweats   Social History  Substance Use Topics  . Smoking status: Former Smoker    Packs/day: 1.50    Types: Cigarettes    Start date: 12/19/2001    Quit date: 07/03/2002  . Smokeless tobacco: Former Systems developer  . Alcohol use No    Review of Systems Per HPI unless specifically indicated above     Objective:    BP (!) 148/77   Pulse (!) 113   Temp 99.2 F (37.3 C) (Oral)   Resp 16   Ht '5\' 10"'$  (1.778 m)   Wt 236 lb (107 kg)   BMI 33.86 kg/m   Wt Readings from Last 3 Encounters:  11/14/16 236 lb (107 kg)  05/24/16 231 lb (104.8 kg)  04/11/16 233 lb (105.7 kg)    Physical Exam  Constitutional: He is oriented to person, place, and time. He appears well-developed and well-nourished. No distress.  Well-appearing, comfortable, cooperative  HENT:  Mouth/Throat: Oropharynx is clear and moist.  Cardiovascular: Regular rhythm, normal heart sounds and intact distal pulses.   No murmur heard. Tachycardic  Pulmonary/Chest: Effort normal and breath sounds normal. No respiratory distress. He has no wheezes. He has no rales.  Musculoskeletal: He exhibits no edema.  Low Back Inspection: Normal appearance, larger body habitus, no spinal deformity, symmetrical. Palpation: Localized lower lumbar spinous process pain - Bilateral (L>R) lumbar paraspinal  muscles less tender with mild hypertonicity/spasm. ROM: Limited ROM due to low back pain and stiffness, worst flexion Special Testing: Seated SLR with reproduced localized R back pain but negative for radicular pain Strength: Bilateral hip flex/ext 5/5, knee flex/ext 5/5, ankle dorsiflex/plantarflex 5/5 Neurovascular: intact distal sensation to light touch  Difficulty with standing, using cane, but ambulatory once on feet  Neurological: He is alert and oriented to person, place, and time.  Distal sensation to light touch  Skin: Skin is warm and dry. No rash  noted. He is not diaphoretic. No erythema.  Psychiatric: His behavior is normal.  Nursing note and vitals reviewed.   ------------------------------------------------------------------------------------------------------- I have personally reviewed the radiology report from 06/17/13 Lumbar MRI  REASON FOR EXAM:    Lower Back Pain  COMMENTS:   PROCEDURE:     MMR - MMR LUMBAR SPINE WO CONTRAST  - Jun 17 2013 10:09AM   RESULT:     History: Low back pain.   Comparison Study: No prior.   Findings: Multiplanar, multisequence imaging lumbar spine obtained. No  acute  bony abnormality. Lumbar cord is normal. No paraspinal abnormalities  identified. Prominent simple cyst right kidney present.    L4-L5 mild annular bulge along with facet hypertrophy results in mild  bilateral neural foramen narrowing. No significant spinal stenosis or disc  protrusion. L5-S1 mild annular bulge and facet hypertrophy results in mild  narrowing left neural foramen. No significant disc protrusion or spinal  stenosis.   IMPRESSION:      Mild L4-L5 and L5-S1 disc degeneration with neural  foraminal narrowing as described above. No significant disc protrusion or  spinal stenosis.  --------------------------------------  I have personally reviewed the radiology report from Lumbar spine X-ray on 11/14/16.  CLINICAL DATA:  Low back pain for the past 3 and half weeks. The patient fell out of these the eighth approximately 1 week ago.  EXAM: LUMBAR SPINE - COMPLETE 4+ VIEW  COMPARISON:  None in PACs  FINDINGS: The lumbar vertebral bodies are preserved in height. The disc space heights are reasonably well-maintained with exception of L4-5 where there is very mild narrowing. There is no spondylolisthesis. There is mild facet joint hypertrophy at L5-S1. The pedicles and transverse processes are intact. The observed portions of the sacrum are normal. There is mural calcification within the wall of  the abdominal aorta.  IMPRESSION: There is no acute bony abnormality of the lumbar spine. There is mild degenerative facet joint hypertrophy at L5-S1 and minimal disc space narrowing at L4-5.  Abdominal aortic atherosclerosis.   Electronically Signed   By: David  Martinique M.D.   On: 11/14/2016 13:01     Assessment & Plan:   Problem List Items Addressed This Visit    Degenerative joint disease (DJD) of lumbar spine    Suspected chronic underlying etiology for his back pain, now with acute flare following fall. - See A&P back pain      Relevant Medications   baclofen (LIORESAL) 10 MG tablet   predniSONE (DELTASONE) 10 MG tablet   naproxen (NAPROSYN) 500 MG tablet   Other Relevant Orders   DG Lumbar Spine Complete (Completed)   Acute low back pain with right-sided sciatica - Primary    Acute on chronic b/l LBP with associated R sciatica. Suspect likely due to muscle spasm/strain initially then worse with new fall injury from 2-4 feet off bed. Known lumbar DJD/OA history of herniated disc. No prior surgery - No red flag symptoms. Negative SLR for radiculopathy, but  some back pain, limited ROM - Inadequate conservative therapy / not responding to NSAID  Plan: 1. Discussed back pain management options - patient remained undecided and went back and forth regarding treatment options offered, finally mutual agreement to start Prednisone 6d taper 60 to '10mg'$  down each day, given sciatica symptoms (hold NSAIDs on prednisone) - cautioned with potential side effects elevated BP, blood sugar, mood changes, edema 2. Once finished prednisone, start anti-inflammatory trial with rx Naprosyn '500mg'$  BID wc x 2-4 weeks, then PRN 3. Start muscle relaxant with Baclofen '10mg'$  tabs - take 5-'10mg'$  up to TID PRN, titrate up as tolerated - cautioned sedation, we discussed flexeril for more sedation, but caution in age 30 4. May use Tylenol PRN for breakthrough 5. Encouraged use of heating pad 1-2x daily  for now then PRN 6. 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 Follow-up 4-6 weeks if not improved, consider PT trial vs future options for MRI if worsening radicular symptoms      Relevant Medications   baclofen (LIORESAL) 10 MG tablet   predniSONE (DELTASONE) 10 MG tablet   naproxen (NAPROSYN) 500 MG tablet   Other Relevant Orders   DG Lumbar Spine Complete (Completed)      Meds ordered this encounter  Medications  . baclofen (LIORESAL) 10 MG tablet    Sig: Take 0.5-1 tablets (5-10 mg total) by mouth 3 (three) times daily as needed for muscle spasms.    Dispense:  30 each    Refill:  1  . predniSONE (DELTASONE) 10 MG tablet    Sig: Take 6 tabs with breakfast Day 1, 5 tabs Day 2, 4 tabs Day 3, 3 tabs Day 4, 2 tabs Day 5, 1 tab Day 6.    Dispense:  21 tablet    Refill:  0  . naproxen (NAPROSYN) 500 MG tablet    Sig: Take 1 tablet (500 mg total) by mouth 2 (two) times daily with a meal. For 2-4 weeks then as needed    Dispense:  60 tablet    Refill:  1    Follow up plan: Return in about 6 weeks (around 12/26/2016) for back pain.  Nobie Putnam, [facility] 11/14/2016, 2:55 PM

## 2016-11-26 ENCOUNTER — Other Ambulatory Visit: Payer: Self-pay | Admitting: Family Medicine

## 2016-11-26 DIAGNOSIS — R69 Illness, unspecified: Secondary | ICD-10-CM | POA: Diagnosis not present

## 2016-11-26 MED ORDER — GLUCOSE BLOOD VI STRP: ORAL_STRIP | 12 refills | Status: DC

## 2016-11-29 DIAGNOSIS — K219 Gastro-esophageal reflux disease without esophagitis: Secondary | ICD-10-CM | POA: Diagnosis not present

## 2016-11-29 DIAGNOSIS — Z79899 Other long term (current) drug therapy: Secondary | ICD-10-CM | POA: Diagnosis not present

## 2016-11-29 DIAGNOSIS — E669 Obesity, unspecified: Secondary | ICD-10-CM | POA: Diagnosis not present

## 2016-11-29 DIAGNOSIS — H409 Unspecified glaucoma: Secondary | ICD-10-CM | POA: Diagnosis not present

## 2016-11-29 DIAGNOSIS — M545 Low back pain: Secondary | ICD-10-CM | POA: Diagnosis not present

## 2016-11-29 DIAGNOSIS — J309 Allergic rhinitis, unspecified: Secondary | ICD-10-CM | POA: Diagnosis not present

## 2016-11-29 DIAGNOSIS — E119 Type 2 diabetes mellitus without complications: Secondary | ICD-10-CM | POA: Diagnosis not present

## 2016-11-29 DIAGNOSIS — M130 Polyarthritis, unspecified: Secondary | ICD-10-CM | POA: Diagnosis not present

## 2016-11-29 DIAGNOSIS — Z0000 Encounter for general adult medical examination without abnormal findings: Secondary | ICD-10-CM | POA: Diagnosis not present

## 2016-11-29 DIAGNOSIS — E785 Hyperlipidemia, unspecified: Secondary | ICD-10-CM | POA: Diagnosis not present

## 2016-11-29 DIAGNOSIS — Z7984 Long term (current) use of oral hypoglycemic drugs: Secondary | ICD-10-CM | POA: Diagnosis not present

## 2016-11-29 DIAGNOSIS — Z973 Presence of spectacles and contact lenses: Secondary | ICD-10-CM | POA: Diagnosis not present

## 2016-11-29 DIAGNOSIS — I10 Essential (primary) hypertension: Secondary | ICD-10-CM | POA: Diagnosis not present

## 2016-11-30 IMAGING — MR MRI OF THE RIGHT SHOULDER WITHOUT CONTRAST
5 series · 40 of 40 positions shown · non-contrast
Comparison: 04/04/2014

CLINICAL DATA: Right rotator cuff surgery May 2014. Right
shoulder pain and swelling since removal of stitches.

EXAM:
MRI OF THE RIGHT SHOULDER WITHOUT CONTRAST
TECHNIQUE: Multiplanar, multisequence MR imaging of the shoulder was performed.
No intravenous contrast was administered.

[Series 3: T2 fat-sat · axial · 4.0mm · 0.55mm/px · z∈[-23,+83]mm · 8 of 25 slices shown (1 of 3)]
[im 1/25]
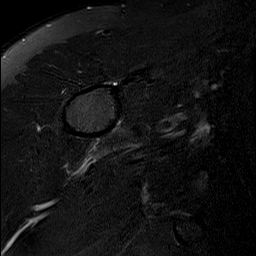
[im 4/25]
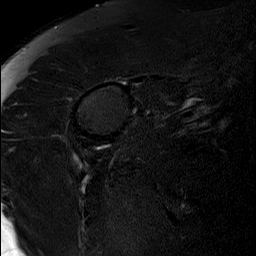
[im 7/25]
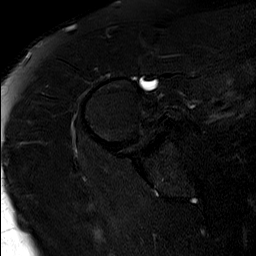
[im 11/25]
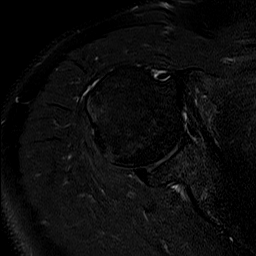
[im 14/25]
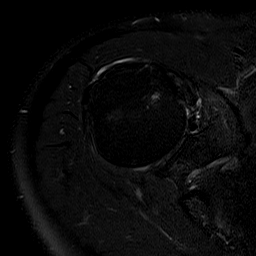
[im 18/25]
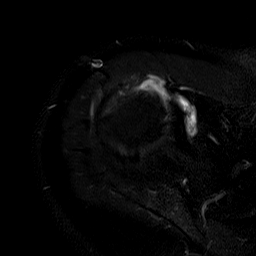
[im 21/25]
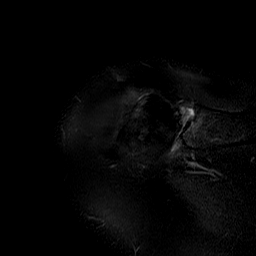
[im 25/25]
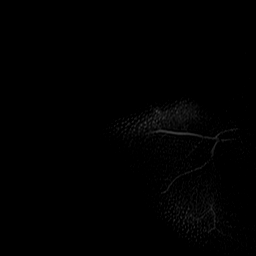

[Series 4: T2 fat-sat · oblique · 4.0mm · 0.55mm/px · 7 of 19 slices shown (2 of 3)]
[im 1/19]
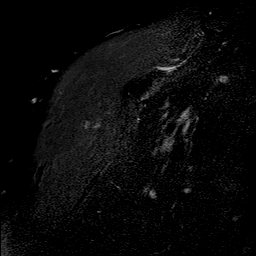
[im 4/19]
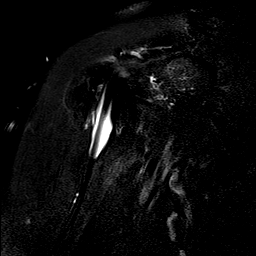
[im 7/19]
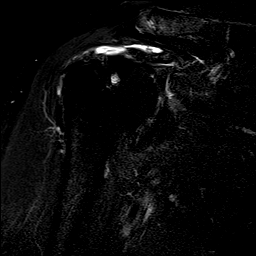
[im 10/19]
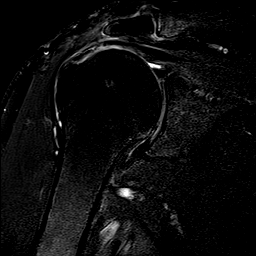
[im 13/19]
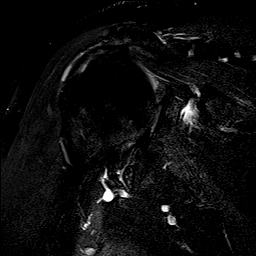
[im 16/19]
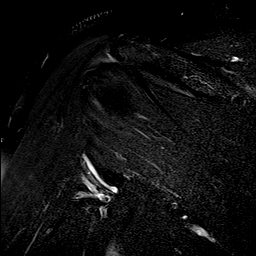
[im 19/19]
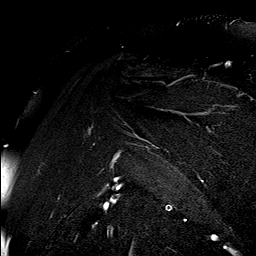

[Series 5: PD · oblique · 4.0mm · 0.55mm/px · 7 of 19 slices shown]
[im 1/19]
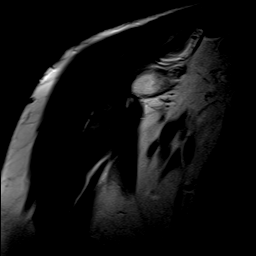
[im 4/19]
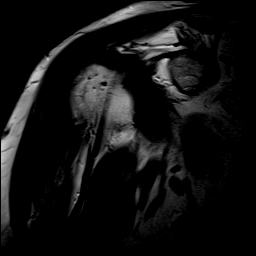
[im 7/19]
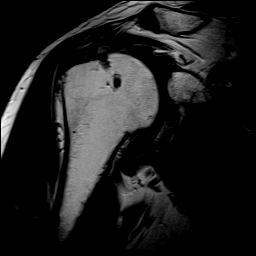
[im 10/19]
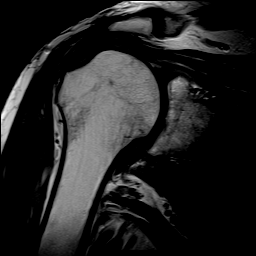
[im 13/19]
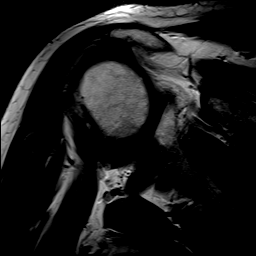
[im 16/19]
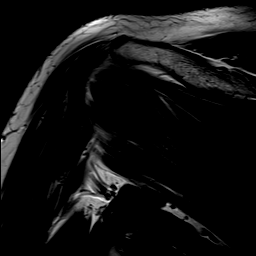
[im 19/19]
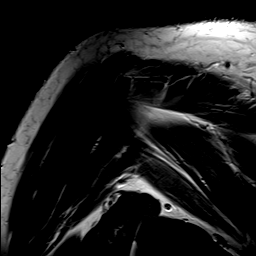

[Series 6: T1 · oblique · 4.0mm · 0.55mm/px · 9 of 25 slices shown]
[im 1/25]
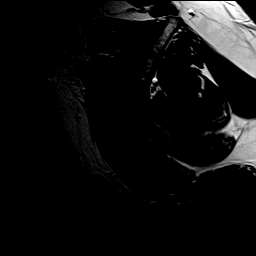
[im 4/25]
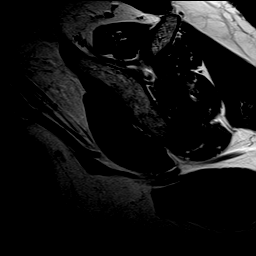
[im 7/25]
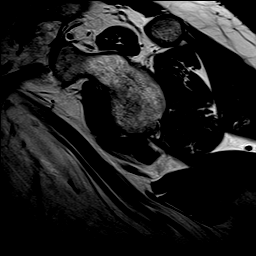
[im 10/25]
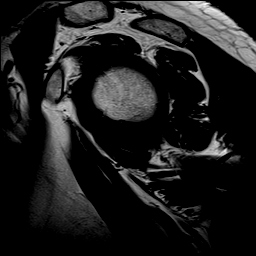
[im 13/25]
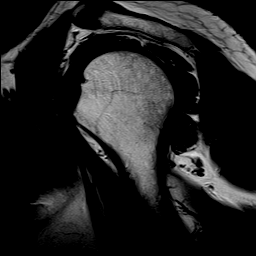
[im 16/25]
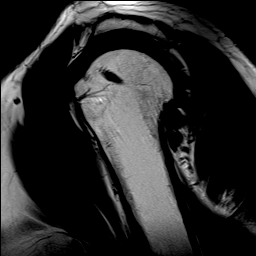
[im 19/25]
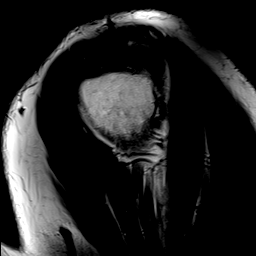
[im 22/25]
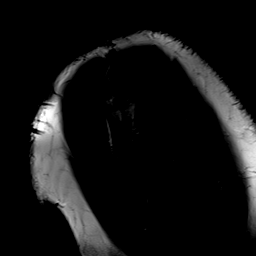
[im 25/25]
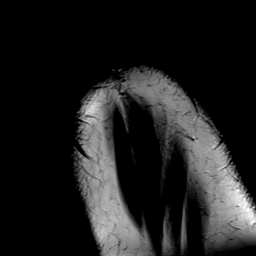

[Series 7: T2 fat-sat · oblique · 4.0mm · 0.55mm/px · 9 of 25 slices shown (3 of 3)]
[im 1/25]
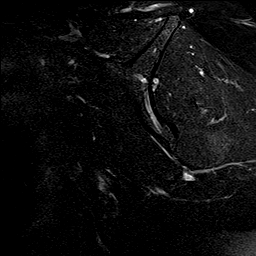
[im 4/25]
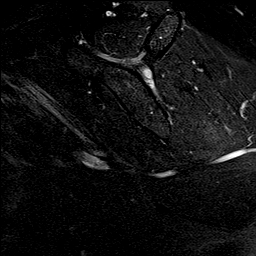
[im 7/25]
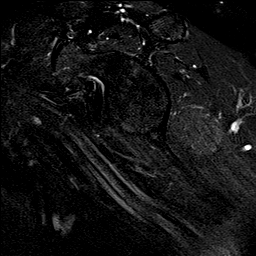
[im 10/25]
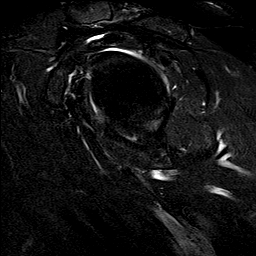
[im 13/25]
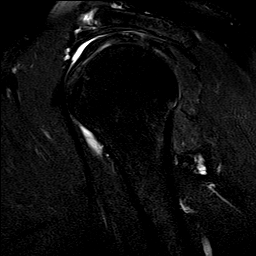
[im 16/25]
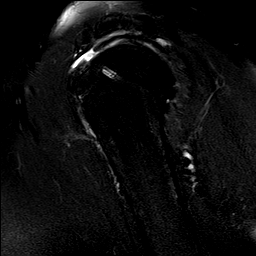
[im 19/25]
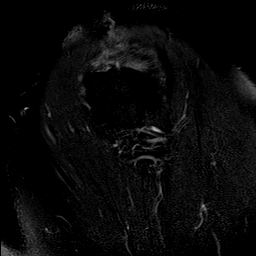
[im 22/25]
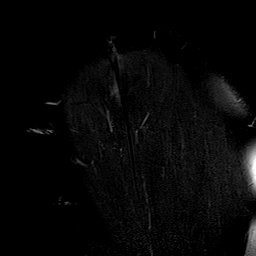
[im 25/25]
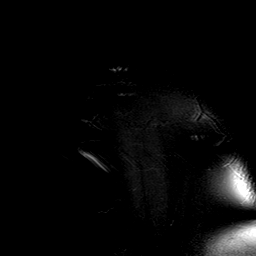

[40 of 40 positions shown; findings below may reference images not displayed]

FINDINGS: Rotator cuff: Mild tendinosis versus postsurgical changes of the
supraspinatus tendon with a full-thickness tear of the anterior
supraspinatus tendon measuring 12 mm in anterior-posterior
dimension. Mild tendinosis of the infraspinatus tendon. Teres minor
tendon is intact. Subscapularis tendon is intact.

Muscles: No atrophy or fatty replacement of nor abnormal signal
within, the muscles of the rotator cuff.

Biceps long head: Mild tendinosis of the intraarticular portion of
the biceps tendon.

Acromioclavicular Joint: Mild degenerative changes of the
acromioclavicular joint. Type II acromion. Small amount of
subacromial/ subdeltoid bursal fluid.

Glenohumeral Joint: No significant joint effusion. Mild glenohumeral
chondromalacia.

Labrum: Limited evaluation secondary to lack of intra-articular
fluid. Superior posterior labral tear.

Bones: No marrow signal abnormality. No fracture or dislocation.
Surgical anchors in the anterior superior humeral head from prior
rotator cuff repair.
IMPRESSION: 1. Prior supraspinatus tendon repair. Mild tendinosis versus
postsurgical changes of the supraspinatus tendon with a
full-thickness tear of the anterior supraspinatus tendon measuring
12 mm in anterior - posterior dimension.
2. Mild tendinosis of the infraspinatus tendon.
3. Mild tendinosis of the intraarticular portion of the long head of
the biceps tendon.
4. Mild glenohumeral chondromalacia new.

## 2017-01-28 ENCOUNTER — Ambulatory Visit (INDEPENDENT_AMBULATORY_CARE_PROVIDER_SITE_OTHER): Payer: Medicare HMO | Admitting: Family Medicine

## 2017-01-28 ENCOUNTER — Encounter: Payer: Self-pay | Admitting: Family Medicine

## 2017-01-28 VITALS — BP 165/80 | HR 90 | Temp 98.5°F | Ht 71.0 in | Wt 241.6 lb

## 2017-01-28 DIAGNOSIS — J3089 Other allergic rhinitis: Secondary | ICD-10-CM | POA: Diagnosis not present

## 2017-01-28 DIAGNOSIS — I10 Essential (primary) hypertension: Secondary | ICD-10-CM | POA: Diagnosis not present

## 2017-01-28 DIAGNOSIS — E1169 Type 2 diabetes mellitus with other specified complication: Secondary | ICD-10-CM

## 2017-01-28 DIAGNOSIS — E1165 Type 2 diabetes mellitus with hyperglycemia: Secondary | ICD-10-CM

## 2017-01-28 DIAGNOSIS — K219 Gastro-esophageal reflux disease without esophagitis: Secondary | ICD-10-CM

## 2017-01-28 DIAGNOSIS — E785 Hyperlipidemia, unspecified: Secondary | ICD-10-CM | POA: Diagnosis not present

## 2017-01-28 DIAGNOSIS — IMO0001 Reserved for inherently not codable concepts without codable children: Secondary | ICD-10-CM

## 2017-01-28 MED ORDER — LISINOPRIL 40 MG PO TABS: 40.0000 mg | ORAL_TABLET | Freq: Every day | ORAL | 3 refills | Status: DC

## 2017-01-28 MED ORDER — METFORMIN HCL 1000 MG PO TABS: 1000.0000 mg | ORAL_TABLET | Freq: Two times a day (BID) | ORAL | 3 refills | Status: DC

## 2017-01-28 MED ORDER — ATORVASTATIN CALCIUM 40 MG PO TABS: 40.0000 mg | ORAL_TABLET | Freq: Every evening | ORAL | 3 refills | Status: DC

## 2017-01-28 MED ORDER — FLUTICASONE PROPIONATE 50 MCG/ACT NA SUSP: 2.0000 | Freq: Every day | NASAL | 3 refills | Status: DC

## 2017-01-28 MED ORDER — PANTOPRAZOLE SODIUM 40 MG PO TBEC: 40.0000 mg | DELAYED_RELEASE_TABLET | Freq: Every day | ORAL | 3 refills | Status: DC

## 2017-01-28 NOTE — Patient Instructions (Addendum)
Thank you for coming to the clinic today.  1. Since Victoza too costly at this time.  Please check with insurance company for cost/coverage for the following replacement medicines instead of Victoza:  - Trulicity (Dulaglutide) - once weekly injection - Bydureon BCise or Bydureon - once weekly injection  Let me know which one is preferred and if you can afford it I will go ahead and send in new rx at that time.  -------------------  Medication Assistance Program (MAP) at [facility] Mailing Address: [facility] [address] [place] [address]  TEL: [phone]   Or Consider the [facility] for medication assistance  Last option would be for Diabetes - referral to [facility]  [facility]   Medical [place]  Address: [address] [place], [place], [place] [address]  Phone: [phone]  You will be due for FASTING BLOOD WORK (no food or drink after midnight before, only water or coffee without cream/sugar on the morning of)  - Please go ahead and schedule a "Lab Only" visit in the morning at the clinic for lab draw in 4-6 weeks  - Make sure Lab Only appointment is at least 1-2 weeks before your next appointment, so that results will be available  For Lab Results, once available within 2-3 days of blood draw, you can can log in to MyChart online to view your results and a brief explanation. Also, we can discuss results at next follow-up visit.  Please schedule a Follow-up Appointment to: Return in about 1 month (around 02/27/2017) for Annual Physical.  If you have any other questions or concerns, please feel free to call the clinic or send a message through MyChart. You may also schedule an earlier appointment if necessary.  Saralyn PilarAlexander Karamalegos, DO [facility]

## 2017-01-28 NOTE — Progress Notes (Signed)
Subjective:    Patient ID: Bruce Richardson, male    DOB: Sep 26, 1950, 66 y.o.   MRN: [id number]  Bruce Richardson is a 66 y.o. male presenting on 01/28/2017 for Diabetes (medication management)   HPI   Today patient reports he declines Hemoglobin A1c. He would like medications refilled.  CHRONIC DM, Type 2: - Last visit with me 05/24/16, for same problem Diabetes follow-up, he had prior A1c >14% in 03/2016, but he seemed to be doing better with Victoza and lifestyle, no specific changes made but he was asked to return within 2 months for A1c check, see prior notes for background information. - Interval update - he was lost to follow-up for >6 months for his diabetes, also he is no longer taking Victoza due to cost, states it is partially covered and due to his deductible it would cost >$100 and he can't afford it - Today patient reports today that he still believes his sugar is "not too bad". 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 CBGs: No CBG log today. Avg CBG 120-130s by report. No reported hypoglycemia Meds: Metformin 1000mg  BID - Off Victoza 1.8mg  daily, due to cost Tolerating well w/o side-effects Currently on ACEi - Diet: not changed, he states he "can't afford health foods" Has changed ophthalmology in past, [facility], was told he had cataracts, cannot afford removal Denies hypoglycemia, numbness or tingling, polyuria  CHRONIC HTN: Mild elevated BP today initial, reported home readings somewhat improved to 130-140s, but still admits he often gets readings 140-160/70-80s. He is not concerned about elevated BP and states, "everybody is different" regarding to their normal BP Current Meds - Lisinopril 40mg  daily - requesting refill Reports good compliance, took meds today. Tolerating well, w/o complaints Denies chest pain, dyspnea, HA, edema, dizziness / lightheadedness  HYPERLIPIDEMIA: - Last Lipid panel  06/2015, in 04/2016 he was changed from Simvastatin 20mg  to Atorvastatin 40mg  for high intensity statin given DM and abnormal lipids. Tolerating well without any myalgias - Requesting refill on statin  PMH GERD - requesting refill on PPI, tolerating well without complaint   Social History  Substance Use Topics  . Smoking status: Former Smoker    Packs/day: 1.50    Types: Cigarettes    Start date: 12/19/2001    Quit date: 07/03/2002  . Smokeless tobacco: Former NeurosurgeonUser  . Alcohol use No    Review of Systems Per HPI unless specifically indicated above     Objective:    BP (!) 165/80 (BP Location: Left Arm, Patient Position: Sitting, Cuff Size: Large)   Pulse 90   Temp 98.5 F (36.9 C) (Oral)   Ht 5\' 11"  (1.803 m)   Wt 241 lb 9.6 oz (109.6 kg)   BMI 33.70 kg/m   Wt Readings from Last 3 Encounters:  01/28/17 241 lb 9.6 oz (109.6 kg)  11/14/16 236 lb (107 kg)  05/24/16 231 lb (104.8 kg)    Physical Exam  Constitutional: He is oriented to person, place, and time. He appears well-developed and well-nourished. No distress.  Well-appearing, comfortable  HENT:  Head: Normocephalic and atraumatic.  Mouth/Throat: Oropharynx is clear and moist.  Eyes: Conjunctivae are normal. Right eye exhibits no discharge. Left eye exhibits no discharge.  Neck: Normal range of motion. Neck supple. No thyromegaly present.  Cardiovascular: Normal rate, regular rhythm, normal heart sounds and intact distal pulses.   No murmur heard. Pulmonary/Chest: Effort normal and  breath sounds normal. No respiratory distress. He has no wheezes. He has no rales.  Musculoskeletal: Normal range of motion. He exhibits no edema.  Lymphadenopathy:    He has no cervical adenopathy.  Neurological: He is alert and oriented to person, place, and time.  Skin: Skin is warm and dry. No rash noted. He is not diaphoretic. No erythema.  Psychiatric: He has a normal mood and affect. His behavior is normal.  Well groomed, good eye  contact, normal speech and thoughts. Extremely poor insight into health conditions  Nursing note and vitals reviewed.  Results for orders placed or performed in visit on 07/26/16  HM DIABETES EYE EXAM  Result Value Ref Range   HM Diabetic Eye Exam No Retinopathy No Retinopathy      Assessment & Plan:   Problem List Items Addressed This Visit    Uncontrolled type 2 diabetes mellitus without complication (HCC) - Primary    Significantly uncontrolled DM. No recent A1c, declined today, last A1c >14% 03/2016, lost to follow-up for 6 months. Off Victoza due to cost. Limited CBG checking and limited lifestyle changes. Poor health literacy and decreased interest in his own care from traditional medicine stand point of testing and medications Complication with cataracts  Plan:  1. Continue current therapy - Metformin 1000mg  BID 2. Discontinue Victoza 1.8mg  daily - since unable to afford, however this is mostly due to his deductible, as it is partially covered and would be as low as $40 otherwise. Wrote down names of 2 alternative GLP1 Trulicity and Bydureon, he is to check with ins to see if preferred / cost / coverage - and notify office if we can switch, would be once weekly injection, which he prefers. 3. 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 No CBG log today, I had asked him to check daily AM fasting and alternating days in afternoon vs evening, and he interpreted that as only check once every few days, has no written log regardless 5. Encourage improved lifestyle - low carb, low sugar diet, reduce portion size, continue improving regular exercise 6. Continue ASA, ACEi, Statin 7. DM Foot exam due next time. He is to have ophtho send report of DM eye exam 8. Follow-up 4-6 weeks for annual physical + labs ordered including A1c - explained it is very difficult to manage his diabetes if cannot have regular A1c, CBG monitoring and med adherence. Understand about cost, but  regardless he needs to play more active role in his health. Discussed prognosis of uncontrolled DM and potential progression and side effects. If he doesn't follow-up with A1c or med management, then I have limited other options to offer him.  In addition to above plan, gave him several options for discount medications - best option is likely [facility] through [facility], which should include medicare donut hole, also [facility] (he used to go there), handout given he needs to contact and complete applications, also offered that he could be referred to [facility] for charity care options, and research options with free meds or samples is possibility      Relevant Medications   metFORMIN (GLUCOPHAGE) 1000 MG tablet   atorvastatin (LIPITOR) 40 MG tablet   lisinopril (PRINIVIL,ZESTRIL) 40 MG tablet   aspirin EC 81 MG tablet   Hypertension    Elevated initial BP, declines manual repeat - Home BP readings improved but has some similar elevated readings  No known complications  Plan:  1. Continue current BP regimen - Lisinopril 40mg  daily 2. Offered additional BP meds to consider - he declines 3. Encourage improved lifestyle - low sodium diet, regular exercise 4. Resume continue monitor BP outside office, bring readings to next visit, if persistently >140/90 or new symptoms notify office sooner 5. Follow-up 4-6 weeks for annual physical + labs ordered      Relevant Medications   atorvastatin (LIPITOR) 40 MG tablet   lisinopril (PRINIVIL,ZESTRIL) 40 MG tablet   aspirin EC 81 MG tablet   Hyperlipidemia due to type 2 diabetes mellitus (HCC)    Uncontrolled cholesterol previously, no lab re-check since inc potency statin change from simva to atorv, not ideal lifestyle Last lipid panel 06/2015 Elevated ASCVD 10 yr risk score with Diabetes  Plan: 1. Continue current meds - Atorvastatin 40mg  nightly - refilled today 2. Continue ASA 81mg  for  primary ASCVD risk reduction 3. Encourage improved lifestyle - low carb/cholesterol, reduce portion size, continue improving regular exercise 4. Follow-up 4-6 weeks for annual physical + labs ordered      Relevant Medications   metFORMIN (GLUCOPHAGE) 1000 MG tablet   atorvastatin (LIPITOR) 40 MG tablet   lisinopril (PRINIVIL,ZESTRIL) 40 MG tablet   aspirin EC 81 MG tablet   GERD (gastroesophageal reflux disease)    Refilled Protonix 40mg  daily Consider de-escalating back to 20mg  daily if remains controlled, he declines at this time      Relevant Medications   pantoprazole (PROTONIX) 40 MG tablet   Allergic rhinitis    Switch nasonex to Flonase rx, new rx sent due to cost / formulary preference, he may purchase OTC Flonase if needed      Relevant Medications   fluticasone (FLONASE) 50 MCG/ACT nasal spray      Meds ordered this encounter  Medications  . pantoprazole (PROTONIX) 40 MG tablet    Sig: Take 1 tablet (40 mg total) by mouth daily before breakfast.    Dispense:  90 tablet    Refill:  3  . metFORMIN (GLUCOPHAGE) 1000 MG tablet    Sig: Take 1 tablet (1,000 mg total) by mouth 2 (two) times daily with a meal.    Dispense:  180 tablet    Refill:  3  . atorvastatin (LIPITOR) 40 MG tablet    Sig: Take 1 tablet (40 mg total) by mouth every evening.    Dispense:  90 tablet    Refill:  3  . lisinopril (PRINIVIL,ZESTRIL) 40 MG tablet    Sig: Take 1 tablet (40 mg total) by mouth daily.    Dispense:  90 tablet    Refill:  3  . fluticasone (FLONASE) 50 MCG/ACT nasal spray    Sig: Place 2 sprays into both nostrils daily. Use for 4-6 weeks then stop and use seasonally or as needed.    Dispense:  16 g    Refill:  3  . aspirin EC 81 MG tablet    Sig: Take 1 tablet (81 mg total) by mouth daily.    Follow up plan: Return in about 1 month (around 02/27/2017) for Annual Physical.  Saralyn Pilar, DO [facility] 01/29/2017, 6:48  AM

## 2017-01-29 ENCOUNTER — Other Ambulatory Visit: Payer: Self-pay | Admitting: Family Medicine

## 2017-01-29 DIAGNOSIS — E1169 Type 2 diabetes mellitus with other specified complication: Secondary | ICD-10-CM

## 2017-01-29 DIAGNOSIS — Z0000 Encounter for general adult medical examination without abnormal findings: Secondary | ICD-10-CM

## 2017-01-29 DIAGNOSIS — R799 Abnormal finding of blood chemistry, unspecified: Secondary | ICD-10-CM

## 2017-01-29 DIAGNOSIS — E1165 Type 2 diabetes mellitus with hyperglycemia: Secondary | ICD-10-CM

## 2017-01-29 DIAGNOSIS — E785 Hyperlipidemia, unspecified: Secondary | ICD-10-CM

## 2017-01-29 DIAGNOSIS — IMO0001 Reserved for inherently not codable concepts without codable children: Secondary | ICD-10-CM

## 2017-01-29 DIAGNOSIS — I10 Essential (primary) hypertension: Secondary | ICD-10-CM

## 2017-01-29 DIAGNOSIS — Z125 Encounter for screening for malignant neoplasm of prostate: Secondary | ICD-10-CM

## 2017-01-29 MED ORDER — ASPIRIN EC 81 MG PO TBEC: 81.0000 mg | DELAYED_RELEASE_TABLET | Freq: Every day | ORAL | Status: AC

## 2017-01-29 NOTE — Assessment & Plan Note (Addendum)
Switch nasonex to Flonase rx, new rx sent due to cost / formulary preference, he may purchase OTC Flonase if needed

## 2017-01-29 NOTE — Assessment & Plan Note (Signed)
Refilled Protonix 40mg  daily Consider de-escalating back to 20mg  daily if remains controlled, he declines at this time

## 2017-01-29 NOTE — Assessment & Plan Note (Addendum)
Significantly uncontrolled DM. No recent A1c, declined today, last A1c >14% 03/2016, lost to follow-up for 6 months. Off Victoza due to cost. Limited CBG checking and limited lifestyle changes. Poor health literacy and decreased interest in his own care from traditional medicine stand point of testing and medications Complication with cataracts  Plan:  1. Continue current therapy - Metformin 1000mg  BID 2. Discontinue Victoza 1.8mg  daily - since unable to afford, however this is mostly due to his deductible, as it is partially covered and would be as low as $40 otherwise. Wrote down names of 2 alternative GLP1 Trulicity and Bydureon, he is to check with ins to see if preferred / cost / coverage - and notify office if we can switch, would be once weekly injection, which he prefers. 3. 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 No CBG log today, I had asked him to check daily AM fasting and alternating days in afternoon vs evening, and he interpreted that as only check once every few days, has no written log regardless 5. Encourage improved lifestyle - low carb, low sugar diet, reduce portion size, continue improving regular exercise 6. Continue ASA, ACEi, Statin 7. DM Foot exam due next time. He is to have ophtho send report of DM eye exam 8. Follow-up 4-6 weeks for annual physical + labs ordered including A1c - explained it is very difficult to manage his diabetes if cannot have regular A1c, CBG monitoring and med adherence. Understand about cost, but regardless he needs to play more active role in his health. Discussed prognosis of uncontrolled DM and potential progression and side effects. If he doesn't follow-up with A1c or med management, then I have limited other options to offer him.  In addition to above plan, gave him several options for discount medications - best option is likely [facility], which should include medicare  donut hole, also [facility] (he used to go there), handout given he needs to contact and complete applications, also offered that he could be [facility] for charity care options, and research options with free meds or samples is possibility

## 2017-01-29 NOTE — Assessment & Plan Note (Signed)
Uncontrolled cholesterol previously, no lab re-check since inc potency statin change from simva to atorv, not ideal lifestyle Last lipid panel 06/2015 Elevated ASCVD 10 yr risk score with Diabetes  Plan: 1. Continue current meds - Atorvastatin 40mg  nightly - refilled today 2. Continue ASA 81mg  for primary ASCVD risk reduction 3. Encourage improved lifestyle - low carb/cholesterol, reduce portion size, continue improving regular exercise 4. Follow-up 4-6 weeks for annual physical + labs ordered

## 2017-01-29 NOTE — Assessment & Plan Note (Signed)
Elevated initial BP, declines manual repeat - Home BP readings improved but has some similar elevated readings  No known complications    Plan:  1. Continue current BP regimen - Lisinopril 40mg  daily 2. Offered additional BP meds to consider - he declines 3. Encourage improved lifestyle - low sodium diet, regular exercise 4. Resume continue monitor BP outside office, bring readings to next visit, if persistently >140/90 or new symptoms notify office sooner 5. Follow-up 4-6 weeks for annual physical + labs ordered

## 2017-02-04 ENCOUNTER — Ambulatory Visit (INDEPENDENT_AMBULATORY_CARE_PROVIDER_SITE_OTHER): Payer: Medicare HMO

## 2017-02-04 VITALS — BP 138/80 | HR 72 | Temp 98.1°F | Resp 16 | Ht 71.0 in | Wt 239.0 lb

## 2017-02-04 DIAGNOSIS — Z0000 Encounter for general adult medical examination without abnormal findings: Secondary | ICD-10-CM | POA: Diagnosis not present

## 2017-02-04 DIAGNOSIS — Z1211 Encounter for screening for malignant neoplasm of colon: Secondary | ICD-10-CM

## 2017-02-04 DIAGNOSIS — Z1159 Encounter for screening for other viral diseases: Secondary | ICD-10-CM

## 2017-02-04 NOTE — Progress Notes (Signed)
Subjective:   Bruce Richardson is a 66 y.o. male who presents for Medicare Annual/Subsequent preventive examination.  Review of Systems:   Cardiac Risk Factors include: advanced age (>7555men, 2>65 women);male gender;diabetes mellitus;hypertension;family history of premature cardiovascular disease;dyslipidemia;obesity (BMI >30kg/m2)     Objective:    Vitals: BP 138/80 (BP Location: Left Arm, Patient Position: Sitting)   Pulse 72   Temp 98.1 F (36.7 C)   Resp 16   Ht 5\' 11"  (1.803 m)   Wt 239 lb (108.4 kg)   BMI 33.33 kg/m   Body mass index is 33.33 kg/m.  Tobacco History  Smoking Status  . Former Smoker  . Packs/day: 1.50  . Types: Cigarettes  . Start date: 12/19/2001  . Quit date: 07/03/2002  Smokeless Tobacco  . Former User    Comment: quit smoking in 06/2001     Counseling given: Not Answered   Past Medical History:  Diagnosis Date  . Arthritis   . Bulging lumbar disc    lower back  . Complication of anesthesia    kept moving while under anesthesia during colonoscopy, hard to put under.  . Diabetes mellitus without complication (HCC)   . GERD (gastroesophageal reflux disease)   . Hyperlipidemia   . Hypertension    Past Surgical History:  Procedure Laterality Date  . CIRCUMCISION N/A 07/24/2015   Procedure: CIRCUMCISION ADULT;  Surgeon: Hildred LaserBrian James Budzyn, MD;  Location: [facility] OR[facility];  Service: Urology;  Laterality: N/A;  . SHOULDER ARTHROSCOPY Bilateral   . SHOULDER ARTHROSCOPY Right 12/22/2014   Procedure: RIGHT SHOULDER ARTHROSCOPY /DECOMPRESSION/ROTATOR CUFF REPAIR OF RECURRENT ROTATOR CUFF TEAR;  Surgeon: Christena FlakeJohn J Poggi, MD;  Location: [facility] ORS;  Service: Orthopedics;  Laterality: Right;   Family History  Problem Relation Age of Onset  . Alzheimer's disease Mother   . Diabetes Mother   . Hypertension Mother   . Cancer Father   . Heart failure Brother   . Diabetes Brother   . Hypertension Brother   . Heart disease Brother   . Prostate cancer Other    unknown  . Bladder Cancer Other        unknown  . Heart disease Son   . Diabetes Sister    History  Sexual Activity  . Sexual activity: Not on file    Outpatient Encounter Prescriptions as of 02/04/2017  Medication Sig  . ACCU-CHEK SOFTCLIX LANCETS lancets 2 (two) times daily. for testing  . aspirin EC 81 MG tablet Take 1 tablet (81 mg total) by mouth daily.  Marland Kitchen. atorvastatin (LIPITOR) 40 MG tablet Take 1 tablet (40 mg total) by mouth every evening.  . fluticasone (FLONASE) 50 MCG/ACT nasal spray Place 2 sprays into both nostrils daily. Use for 4-6 weeks then stop and use seasonally or as needed.  Marland Kitchen. glucose blood test strip Check blood sugar twice daily. Dx.E11.9  . lisinopril (PRINIVIL,ZESTRIL) 40 MG tablet Take 1 tablet (40 mg total) by mouth daily.  . metFORMIN (GLUCOPHAGE) 1000 MG tablet Take 1 tablet (1,000 mg total) by mouth 2 (two) times daily with a meal.  . ONETOUCH DELICA LANCETS 33G MISC 1 each by Does not apply route 2 (two) times daily. Dx: E11.9  . pantoprazole (PROTONIX) 40 MG tablet Take 1 tablet (40 mg total) by mouth daily before breakfast.  . baclofen (LIORESAL) 10 MG tablet Take 0.5-1 tablets (5-10 mg total) by mouth 3 (three) times daily as needed for muscle spasms. (Patient not taking: Reported on 01/28/2017)  . Insulin Pen  Needle (PEN NEEDLES) 32G X 4 MM MISC 1 Device by Does not apply route every 7 (seven) days. (Patient not taking: Reported on 02/04/2017)  . Liraglutide 18 MG/3ML SOPN 0.6 mg Vance qd x 1 wk, then 1.2 mg  qd. (Patient not taking: Reported on 01/28/2017)  . naproxen (NAPROSYN) 500 MG tablet Take 1 tablet (500 mg total) by mouth 2 (two) times daily with a meal. For 2-4 weeks then as needed (Patient not taking: Reported on 02/04/2017)   No facility-administered encounter medications on file as of 02/04/2017.     Activities of Daily Living In your present state of health, do you have any difficulty performing the following activities: 02/04/2017 11/14/2016    Hearing? Y N  Vision? Y N  Difficulty concentrating or making decisions? Y N  Walking or climbing stairs? Y N  Dressing or bathing? N N  Doing errands, shopping? N N  Preparing Food and eating ? N -  Using the Toilet? N -  In the past six months, have you accidently leaked urine? N -  Do you have problems with loss of bowel control? N -  Managing your Medications? N -  Managing your Finances? N -  Housekeeping or managing your Housekeeping? N -  Some recent data might be hidden    Patient Care Team: Smitty Cords, DO as PCP - General (Family Medicine) Juanetta Gosling Teresita Madura., MD (Family Medicine)   Assessment:     Exercise Activities and Dietary recommendations Current Exercise Habits: The patient does not participate in regular exercise at present  Goals    . Exercise          Continue walking.     . Increase water intake          Recommend drinking at least 4-5 glasses of water a day      Fall Risk Fall Risk  02/04/2017 11/14/2016 08/04/2015 07/04/2015 04/12/2015  Falls in the past year? No Yes No No No  Injury with Fall? - No - - -   Depression Screen PHQ 2/9 Scores 02/04/2017 11/14/2016 08/04/2015 07/04/2015  PHQ - 2 Score 5 0 0 0  PHQ- 9 Score 18 - - -    Cognitive Function MMSE - Mini Mental State Exam 04/12/2015  Orientation to time 5  Orientation to Place 5  Registration 3  Attention/ Calculation 5  Recall 3  Language- name 2 objects 2  Language- repeat 1  Language- follow 3 step command 3  Language- read & follow direction 0  Write a sentence 0  Copy design 0  Total score 27     6CIT Screen 02/04/2017  What Year? 0 points  What month? 0 points  What time? 0 points  Count back from 20 0 points  Months in reverse 0 points  Repeat phrase 0 points  Total Score 0     There is no immunization history on file for this patient. Screening Tests Health Maintenance  Topic Date Due  . HEMOGLOBIN A1C  02/25/2017 (Originally 10/12/2016)  .  Hepatitis C Screening  02/25/2017 (Originally 06-Jan-1951)  . PNA vac Low Risk Adult (1 of 2 - PCV13) 04/11/2017 (Originally 02/16/2015)  . INFLUENZA VACCINE  04/19/2017 (Originally 03/19/2017)  . TETANUS/TDAP  05/24/2017 (Originally 02/15/1969)  . COLONOSCOPY  05/25/2019 (Originally 02/16/2000)  . FOOT EXAM  04/11/2017  . OPHTHALMOLOGY EXAM  06/26/2017      Plan:    I have personally reviewed and addressed the Medicare Annual Wellness questionnaire  and have noted the following in the patient's chart:  A. Medical and social history B. Use of alcohol, tobacco or illicit drugs  C. Current medications and supplements D. Functional ability and status E.  Nutritional status F.  Physical activity G. Advance directives H. List of other physicians I.  Hospitalizations, surgeries, and ER visits in previous 12 months J.  Vitals K. Screenings such as hearing and vision if needed, cognitive and depression L. Referrals and appointments  In addition, I have reviewed and discussed with patient certain preventive protocols, quality metrics, and best practice recommendations. A written personalized care plan for preventive services as well as general preventive health recommendations were provided to patient.   Signed,  Marin Roberts, LPN Nurse Health Advisor   MD Recommendations: PHQ-9 score was 18 today- discussed with Wilhelmina Mcardle, patient has no sucicial ideations today but has had them in the past. He was given counselor information and will make a follow up appt with you within the week if he decides not to go to the counselor.

## 2017-02-04 NOTE — Patient Instructions (Addendum)
Bruce Richardson , Thank you for taking time to come for your Medicare Wellness Visit. I appreciate your ongoing commitment to your health goals. Please review the following plan we discussed and let me know if I can assist you in the future.   Screening recommendations/referrals: Colonoscopy: due now- cologuard referral done today Recommended yearly ophthalmology/optometry visit for glaucoma screening and checkup Recommended yearly dental visit for hygiene and checkup  Vaccinations: Influenza vaccine: due 04/2017 Pneumococcal vaccine: due now -declined  Tdap vaccine: due, check with your insurance company for coverage Shingles vaccine: due, check with your insurance company for coverage  Advanced directives: Advance directive discussed with you today. I have provided a copy for you to complete at home and have notarized. Once this is complete please bring a copy in to our office so we can scan it into your chart.  Conditions/risks identified: Recommend drinking at least 4-5 glasses of water a day  Next appointment: Follow up on 7/10/2018at 8am for labs, follow up on 03/03/2017 at 9 am with Dr.Karamalegos. Follow up in one year for your annual wellness exam    Counselors- Please call and make an appt with either: [facility](Behavioral Health) [place] [address], [place], [place] [address] Phone: [phone]  [facility], available walk-in 9am-4pm M-F [address] [place], [place] [address] Hours: 9am - 4pm (M-F, walk in available) [phone]     Preventive Care 65 Years and Older, Male Preventive care refers to lifestyle choices and visits with your health care provider that can promote health and wellness. What does preventive care include?  A yearly physical exam. This is also called an annual well check.  Dental exams once or twice a year.  Routine eye exams. Ask your health care provider how often you should have your eyes checked.  Personal  lifestyle choices, including:  Daily care of your teeth and gums.  Regular physical activity.  Eating a healthy diet.  Avoiding tobacco and drug use.  Limiting alcohol use.  Practicing safe sex.  Taking low doses of aspirin every day.  Taking vitamin and mineral supplements as recommended by your health care provider. What happens during an annual well check? The services and screenings done by your health care provider during your annual well check will depend on your age, overall health, lifestyle risk factors, and family history of disease. Counseling  Your health care provider may ask you questions about your:  Alcohol use.  Tobacco use.  Drug use.  Emotional well-being.  Home and relationship well-being.  Sexual activity.  Eating habits.  History of falls.  Memory and ability to understand (cognition).  Work and work Astronomerenvironment. Screening  You may have the following tests or measurements:  Height, weight, and BMI.  Blood pressure.  Lipid and cholesterol levels. These may be checked every 5 years, or more frequently if you are over 67 years old.  Skin check.  Lung cancer screening. You may have this screening every year starting at age 67 if you have a 30-pack-year history of smoking and currently smoke or have quit within the past 15 years.  Fecal occult blood test (FOBT) of the stool. You may have this test every year starting at age 67.  Flexible sigmoidoscopy or colonoscopy. You may have a sigmoidoscopy every 5 years or a colonoscopy every 10 years starting at age 67.  Prostate cancer screening. Recommendations will vary depending on your family history and other risks.  Hepatitis C blood test.  Hepatitis B  blood test.  Sexually transmitted disease (STD) testing.  Diabetes screening. This is done by checking your blood sugar (glucose) after you have not eaten for a while (fasting). You may have this done every 1-3 years.  Abdominal aortic  aneurysm (AAA) screening. You may need this if you are a current or former smoker.  Osteoporosis. You may be screened starting at age 96 if you are at high risk. Talk with your health care provider about your test results, treatment options, and if necessary, the need for more tests. Vaccines  Your health care provider may recommend certain vaccines, such as:  Influenza vaccine. This is recommended every year.  Tetanus, diphtheria, and acellular pertussis (Tdap, Td) vaccine. You may need a Td booster every 10 years.  Zoster vaccine. You may need this after age 107.  Pneumococcal 13-valent conjugate (PCV13) vaccine. One dose is recommended after age 37.  Pneumococcal polysaccharide (PPSV23) vaccine. One dose is recommended after age 81. Talk to your health care provider about which screenings and vaccines you need and how often you need them. This information is not intended to replace advice given to you by your health care provider. Make sure you discuss any questions you have with your health care provider. Document Released: 09/01/2015 Document Revised: 04/24/2016 Document Reviewed: 06/06/2015 Elsevier Interactive Patient Education  2017 [place] Prevention in the Home Falls can cause injuries. They can happen to people of all ages. There are many things you can do to make your home safe and to help prevent falls. What can I do on the outside of my home?  Regularly fix the edges of walkways and driveways and fix any cracks.  Remove anything that might make you trip as you walk through a door, such as a raised step or threshold.  Trim any bushes or trees on the path to your home.  Use bright outdoor lighting.  Clear any walking paths of anything that might make someone trip, such as rocks or tools.  Regularly check to see if handrails are loose or broken. Make sure that both sides of any steps have handrails.  Any raised decks and porches should have guardrails on  the edges.  Have any leaves, snow, or ice cleared regularly.  Use sand or salt on walking paths during winter.  Clean up any spills in your garage right away. This includes oil or grease spills. What can I do in the bathroom?  Use night lights.  Install grab bars by the toilet and in the tub and shower. Do not use towel bars as grab bars.  Use non-skid mats or decals in the tub or shower.  If you need to sit down in the shower, use a plastic, non-slip stool.  Keep the floor dry. Clean up any water that spills on the floor as soon as it happens.  Remove soap buildup in the tub or shower regularly.  Attach bath mats securely with double-sided non-slip rug tape.  Do not have throw rugs and other things on the floor that can make you trip. What can I do in the bedroom?  Use night lights.  Make sure that you have a light by your bed that is easy to reach.  Do not use any sheets or blankets that are too big for your bed. They should not hang down onto the floor.  Have a firm chair that has side arms. You can use this for support while you get dressed.  Do not have throw  rugs and other things on the floor that can make you trip. What can I do in the kitchen?  Clean up any spills right away.  Avoid walking on wet floors.  Keep items that you use a lot in easy-to-reach places.  If you need to reach something above you, use a strong step stool that has a grab bar.  Keep electrical cords out of the way.  Do not use floor polish or wax that makes floors slippery. If you must use wax, use non-skid floor wax.  Do not have throw rugs and other things on the floor that can make you trip. What can I do with my stairs?  Do not leave any items on the stairs.  Make sure that there are handrails on both sides of the stairs and use them. Fix handrails that are broken or loose. Make sure that handrails are as long as the stairways.  Check any carpeting to make sure that it is firmly  attached to the stairs. Fix any carpet that is loose or worn.  Avoid having throw rugs at the top or bottom of the stairs. If you do have throw rugs, attach them to the floor with carpet tape.  Make sure that you have a light switch at the top of the stairs and the bottom of the stairs. If you do not have them, ask someone to add them for you. What else can I do to help prevent falls?  Wear shoes that:  Do not have high heels.  Have rubber bottoms.  Are comfortable and fit you well.  Are closed at the toe. Do not wear sandals.  If you use a stepladder:  Make sure that it is fully opened. Do not climb a closed stepladder.  Make sure that both sides of the stepladder are locked into place.  Ask someone to hold it for you, if possible.  Clearly mark and make sure that you can see:  Any grab bars or handrails.  First and last steps.  Where the edge of each step is.  Use tools that help you move around (mobility aids) if they are needed. These include:  Canes.  Walkers.  Scooters.  Crutches.  Turn on the lights when you go into a dark area. Replace any light bulbs as soon as they burn out.  Set up your furniture so you have a clear path. Avoid moving your furniture around.  If any of your floors are uneven, fix them.  If there are any pets around you, be aware of where they are.  Review your medicines with your doctor. Some medicines can make you feel dizzy. This can increase your chance of falling. Ask your doctor what other things that you can do to help prevent falls. This information is not intended to replace advice given to you by your health care provider. Make sure you discuss any questions you have with your health care provider. Document Released: 06/01/2009 Document Revised: 01/11/2016 Document Reviewed: 09/09/2014 Elsevier Interactive Patient Education  2017 [employer].

## 2017-02-18 ENCOUNTER — Telehealth: Payer: Self-pay

## 2017-02-18 NOTE — Telephone Encounter (Signed)
Patient called this morning with questions regarding his cologuard. Patient states with his metformin he has loose stools and the sample is supposed to be a formed stool. Informed pt this nurse would discuss it with Dr.Karamalegos. Patient also inquired about the community resource referral. Will follow up with the care guide.  Spoke with Dr.Karamalegos, pt is okay to stop metformin for a couple days to see if his stools improve for cologuard testing. Would also like to check with [employer] Rep. For clarification on stool formation and testing. Left message with Dierdre HarnessMatthew Sossman with [employer].   Care guide to contact patient regarding community resources

## 2017-02-25 ENCOUNTER — Other Ambulatory Visit: Payer: Medicare HMO

## 2017-02-25 DIAGNOSIS — IMO0001 Reserved for inherently not codable concepts without codable children: Secondary | ICD-10-CM

## 2017-02-25 DIAGNOSIS — I10 Essential (primary) hypertension: Secondary | ICD-10-CM

## 2017-02-25 DIAGNOSIS — Z0000 Encounter for general adult medical examination without abnormal findings: Secondary | ICD-10-CM

## 2017-02-25 DIAGNOSIS — R799 Abnormal finding of blood chemistry, unspecified: Secondary | ICD-10-CM

## 2017-02-25 DIAGNOSIS — E785 Hyperlipidemia, unspecified: Secondary | ICD-10-CM

## 2017-02-25 DIAGNOSIS — E1165 Type 2 diabetes mellitus with hyperglycemia: Secondary | ICD-10-CM | POA: Diagnosis not present

## 2017-02-25 DIAGNOSIS — E1169 Type 2 diabetes mellitus with other specified complication: Secondary | ICD-10-CM | POA: Diagnosis not present

## 2017-02-25 DIAGNOSIS — Z125 Encounter for screening for malignant neoplasm of prostate: Secondary | ICD-10-CM

## 2017-02-25 DIAGNOSIS — Z1159 Encounter for screening for other viral diseases: Secondary | ICD-10-CM

## 2017-02-25 LAB — CBC WITH DIFFERENTIAL/PLATELET
Basophils Absolute: 76 cells/uL (ref 0–200)
Basophils Relative: 1 %
Eosinophils Absolute: 456 cells/uL (ref 15–500)
Eosinophils Relative: 6 %
HCT: 47.1 % (ref 38.5–50.0)
Hemoglobin: 15.3 g/dL (ref 13.2–17.1)
Lymphocytes Relative: 41 %
Lymphs Abs: 3116 cells/uL (ref 850–3900)
MCH: 29.2 pg (ref 27.0–33.0)
MCHC: 32.5 g/dL (ref 32.0–36.0)
MCV: 89.9 fL (ref 80.0–100.0)
MPV: 9.7 fL (ref 7.5–12.5)
Monocytes Absolute: 608 cells/uL (ref 200–950)
Monocytes Relative: 8 %
Neutro Abs: 3344 cells/uL (ref 1500–7800)
Neutrophils Relative %: 44 %
Platelets: 259 10*3/uL (ref 140–400)
RBC: 5.24 MIL/uL (ref 4.20–5.80)
RDW: 13 % (ref 11.0–15.0)
WBC: 7.6 10*3/uL (ref 3.8–10.8)

## 2017-02-26 DIAGNOSIS — Z1212 Encounter for screening for malignant neoplasm of rectum: Secondary | ICD-10-CM | POA: Diagnosis not present

## 2017-02-26 DIAGNOSIS — Z1211 Encounter for screening for malignant neoplasm of colon: Secondary | ICD-10-CM | POA: Diagnosis not present

## 2017-02-26 LAB — COMPLETE METABOLIC PANEL WITH GFR
ALT: 15 U/L (ref 9–46)
AST: 13 U/L (ref 10–35)
Albumin: 4.2 g/dL (ref 3.6–5.1)
Alkaline Phosphatase: 76 U/L (ref 40–115)
BUN: 15 mg/dL (ref 7–25)
CO2: 23 mmol/L (ref 20–31)
Calcium: 9.8 mg/dL (ref 8.6–10.3)
Chloride: 103 mmol/L (ref 98–110)
Creat: 1.1 mg/dL (ref 0.70–1.25)
GFR, Est African American: 80 mL/min (ref >=60)
GFR, Est Non African American: 69 mL/min (ref >=60)
Glucose, Bld: 103 mg/dL — ABNORMAL HIGH (ref 65–99)
Potassium: 4.6 mmol/L (ref 3.5–5.3)
Sodium: 138 mmol/L (ref 135–146)
Total Bilirubin: 0.9 mg/dL (ref 0.2–1.2)
Total Protein: 7.2 g/dL (ref 6.1–8.1)

## 2017-02-26 LAB — LIPID PANEL
Cholesterol: 137 mg/dL (ref <200)
HDL: 32 mg/dL — ABNORMAL LOW (ref >40)
LDL Cholesterol: 83 mg/dL (ref <100)
Total CHOL/HDL Ratio: 4.3 Ratio (ref <5.0)
Triglycerides: 111 mg/dL (ref <150)
VLDL: 22 mg/dL (ref <30)

## 2017-02-26 LAB — HEMOGLOBIN A1C
Hgb A1c MFr Bld: 7.5 % — ABNORMAL HIGH (ref <5.7)
Mean Plasma Glucose: 169 mg/dL

## 2017-02-26 LAB — PSA, TOTAL WITH REFLEX TO PSA, FREE: PSA, Total: 0.5 ng/mL (ref <=4.0)

## 2017-02-26 LAB — HEPATITIS C ANTIBODY: HCV Ab: NEGATIVE

## 2017-03-03 ENCOUNTER — Ambulatory Visit: Payer: Medicare HMO | Admitting: Family Medicine

## 2017-03-03 VITALS — Resp 16 | Ht 71.0 in | Wt 242.8 lb

## 2017-03-04 LAB — COLOGUARD: Cologuard: NEGATIVE

## 2017-03-04 NOTE — Progress Notes (Signed)
Patient re-scheduled apt for today after some initial intake and some vitals. Patient was never seen or evaluated.  Saralyn PilarAlexander Karamalegos, DO [facility] 03/03/2017, 11:05 AM

## 2017-03-05 ENCOUNTER — Encounter: Payer: Self-pay | Admitting: Family Medicine

## 2017-03-19 ENCOUNTER — Encounter: Payer: Self-pay | Admitting: Family Medicine

## 2017-03-19 ENCOUNTER — Ambulatory Visit (INDEPENDENT_AMBULATORY_CARE_PROVIDER_SITE_OTHER): Payer: Medicare HMO | Admitting: Family Medicine

## 2017-03-19 VITALS — BP 160/80 | HR 88 | Temp 98.6°F | Resp 16 | Ht 71.0 in | Wt 239.0 lb

## 2017-03-19 DIAGNOSIS — E119 Type 2 diabetes mellitus without complications: Secondary | ICD-10-CM | POA: Diagnosis not present

## 2017-03-19 DIAGNOSIS — M4726 Other spondylosis with radiculopathy, lumbar region: Secondary | ICD-10-CM

## 2017-03-19 DIAGNOSIS — R69 Illness, unspecified: Secondary | ICD-10-CM | POA: Diagnosis not present

## 2017-03-19 DIAGNOSIS — F331 Major depressive disorder, recurrent, moderate: Secondary | ICD-10-CM

## 2017-03-19 DIAGNOSIS — N400 Enlarged prostate without lower urinary tract symptoms: Secondary | ICD-10-CM | POA: Diagnosis not present

## 2017-03-19 DIAGNOSIS — M75122 Complete rotator cuff tear or rupture of left shoulder, not specified as traumatic: Secondary | ICD-10-CM

## 2017-03-19 DIAGNOSIS — I10 Essential (primary) hypertension: Secondary | ICD-10-CM

## 2017-03-19 MED ORDER — NAPROXEN 500 MG PO TABS: 500.0000 mg | ORAL_TABLET | Freq: Two times a day (BID) | ORAL | 2 refills | Status: DC

## 2017-03-19 NOTE — Assessment & Plan Note (Signed)
Dramatically improved A1c now 7.5 (previously >14 in 03/2016), uncertain of accuracy of results, since he has been on less medicines over past >6 months, unable to afford Victoza, and not improved lifestyle/diet. Possible inaccurate reading A1c >14 in 03/2016, however home CBG seems to support his reading. - Lost to follow-up for >6 months despite recommendations - Poor health literacy and decreased interest in his own care Complication with R cataracts  Plan:  1. Continue current therapy - Metformin 1000mg  BID 2. Remains off Victoza - no update on if other GLP1 more affordable. He has high deductible and declines other offers of other med options, due to cost 3. 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 No CBG log today 5. Encourage improved lifestyle - low carb, low sugar diet, reduce portion size, continue improving regular exercise 6. Continue ASA, ACEi, Statin 7. Due for DM eye - needs to f/u with ophtho also with cataracts, admits limited financial barrier 8. Follow-up q 6 months for DM A1c - declines 3 month follow-up  Additionally - in future if cost is limiting his care, then again will advise financial resources already given to patient, such as [facility], which should include medicare donut hole, also [facility] (he [facility] to go there), [facility] for charity care options, and research options with free meds or samples is possibility - however he declines due to the estate partially in his name currently

## 2017-03-19 NOTE — Assessment & Plan Note (Signed)
Elevated BP Declines adjust BP med today, states that BP is normal at home, but elevated here

## 2017-03-19 NOTE — Progress Notes (Signed)
Subjective:    Patient ID: Bruce Richardson, male    DOB: Mar 14, 1951, 66 y.o.   MRN: [id number]  Bruce Richardson is a 66 y.o. male presenting on 03/19/2017 for Diabetes (follow up)  He was initially scheduled for a Complete Physical (for medicare patient) on 7/16 but he re-scheduled from that appointment. He had blood work done on 02/25/17 and had results.  HPI  Here today for Diabetes follow-up and review of labs. He has complaint about "why annual physical is needed" and states that he "already has blood test results and knows that they are good" and asking why he needed to come in today.  CHRONIC DM, Type 2: - Last visit with me 01/28/17, for same problem Diabetes follow-up, he had prior A1c >14% in 03/2016 he was out of medicine for while and had high home CBGs at that time, however patient attributes this A1c reading to a "problem with your A1c machine", which has since been replaced, he was doing better on Victoza and other regimen, then cost became too much and due to his insurance he did not meet deductible and has been off Victoza now for >3 or more months. See prior note for details. - Explains financial hardship with partial owner of house left by his Father, but other family will not sell it to him, therefore his funds are tied up and he cannot get charity care or benefits due to the asset value - Regarding financial med problem: Last visit he was advised to check cost of several meds instead of victoza, and he did not do this, also he was advised to try the Medication Assistance Program (MAP) through [facility] for financial assistance -  Today patient reports that recent A1c 7.5% is "normal" for his sugar, and he is convinced it was never elevated and that it was due to machine error. CBGs: No CBG log today. No reported hypoglycemia Meds: Metformin 1000mg  BID Tolerating well w/o side-effects Currently on ACEi - Diet: not changed, he states he "can't afford health foods" Has changed  ophthalmology in past, but cannot follow-up with them for eye exam - Right Cataract but unable to afford surgery, not interested in charity care, states he "does not qualify" Denies hypoglycemia, numbness or tingling, polyuria  PROSTATE HEALTH - He reports that he has had DRE in past by me, however I do not have record of this exam. Also he states was done by previous PCP and was told "mildly enlarged" prostate. Last PSA 0.5, 02/2017 was normal. - He is asking why was he "not treated for enlarged prostate", however he does not endorse significant symptoms see score below  AUA BPH Symptom Score over past 1 month 1. Sensation of not emptying bladder post void - 0 2. Urinate less than 2 hour after finish last void - does not answer 3. Start/Stop several times during void - 0 4. Difficult to postpone urination - 0 5. Weak urinary stream - 1 6. Push or strain urination - 0 7. Nocturia - 1 times  Total Score: 2 (Mild BPH symptoms)  ----------------------------------  History of Depression vs Major Depression: - Reports prior history of depressed mood and suicidal ideation when spoke with LPN recently back in June 2018 for AMW. He endorses feeling "sadness when alone" and frustrated by other people. He has not been treated for major depression and is not interested. - He has not followed up with any mental health resources or walk in evaluations given to him previously -  Declines repeat PHQ score today - Denies any active suicidal ideation or plan  PMH - bilateral shoulder pain and limited ROM, history of rotator cuff tear - requesting refill naproxen  Health Maintenance: Colon CA Screening - Cologuard negative 02/2017 - next due 02/2020  [facility] 02/04/2017 11/14/2016 08/04/2015  Decreased Interest 2 0 0  Down, Depressed, Hopeless 3 0 0  PHQ - 2 Score 5 0 0  Altered sleeping 3 - -  Tired, decreased energy 2 - -  Change in appetite 2 - -  Feeling bad or failure about  yourself  3 - -  Trouble concentrating 2 - -  Moving slowly or fidgety/restless 0 - -  Suicidal thoughts 1 - -  PHQ-9 Score 18 - -  Difficult doing work/chores Somewhat difficult - -   [place]-Suicide Severity Rating Scale 1) Have you wished you were dead or wished you could go to sleep and not wake up? - Yes  2) Have you had any actual thoughts of killing yourself? - No  Skip questions 3,4, 5  6) Have you ever done anything, started to do anything, or prepared to do anything to end your life? - No   No flowsheet data found.   Past Medical History:  Diagnosis Date  . Arthritis   . Bulging lumbar disc    lower back  . Complication of anesthesia    kept moving while under anesthesia during colonoscopy, hard to put under.  . Diabetes mellitus without complication (HCC)   . GERD (gastroesophageal reflux disease)   . Hyperlipidemia   . Hypertension    Past Surgical History:  Procedure Laterality Date  . CIRCUMCISION N/A 07/24/2015   Procedure: CIRCUMCISION ADULT;  Surgeon: Hildred LaserBrian James Budzyn, MD;  Location: [facility] ORS;  Service: Urology;  Laterality: N/A;  . SHOULDER ARTHROSCOPY Bilateral   . SHOULDER ARTHROSCOPY Right 12/22/2014   Procedure: RIGHT SHOULDER ARTHROSCOPY /DECOMPRESSION/ROTATOR CUFF REPAIR OF RECURRENT ROTATOR CUFF TEAR;  Surgeon: Christena FlakeJohn J Poggi, MD;  Location: [facility] ORS;  Service: Orthopedics;  Laterality: Right;   Social History   Social History  . Marital status: Married    Spouse name: N/A  . Number of children: N/A  . Years of education: N/A   Occupational History  . Not on file.   Social History Main Topics  . Smoking status: Former Smoker    Packs/day: 1.50    Types: Cigarettes    Start date: 12/19/2001    Quit date: 07/03/2002  . Smokeless tobacco: Former NeurosurgeonUser     Comment: quit smoking in 06/2001  . Alcohol use No  . Drug use: No  . Sexual activity: Not on file   Other Topics Concern  . Not on file   Social History Narrative  . No narrative  on file   Family History  Problem Relation Age of Onset  . Alzheimer's disease Mother   . Diabetes Mother   . Hypertension Mother   . Cancer Father   . Heart failure Brother   . Diabetes Brother   . Hypertension Brother   . Heart disease Brother   . Prostate cancer Other        unknown  . Bladder Cancer Other        unknown  . Heart disease Son   . Diabetes Sister    Current Outpatient Prescriptions on File Prior to Visit  Medication Sig  . ACCU-CHEK SOFTCLIX LANCETS lancets 2 (two) times daily. for testing  . aspirin EC  81 MG tablet Take 1 tablet (81 mg total) by mouth daily.  Marland Kitchen. atorvastatin (LIPITOR) 40 MG tablet Take 1 tablet (40 mg total) by mouth every evening.  . baclofen (LIORESAL) 10 MG tablet Take 0.5-1 tablets (5-10 mg total) by mouth 3 (three) times daily as needed for muscle spasms.  . fluticasone (FLONASE) 50 MCG/ACT nasal spray Place 2 sprays into both nostrils daily. Use for 4-6 weeks then stop and use seasonally or as needed.  Marland Kitchen. glucose blood test strip Check blood sugar twice daily. Dx.E11.9  . Insulin Pen Needle (PEN NEEDLES) 32G X 4 MM MISC 1 Device by Does not apply route every 7 (seven) days.  Marland Kitchen. lisinopril (PRINIVIL,ZESTRIL) 40 MG tablet Take 1 tablet (40 mg total) by mouth daily.  . metFORMIN (GLUCOPHAGE) 1000 MG tablet Take 1 tablet (1,000 mg total) by mouth 2 (two) times daily with a meal.  . ONETOUCH DELICA LANCETS 33G MISC 1 each by Does not apply route 2 (two) times daily. Dx: E11.9  . pantoprazole (PROTONIX) 40 MG tablet Take 1 tablet (40 mg total) by mouth daily before breakfast.   No current facility-administered medications on file prior to visit.     Review of Systems  Constitutional: Negative for activity change, appetite change, chills, diaphoresis, fatigue and fever.  HENT: Negative for congestion, hearing loss and sinus pressure.   Eyes: Negative for visual disturbance.  Respiratory: Negative for cough, chest tightness, shortness of breath  and wheezing.   Cardiovascular: Negative for chest pain, palpitations and leg swelling.  Gastrointestinal: Negative for abdominal pain, anal bleeding, blood in stool, constipation, diarrhea, nausea and vomiting.  Endocrine: Negative for cold intolerance and polyuria.  Genitourinary: Negative for decreased urine volume, difficulty urinating, dysuria, frequency, hematuria and testicular pain.  Musculoskeletal: Positive for arthralgias (L>R shoulder pain). Negative for back pain and neck pain.  Skin: Negative for rash.  Allergic/Immunologic: Negative for environmental allergies.  Neurological: Negative for dizziness, weakness, light-headedness, numbness and headaches.  Hematological: Negative for adenopathy.  Psychiatric/Behavioral: Positive for dysphoric mood (intermittent episodes, currently stable). Negative for behavioral problems, self-injury, sleep disturbance and suicidal ideas. The patient is not nervous/anxious.    Per HPI unless specifically indicated above     Objective:    BP (!) 160/80   Pulse 88   Temp 98.6 F (37 C) (Oral)   Resp 16   Ht 5\' 11"  (1.803 m)   Wt 239 lb (108.4 kg)   BMI 33.33 kg/m   Wt Readings from Last 3 Encounters:  03/19/17 239 lb (108.4 kg)  03/03/17 242 lb 12.8 oz (110.1 kg)  02/04/17 239 lb (108.4 kg)    Physical Exam  Constitutional: He is oriented to person, place, and time. He appears well-developed and well-nourished. No distress.  Well-appearing, comfortable, cooperative  HENT:  Head: Normocephalic and atraumatic.  Mouth/Throat: Oropharynx is clear and moist.  Eyes: Pupils are equal, round, and reactive to light. Conjunctivae and EOM are normal. Right eye exhibits no discharge. Left eye exhibits no discharge.  Neck: Normal range of motion. Neck supple. No thyromegaly present.  Cardiovascular: Normal rate, regular rhythm, normal heart sounds and intact distal pulses.   No murmur heard. Pulmonary/Chest: Effort normal and breath sounds  normal. No respiratory distress. He has no wheezes. He has no rales.  Abdominal: Soft. Bowel sounds are normal. He exhibits no distension and no mass. There is no tenderness.  Genitourinary:  Genitourinary Comments: Deferred repeat DRE  Musculoskeletal: He exhibits no edema.  Upper / Lower  Extremities: - Normal muscle tone, strength bilateral upper extremities 5/5, lower extremities 5/5  Bilateral Shoulders Reduced range of motion above shoulder with some discomfort, history of rotator cuff tear, no change since last visit.  Lymphadenopathy:    He has no cervical adenopathy.  Neurological: He is alert and oriented to person, place, and time.  Distal sensation intact to light touch all extremities  Skin: Skin is warm and dry. No rash noted. He is not diaphoretic. No erythema.  Psychiatric: He has a normal mood and affect. His behavior is normal.  Well groomed, good eye contact, normal speech and thoughts  Nursing note and vitals reviewed.    Diabetic Foot Exam - Simple   Simple Foot Form Diabetic Foot exam was performed with the following findings:  Yes 03/19/2017  9:30 AM  Visual Inspection No deformities, no ulcerations, no other skin breakdown bilaterally:  Yes Sensation Testing Intact to touch and monofilament testing bilaterally:  Yes Pulse Check Posterior Tibialis and Dorsalis pulse intact bilaterally:  Yes Comments        Chemistry      Component Value Date/Time   NA 138 02/25/2017 0812   NA 141 07/05/2015 0832   NA 140 06/08/2014 0854   K 4.6 02/25/2017 0812   K 4.2 06/08/2014 0854   CL 103 02/25/2017 0812   CL 104 06/08/2014 0854   CO2 23 02/25/2017 0812   CO2 29 06/08/2014 0854   BUN 15 02/25/2017 0812   BUN 16 07/05/2015 0832   BUN 16 06/08/2014 0854   CREATININE 1.10 02/25/2017 0812      Component Value Date/Time   CALCIUM 9.8 02/25/2017 0812   CALCIUM 9.1 06/08/2014 0854   ALKPHOS 76 02/25/2017 0812   ALKPHOS 86 12/29/2012 2143   AST 13 02/25/2017  0812   AST 25 12/29/2012 2143   ALT 15 02/25/2017 0812   ALT 38 12/29/2012 2143   BILITOT 0.9 02/25/2017 0812   BILITOT 0.9 07/05/2015 0832   BILITOT 0.5 12/29/2012 2143      Recent Labs  04/11/16 1522 02/25/17 0812  HGBA1C 14.0% 7.5*    Lipid Panel     Component Value Date/Time   CHOL 137 02/25/2017 0812   CHOL 189 07/05/2015 0832   TRIG 111 02/25/2017 0812   HDL 32 (L) 02/25/2017 0812   HDL 30 (L) 07/05/2015 0832   CHOLHDL 4.3 02/25/2017 0812   VLDL 22 02/25/2017 0812   LDLCALC 83 02/25/2017 0812   LDLCALC 126 (H) 07/05/2015 0832     Results for orders placed or performed in visit on 03/05/17  Cologuard  Result Value Ref Range   Cologuard Negative       Assessment & Plan:   Problem List Items Addressed This Visit    Major depression    Stable chronic mood disorder, seems related to combination of factors with financial stress and other life stressors, also with health concerns. - Elevated PHQ, declines repeat - Prior passive suicidal ideation without plan or prior attempts, given mental health resources but never followed up - Patient declines further discussion today, will continue review with patient as needed, and consider SSRI      Hypertension    Elevated BP Declines adjust BP med today, states that BP is normal at home, but elevated here      Diabetes mellitus type 2, controlled, without complications (HCC) - Primary    Dramatically improved A1c now 7.5 (previously >14 in 03/2016), uncertain of accuracy of results, since he  has been on less medicines over past >6 months, unable to afford Victoza, and not improved lifestyle/diet. Possible inaccurate reading A1c >14 in 03/2016, however home CBG seems to support his reading. - Lost to follow-up for >6 months despite recommendations - Poor health literacy and decreased interest in his own care Complication with R cataracts  Plan:  1. Continue current therapy - Metformin 1000mg  BID 2. Remains off Victoza  - no update on if other GLP1 more affordable. He has high deductible and declines other offers of other med options, due to cost 3. 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 No CBG log today 5. Encourage improved lifestyle - low carb, low sugar diet, reduce portion size, continue improving regular exercise 6. Continue ASA, ACEi, Statin 7. Due for DM eye - needs to f/u with ophtho also with cataracts, admits limited financial barrier 8. Follow-up q 6 months for DM A1c - declines 3 month follow-up  Additionally - in future if cost is limiting his care, then again will advise financial resources already given to patient, such as [facility] Assistance Program through [facility], which should include medicare donut hole, also [facility] (he used to go there), possible [facility] for charity care options, and research options with free meds or samples is possibility - however he declines due to the estate partially in his name currently      Degenerative joint disease (DJD) of lumbar spine   Relevant Medications   naproxen (NAPROSYN) 500 MG tablet   Complete rotator cuff rupture of left shoulder    Chronic problem, without worsening Limiting function Not interested in referral or other management Refill Naproxen      BPH without obstruction/lower urinary tract symptoms    Stable chronic BPH based on prior reported DRE without symptoms of LUTS - AUA BPH score 2 (mild) - never on medication - Last PSA 0.5 (02/2017) - Last DRE reported mild enlarged prostate, do not have record of last exam - No known personal/family history of prostate CA  Plan: 1. Agree for yearly PSA monitoring, if worsening symptoms follow-up consider alpha blocker trial, defer repeat DRE by patient request, concern if elevated PSA in future, has seen Urology for phimosis and circ but not prostate         Meds ordered this encounter  Medications  . naproxen (NAPROSYN)  500 MG tablet    Sig: Take 1 tablet (500 mg total) by mouth 2 (two) times daily with a meal. For 2-4 weeks then as needed    Dispense:  60 tablet    Refill:  2      Follow up plan: Return in about 6 months (around 09/19/2017) for diabetes, blood pressure.  Patient declines q 3 month follow-up for Diabetes, and requests q 6 month  Saralyn Pilar, DO [facility] 03/19/2017, 5:59 PM

## 2017-03-19 NOTE — Assessment & Plan Note (Signed)
Chronic problem, without worsening Limiting function Not interested in referral or other management Refill Naproxen

## 2017-03-19 NOTE — Patient Instructions (Addendum)
Thank you for coming to the clinic today.  1. Good work, keep up it up 2. A1c much improved 3. PSA test is normal 4. Prostate symptoms are minimal, try to stay well hydrated as you are 5. Refilled Naproxen as needed for shoulder pain  Please schedule a Follow-up Appointment to: Return in about 6 months (around 09/19/2017) for diabetes, blood pressure.  If you have any other questions or concerns, please feel free to call the clinic or send a message through MyChart. You may also schedule an earlier appointment if necessary.  Additionally, you may be receiving a survey about your experience at our clinic within a few days to 1 week by e-mail or mail. We value your feedback.  Saralyn PilarAlexander Karamalegos, DO [facility]

## 2017-03-19 NOTE — Assessment & Plan Note (Signed)
Stable chronic mood disorder, seems related to combination of factors with financial stress and other life stressors, also with health concerns. - Elevated PHQ, declines repeat - Prior passive suicidal ideation without plan or prior attempts, given mental health resources but never followed up - Patient declines further discussion today, will continue review with patient as needed, and consider SSRI

## 2017-03-19 NOTE — Assessment & Plan Note (Signed)
Stable chronic BPH based on prior reported DRE without symptoms of LUTS - AUA BPH score 2 (mild) - never on medication - Last PSA 0.5 (02/2017) - Last DRE reported mild enlarged prostate, do not have record of last exam - No known personal/family history of prostate CA  Plan: 1. Agree for yearly PSA monitoring, if worsening symptoms follow-up consider alpha blocker trial, defer repeat DRE by patient request, concern if elevated PSA in future, has seen Urology for phimosis and circ but not prostate

## 2017-06-24 ENCOUNTER — Other Ambulatory Visit: Payer: Self-pay

## 2017-06-24 MED ORDER — CLOTRIMAZOLE-BETAMETHASONE 1-0.05 % EX CREA: 1.0000 "application " | TOPICAL_CREAM | Freq: Two times a day (BID) | CUTANEOUS | 0 refills | Status: DC

## 2017-08-13 ENCOUNTER — Inpatient Hospital Stay
Admission: EM | Admit: 2017-08-13 | Discharge: 2017-08-14 | DRG: 065 | Disposition: A | Discharge status: Home / Self Care | Payer: MEDICARE | Attending: Internal Medicine | Admitting: Internal Medicine

## 2017-08-13 ENCOUNTER — Other Ambulatory Visit: Payer: Self-pay

## 2017-08-13 ENCOUNTER — Emergency Department: Payer: MEDICARE

## 2017-08-13 ENCOUNTER — Inpatient Hospital Stay: Payer: MEDICARE

## 2017-08-13 ENCOUNTER — Telehealth: Payer: Self-pay | Admitting: Nurse Practitioner

## 2017-08-13 ENCOUNTER — Inpatient Hospital Stay (HOSPITAL_COMMUNITY)
Admit: 2017-08-13 | Discharge: 2017-08-13 | Disposition: A | Discharge status: Home / Self Care | Payer: MEDICARE | Attending: Internal Medicine | Admitting: Internal Medicine

## 2017-08-13 DIAGNOSIS — E1169 Type 2 diabetes mellitus with other specified complication: Secondary | ICD-10-CM

## 2017-08-13 DIAGNOSIS — E785 Hyperlipidemia, unspecified: Secondary | ICD-10-CM | POA: Diagnosis present

## 2017-08-13 DIAGNOSIS — Z7984 Long term (current) use of oral hypoglycemic drugs: Secondary | ICD-10-CM | POA: Diagnosis not present

## 2017-08-13 DIAGNOSIS — R29818 Other symptoms and signs involving the nervous system: Secondary | ICD-10-CM | POA: Diagnosis not present

## 2017-08-13 DIAGNOSIS — K219 Gastro-esophageal reflux disease without esophagitis: Secondary | ICD-10-CM | POA: Diagnosis not present

## 2017-08-13 DIAGNOSIS — Z87891 Personal history of nicotine dependence: Secondary | ICD-10-CM | POA: Diagnosis not present

## 2017-08-13 DIAGNOSIS — R4701 Aphasia: Secondary | ICD-10-CM | POA: Diagnosis present

## 2017-08-13 DIAGNOSIS — Z7982 Long term (current) use of aspirin: Secondary | ICD-10-CM | POA: Diagnosis not present

## 2017-08-13 DIAGNOSIS — I5030 Unspecified diastolic (congestive) heart failure: Secondary | ICD-10-CM | POA: Diagnosis not present

## 2017-08-13 DIAGNOSIS — E1136 Type 2 diabetes mellitus with diabetic cataract: Secondary | ICD-10-CM | POA: Diagnosis present

## 2017-08-13 DIAGNOSIS — R29700 NIHSS score 0: Secondary | ICD-10-CM | POA: Diagnosis not present

## 2017-08-13 DIAGNOSIS — I10 Essential (primary) hypertension: Secondary | ICD-10-CM | POA: Diagnosis not present

## 2017-08-13 DIAGNOSIS — G8191 Hemiplegia, unspecified affecting right dominant side: Secondary | ICD-10-CM | POA: Diagnosis present

## 2017-08-13 DIAGNOSIS — I6381 Other cerebral infarction due to occlusion or stenosis of small artery: Secondary | ICD-10-CM | POA: Diagnosis present

## 2017-08-13 DIAGNOSIS — Z8673 Personal history of transient ischemic attack (TIA), and cerebral infarction without residual deficits: Secondary | ICD-10-CM | POA: Diagnosis present

## 2017-08-13 DIAGNOSIS — Z79899 Other long term (current) drug therapy: Secondary | ICD-10-CM | POA: Diagnosis not present

## 2017-08-13 DIAGNOSIS — Z791 Long term (current) use of non-steroidal anti-inflammatories (NSAID): Secondary | ICD-10-CM

## 2017-08-13 DIAGNOSIS — I639 Cerebral infarction, unspecified: Secondary | ICD-10-CM | POA: Diagnosis not present

## 2017-08-13 DIAGNOSIS — E119 Type 2 diabetes mellitus without complications: Secondary | ICD-10-CM | POA: Diagnosis not present

## 2017-08-13 DIAGNOSIS — R531 Weakness: Secondary | ICD-10-CM | POA: Diagnosis not present

## 2017-08-13 DIAGNOSIS — R4781 Slurred speech: Secondary | ICD-10-CM | POA: Diagnosis not present

## 2017-08-13 LAB — HEMOGLOBIN A1C
Hgb A1c MFr Bld: 7.7 % — ABNORMAL HIGH (ref 4.8–5.6)
Mean Plasma Glucose: 174.29 mg/dL

## 2017-08-13 LAB — GLUCOSE, CAPILLARY
Glucose-Capillary: 125 mg/dL — ABNORMAL HIGH (ref 65–99)
Glucose-Capillary: 151 mg/dL — ABNORMAL HIGH (ref 65–99)

## 2017-08-13 LAB — CBC
HCT: 47.1 % (ref 40.0–52.0)
Hemoglobin: 15.5 g/dL (ref 13.0–18.0)
MCH: 29.1 pg (ref 26.0–34.0)
MCHC: 32.9 g/dL (ref 32.0–36.0)
MCV: 88.3 fL (ref 80.0–100.0)
Platelets: 252 10*3/uL (ref 150–440)
RBC: 5.33 MIL/uL (ref 4.40–5.90)
RDW: 12.9 % (ref 11.5–14.5)
WBC: 9 10*3/uL (ref 3.8–10.6)

## 2017-08-13 LAB — COMPREHENSIVE METABOLIC PANEL
ALT: 17 U/L (ref 17–63)
AST: 19 U/L (ref 15–41)
Albumin: 3.8 g/dL (ref 3.5–5.0)
Alkaline Phosphatase: 62 U/L (ref 38–126)
Anion gap: 8 (ref 5–15)
BUN: 28 mg/dL — ABNORMAL HIGH (ref 6–20)
CO2: 25 mmol/L (ref 22–32)
Calcium: 9.5 mg/dL (ref 8.9–10.3)
Chloride: 102 mmol/L (ref 101–111)
Creatinine, Ser: 1.26 mg/dL — ABNORMAL HIGH (ref 0.61–1.24)
GFR calc Af Amer: >60 mL/min (ref >60)
GFR calc non Af Amer: 57 mL/min — ABNORMAL LOW (ref >60)
Glucose, Bld: 208 mg/dL — ABNORMAL HIGH (ref 65–99)
Potassium: 4 mmol/L (ref 3.5–5.1)
Sodium: 135 mmol/L (ref 135–145)
Total Bilirubin: 1.1 mg/dL (ref 0.3–1.2)
Total Protein: 7.7 g/dL (ref 6.5–8.1)

## 2017-08-13 LAB — DIFFERENTIAL
Basophils Absolute: 0.1 10*3/uL (ref 0–0.1)
Basophils Relative: 1 %
Eosinophils Absolute: 0.4 10*3/uL (ref 0–0.7)
Eosinophils Relative: 4 %
Lymphocytes Relative: 28 %
Lymphs Abs: 2.5 10*3/uL (ref 1.0–3.6)
Monocytes Absolute: 0.9 10*3/uL (ref 0.2–1.0)
Monocytes Relative: 10 %
Neutro Abs: 5.2 10*3/uL (ref 1.4–6.5)
Neutrophils Relative %: 57 %

## 2017-08-13 LAB — TROPONIN I: Troponin I: <0.03 ng/mL (ref <0.03)

## 2017-08-13 LAB — LIPID PANEL
Cholesterol: 170 mg/dL (ref 0–200)
HDL: 28 mg/dL — ABNORMAL LOW (ref >40)
LDL Cholesterol: 119 mg/dL — ABNORMAL HIGH (ref 0–99)
Total CHOL/HDL Ratio: 6.1 RATIO
Triglycerides: 115 mg/dL (ref <150)
VLDL: 23 mg/dL (ref 0–40)

## 2017-08-13 LAB — PROTIME-INR
INR: 0.93
Prothrombin Time: 12.4 seconds (ref 11.4–15.2)

## 2017-08-13 LAB — APTT: aPTT: 28 seconds (ref 24–36)

## 2017-08-13 MED ORDER — ATORVASTATIN CALCIUM 20 MG PO TABS: 40.0000 mg | ORAL_TABLET | Freq: Every evening | ORAL | Status: DC

## 2017-08-13 MED ORDER — CLOPIDOGREL BISULFATE 75 MG PO TABS
75.0000 mg | ORAL_TABLET | Freq: Every day | ORAL | Status: DC
  Administered 2017-08-13 – 2017-08-14 (×2): 75 mg via ORAL
  Filled 2017-08-13 (×2): qty 1

## 2017-08-13 MED ORDER — ENOXAPARIN SODIUM 40 MG/0.4ML ~~LOC~~ SOLN
40.0000 mg | SUBCUTANEOUS | Status: DC
  Filled 2017-08-13: qty 0.4

## 2017-08-13 MED ORDER — LISINOPRIL 20 MG PO TABS: 40.0000 mg | ORAL_TABLET | Freq: Every day | ORAL | Status: DC

## 2017-08-13 MED ORDER — ACETAMINOPHEN 160 MG/5ML PO SOLN
650.0000 mg | ORAL | Status: DC | PRN
  Filled 2017-08-13: qty 20.3

## 2017-08-13 MED ORDER — ACETAMINOPHEN 650 MG RE SUPP: 650.0000 mg | RECTAL | Status: DC | PRN

## 2017-08-13 MED ORDER — ACETAMINOPHEN 325 MG PO TABS: 650.0000 mg | ORAL_TABLET | ORAL | Status: DC | PRN

## 2017-08-13 MED ORDER — PANTOPRAZOLE SODIUM 40 MG PO TBEC: 40.0000 mg | DELAYED_RELEASE_TABLET | Freq: Every day | ORAL | Status: DC

## 2017-08-13 MED ORDER — BACLOFEN 5 MG HALF TABLET
5.0000 mg | ORAL_TABLET | Freq: Three times a day (TID) | ORAL | Status: DC | PRN
  Filled 2017-08-13: qty 2

## 2017-08-13 MED ORDER — INSULIN ASPART 100 UNIT/ML ~~LOC~~ SOLN: 0.0000 [IU] | Freq: Three times a day (TID) | SUBCUTANEOUS | Status: DC

## 2017-08-13 MED ORDER — STROKE: EARLY STAGES OF RECOVERY BOOK
Freq: Once | Status: AC
  Administered 2017-08-13: 17:00:00

## 2017-08-13 MED ORDER — INSULIN ASPART 100 UNIT/ML ~~LOC~~ SOLN: 0.0000 [IU] | Freq: Every day | SUBCUTANEOUS | Status: DC

## 2017-08-13 MED ORDER — ASPIRIN EC 81 MG PO TBEC
81.0000 mg | DELAYED_RELEASE_TABLET | Freq: Every day | ORAL | Status: DC
  Administered 2017-08-14: 11:00:00 81 mg via ORAL
  Filled 2017-08-13: qty 1

## 2017-08-13 NOTE — H&P (Signed)
[employer] - [place] at [facility]   PATIENT NAME: Bruce Richardson    MR#:  [id number]  DATE OF BIRTH:  08/04/1951  DATE OF ADMISSION:  08/13/2017  PRIMARY CARE PHYSICIAN: Smitty Cords, DO   REQUESTING/REFERRING PHYSICIAN: dr Lenard Lance  CHIEF COMPLAINT:  Difficulty with speech  HISTORY OF PRESENT ILLNESS:  Bruce Richardson  is a 66 y.o. male with a known history of diabetes and essential hypertension who presents with above complaint. Patient reports and Sundays had difficulty with speech. He denies any other neurological deficits. 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 This is not anything new. In the emergency room MRI did confirm a sub-acute/acute CVA. He continues to have some speech difficulty.  PAST MEDICAL HISTORY:   Past Medical History:  Diagnosis Date  . Arthritis   . Bulging lumbar disc    lower back  . Complication of anesthesia    kept moving while under anesthesia during colonoscopy, hard to put under.  . Diabetes mellitus without complication (HCC)   . GERD (gastroesophageal reflux disease)   . Hyperlipidemia   . Hypertension     PAST SURGICAL HISTORY:   Past Surgical History:  Procedure Laterality Date  . CIRCUMCISION N/A 07/24/2015   Procedure: CIRCUMCISION ADULT;  Surgeon: Hildred Laser, MD;  Location: [facility] ORS;  Service: Urology;  Laterality: N/A;  . SHOULDER ARTHROSCOPY Bilateral   . SHOULDER ARTHROSCOPY Right 12/22/2014   Procedure: RIGHT SHOULDER ARTHROSCOPY /DECOMPRESSION/ROTATOR CUFF REPAIR OF RECURRENT ROTATOR CUFF TEAR;  Surgeon: Christena Flake, MD;  Location: [facility] ORS;  Service: Orthopedics;  Laterality: Right;    SOCIAL HISTORY:   Social History   Tobacco Use  . Smoking status: Former Smoker    Packs/day: 1.50    Types: Cigarettes    Start date: 12/19/2001    Last attempt to quit: 07/03/2002    Years since quitting: 15.1  . Smokeless tobacco: Former Neurosurgeon  .  Tobacco comment: quit smoking in 06/2001  Substance Use Topics  . Alcohol use: No    Alcohol/week: 0.0 oz    FAMILY HISTORY:   Family History  Problem Relation Age of Onset  . Alzheimer's disease Mother   . Diabetes Mother   . Hypertension Mother   . Cancer Father   . Heart failure Brother   . Diabetes Brother   . Hypertension Brother   . Heart disease Brother   . Prostate cancer Other        unknown  . Bladder Cancer Other        unknown  . Heart disease Son   . Diabetes Sister     DRUG ALLERGIES:  No Known Allergies  REVIEW OF SYSTEMS:   Review of Systems  Constitutional: Negative.  Negative for chills, fever and malaise/fatigue.  HENT: Negative.  Negative for ear discharge, ear pain, hearing loss, nosebleeds and sore throat.   Eyes: Negative.  Negative for blurred vision and pain.  Respiratory: Negative.  Negative for cough, hemoptysis, shortness of breath and wheezing.   Cardiovascular: Negative.  Negative for chest pain, palpitations and leg swelling.  Gastrointestinal: Negative.  Negative for abdominal pain, blood in stool, diarrhea, nausea and vomiting.  Genitourinary: Negative.  Negative for dysuria.  Musculoskeletal: Negative.  Negative for back pain.  Skin: Negative.   Neurological: Positive for speech change. Negative for dizziness, tremors, focal weakness, seizures and headaches.  Endo/Heme/Allergies: Negative.  Does not bruise/bleed easily.  Psychiatric/Behavioral: Negative.  Negative for depression, hallucinations and suicidal ideas.    MEDICATIONS AT HOME:   Prior to Admission medications   Medication Sig Start Date End Date Taking? Authorizing Provider  ACCU-CHEK SOFTCLIX LANCETS lancets 2 (two) times daily. for testing 04/21/15   [provider]  aspirin EC 81 MG tablet Take 1 tablet (81 mg total) by mouth daily. 01/29/17   Karamalegos, Netta Neat, DO  atorvastatin (LIPITOR) 40 MG tablet Take 1 tablet (40 mg total) by mouth every evening.  01/28/17   Karamalegos, Netta Neat, DO  baclofen (LIORESAL) 10 MG tablet Take 0.5-1 tablets (5-10 mg total) by mouth 3 (three) times daily as needed for muscle spasms. 11/14/16   Karamalegos, Netta Neat, DO  clotrimazole-betamethasone (LOTRISONE) cream Apply 1 application 2 (two) times daily topically. For 2 weeks then stop 06/24/17   Karamalegos, Alexander J, DO  fluticasone (FLONASE) 50 MCG/ACT nasal spray Place 2 sprays into both nostrils daily. Use for 4-6 weeks then stop and use seasonally or as needed. 01/28/17   Karamalegos, Netta Neat, DO  glucose blood test strip Check blood sugar twice daily. Dx.E11.9 11/26/16   Karamalegos, Netta Neat, DO  Insulin Pen Needle (PEN NEEDLES) 32G X 4 MM MISC 1 Device by Does not apply route every 7 (seven) days. 07/10/15   Ellyn Hack, MD  lisinopril (PRINIVIL,ZESTRIL) 40 MG tablet Take 1 tablet (40 mg total) by mouth daily. 01/28/17   Karamalegos, Netta Neat, DO  metFORMIN (GLUCOPHAGE) 1000 MG tablet Take 1 tablet (1,000 mg total) by mouth 2 (two) times daily with a meal. 01/28/17   Karamalegos, Netta Neat, DO  naproxen (NAPROSYN) 500 MG tablet Take 1 tablet (500 mg total) by mouth 2 (two) times daily with a meal. For 2-4 weeks then as needed 03/19/17   Karamalegos, Netta Neat, DO  ONETOUCH DELICA LANCETS 33G MISC 1 each by Does not apply route 2 (two) times daily. Dx: E11.9 04/30/16   Smitty Cords, DO  pantoprazole (PROTONIX) 40 MG tablet Take 1 tablet (40 mg total) by mouth daily before breakfast. 01/28/17   Karamalegos, Netta Neat, DO      VITAL SIGNS:  Blood pressure (!) 142/72, pulse 93, temperature 99.1 F (37.3 C), temperature source Oral, resp. rate 14, height 5\' 10"  (1.778 m), weight 106.1 kg (234 lb), SpO2 96 %.  PHYSICAL EXAMINATION:   Physical Exam  Constitutional: He is oriented to person, place, and time and well-developed, well-nourished, and in no distress. No distress.  HENT:  Head: Normocephalic.  Eyes: No scleral  icterus.  Neck: Normal range of motion. Neck supple. No JVD present. No tracheal deviation present.  Cardiovascular: Normal rate, regular rhythm and normal heart sounds. Exam reveals no gallop and no friction rub.  No murmur heard. Pulmonary/Chest: Effort normal and breath sounds normal. No respiratory distress. He has no wheezes. He has no rales. He exhibits no tenderness.  Abdominal: Soft. Bowel sounds are normal. He exhibits no distension and no mass. There is no tenderness. There is no rebound and no guarding.  Musculoskeletal: Normal range of motion. He exhibits no edema.  Neurological: He is alert and oriented to person, place, and time.  aphasia  Skin: Skin is warm. No rash noted. No erythema.  Psychiatric: Affect and judgment normal.      LABORATORY PANEL:   CBC Recent Labs  Lab 08/13/17 0953  WBC 9.0  HGB 15.5  HCT 47.1  PLT 252   ------------------------------------------------------------------------------------------------------------------  Chemistries  Recent Labs  Lab 08/13/17 0953  NA 135  K 4.0  CL 102  CO2 25  GLUCOSE 208*  BUN 28*  CREATININE 1.26*  CALCIUM 9.5  AST 19  ALT 17  ALKPHOS 62  BILITOT 1.1   ------------------------------------------------------------------------------------------------------------------  Cardiac Enzymes Recent Labs  Lab 08/13/17 0953  TROPONINI <0.03   ------------------------------------------------------------------------------------------------------------------  RADIOLOGY:  Ct Head Wo Contrast  Result Date: 08/13/2017 CLINICAL DATA:  Slurred speech per family member, funny feeling to the back of the RIGHT-side of his neck, focal neural deficit of greater than 6 hours suspected stroke, history hypertension and diabetes mellitus EXAM: CT HEAD WITHOUT CONTRAST TECHNIQUE: Contiguous axial images were obtained from the base of the skull through the vertex without intravenous contrast. Sagittal and coronal MPR  images reconstructed from axial data set. COMPARISON:  None. FINDINGS: Brain: Mild generalized atrophy. Normal ventricular morphology. No midline shift or mass effect. Small vessel chronic ischemic changes of deep cerebral white matter. Low-attenuation at anterior LEFT temporal lobe question subtle infarct versus beam hardening artifact. Suspected small deep LEFT pontine infarct. No additional areas of infarction. No intracranial hemorrhage, mass lesion or extra-axial fluid collection. Vascular: Atherosclerotic calcification of internal carotid arteries bilaterally at skullbase Skull: Intact Sinuses/Orbits: Clear Other: N/A IMPRESSION: Atrophy with small vessel chronic ischemic changes of deep cerebral white matter. Probable small LEFT pontine infarct. Low-attenuation and anterior LEFT temporal lobe either representing subtle infarct or beam hardening artifact. Electronically Signed   By: Ulyses SouthwardMark  Boles M.D.   On: 08/13/2017 10:37   Mr Brain Wo Contrast  Result Date: 08/13/2017 CLINICAL DATA:  66 year old male with slurred speech and abnormal sensation along the right neck. Symptoms for 3 days. EXAM: MRI HEAD WITHOUT CONTRAST TECHNIQUE: Multiplanar, multiecho pulse sequences of the brain and surrounding structures were obtained without intravenous contrast. COMPARISON:  Head CT without contrast 1015 hours today. FINDINGS: Brain: Confluent 17 mm area of restricted diffusion in the left paracentral pons (series 7, image 17) with associated T2 and FLAIR hyperintensity, and T1 hypointensity. No associated hemorrhage or mass effect. This corresponds to the left pons CT hypodensity today. No other restricted diffusion. No midline shift, mass effect, evidence of mass lesion, ventriculomegaly, extra-axial collection or acute intracranial hemorrhage. Cervicomedullary junction and pituitary are within normal limits. Patchy in scattered bilateral cerebral white matter T2 and FLAIR hyperintensity, moderately advanced for age.  Mild involvement of the temporal lobes, including the anterior temporal subcortical white matter which corresponds to some of the CT hypodensity there today. No cortical encephalomalacia or chronic cerebral blood products identified. Deep gray matter nuclei and brainstem are normal. Vascular: Major intracranial vascular flow voids are preserved. Mild intracranial artery tortuosity. Skull and upper cervical spine: Negative visualized cervical spine. Normal bone marrow signal. Sinuses/Orbits: Normal orbits soft tissues. Paranasal sinuses are clear. Other: Visible internal auditory structures appear normal. Mastoid air cells are clear. Negative scalp and face soft tissues. IMPRESSION: 1. Acute to subacute lacunar type infarct in the left pons. No associated hemorrhage or mass effect. 2. Moderately advanced nonspecific cerebral white matter signal changes in both hemispheres, most commonly due to chronic small vessel disease. Electronically Signed   By: Odessa FlemingH  Hall M.D.   On: 08/13/2017 12:28    EKG:   Sinus tachycardia heart rate 103 no ST elevation or depression  IMPRESSION AND PLAN:   66 year old male with history of diabetes and essential hypertension who presents with difficulty with speech since Sunday.  1. 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 Diabetes: Continue ADA diet with sliding scale Hold metformin  3. Essential hypertension: Patient will resume outpatient hypertensive medications including lisinopril, given stroke symptoms started on Tuesday  4. GERD: Continue PPI  All the records are reviewed and case discussed with ED provider. Management plans discussed with the patient and he is in agreement  CODE STATUS: Full  TOTAL TIME TAKING CARE OF THIS PATIENT:  40 minutes.    Bruce Richardson M.D on 08/13/2017 at 1:03 PM  Between 7am to 6pm - Pager - [phone]  After 6pm go to [email or web address] - Social research officer, [email or web address] [email or web address]  [facility]  Office  [phone]  CC: Primary care physician; Smitty CordsKaramalegos, Alexander J, DO

## 2017-08-13 NOTE — ED Notes (Signed)
Patient transported to CT 

## 2017-08-13 NOTE — ED Notes (Signed)
Report to Malka, RN.  

## 2017-08-13 NOTE — ED Provider Notes (Signed)
[facility] Emergency Department Provider Note  Time seen: 9:46 AM  I have reviewed the triage vital signs and the nursing notes.   HISTORY  Chief Complaint Aphasia and Weakness    HPI Bruce Richardson is a 66 y.o. male with a past medical history of arthritis, diabetes, gastric reflux, hypertension, hyperlipidemia who presents to the emergency department for right-sided weakness.  According to the patient that 3 days ago he awoke with slurred speech and twisting of the right side of his face per patient.  States he thinks the face Better and the speech got better but he continues to have weakness of the right arm, states he is having trouble controlling it.  Patient denies any prior CVA.  Denies any other symptoms on review of systems besides a mild discomfort to the back of his right head.  But denies headache.   Past Medical History:  Diagnosis Date  . Arthritis   . Bulging lumbar disc    lower back  . Complication of anesthesia    kept moving while under anesthesia during colonoscopy, hard to put under.  . Diabetes mellitus without complication (HCC)   . GERD (gastroesophageal reflux disease)   . Hyperlipidemia   . Hypertension     Patient Active Problem List   Diagnosis Date Noted  . Major depression 03/19/2017  . BPH without obstruction/lower urinary tract symptoms 03/19/2017  . Chronic bilateral low back pain 11/14/2016  . 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 GERD (gastroesophageal reflux disease) 04/11/2016  . Obesity 04/11/2016  . Allergic rhinitis 08/04/2015  . Hypertension 07/04/2015  . Hyperlipidemia due to type 2 diabetes mellitus (HCC) 07/04/2015  . Diabetes mellitus type 2, controlled, without complications (HCC) 07/04/2015  . Non-retracting foreskin 07/04/2015  . Tendinitis of elbow or forearm 04/26/2015  . Injury of tendon of upper extremity 12/23/2014  . Complete rotator cuff rupture of left shoulder  09/23/2014  . H/O repair of rotator cuff 08/31/2014  . History of repair of rotator cuff 08/31/2014    Past Surgical History:  Procedure Laterality Date  . CIRCUMCISION N/A 07/24/2015   Procedure: CIRCUMCISION ADULT;  Surgeon: Hildred LaserBrian James Budzyn, MD;  Location: [facility] ORS;  Service: Urology;  Laterality: N/A;  . SHOULDER ARTHROSCOPY Bilateral   . SHOULDER ARTHROSCOPY Right 12/22/2014   Procedure: RIGHT SHOULDER ARTHROSCOPY /DECOMPRESSION/ROTATOR CUFF REPAIR OF RECURRENT ROTATOR CUFF TEAR;  Surgeon: Christena FlakeJohn J Poggi, MD;  Location: [facility] ORS;  Service: Orthopedics;  Laterality: Right;    Prior to Admission medications   Medication Sig Start Date End Date Taking? Authorizing Provider  ACCU-CHEK SOFTCLIX LANCETS lancets 2 (two) times daily. for testing 04/21/15   [provider]  aspirin EC 81 MG tablet Take 1 tablet (81 mg total) by mouth daily. 01/29/17   Karamalegos, Netta NeatAlexander J, DO  atorvastatin (LIPITOR) 40 MG tablet Take 1 tablet (40 mg total) by mouth every evening. 01/28/17   Karamalegos, Netta NeatAlexander J, DO  baclofen (LIORESAL) 10 MG tablet Take 0.5-1 tablets (5-10 mg total) by mouth 3 (three) times daily as needed for muscle spasms. 11/14/16   Karamalegos, Netta NeatAlexander J, DO  clotrimazole-betamethasone (LOTRISONE) cream Apply 1 application 2 (two) times daily topically. For 2 weeks then stop 06/24/17   Karamalegos, Alexander J, DO  fluticasone (FLONASE) 50 MCG/ACT nasal spray Place 2 sprays into both nostrils daily. Use for 4-6 weeks then stop and use seasonally or as needed. 01/28/17   Karamalegos, Netta NeatAlexander J, DO  glucose blood test  strip Check blood sugar twice daily. Dx.E11.9 11/26/16   Karamalegos, Netta Neat, DO  Insulin Pen Needle (PEN NEEDLES) 32G X 4 MM MISC 1 Device by Does not apply route every 7 (seven) days. 07/10/15   Ellyn Hack, MD  lisinopril (PRINIVIL,ZESTRIL) 40 MG tablet Take 1 tablet (40 mg total) by mouth daily. 01/28/17   Karamalegos, Netta Neat, DO  metFORMIN  (GLUCOPHAGE) 1000 MG tablet Take 1 tablet (1,000 mg total) by mouth 2 (two) times daily with a meal. 01/28/17   Karamalegos, Netta Neat, DO  naproxen (NAPROSYN) 500 MG tablet Take 1 tablet (500 mg total) by mouth 2 (two) times daily with a meal. For 2-4 weeks then as needed 03/19/17   Karamalegos, Netta Neat, DO  ONETOUCH DELICA LANCETS 33G MISC 1 each by Does not apply route 2 (two) times daily. Dx: E11.9 04/30/16   Smitty Cords, DO  pantoprazole (PROTONIX) 40 MG tablet Take 1 tablet (40 mg total) by mouth daily before breakfast. 01/28/17   Althea Charon, Netta Neat, DO    No Known Allergies  Family History  Problem Relation Age of Onset  . Alzheimer's disease Mother   . Diabetes Mother   . Hypertension Mother   . Cancer Father   . Heart failure Brother   . Diabetes Brother   . Hypertension Brother   . Heart disease Brother   . Prostate cancer Other        unknown  . Bladder Cancer Other        unknown  . Heart disease Son   . Diabetes Sister     Social History Social History   Tobacco Use  . Smoking status: Former Smoker    Packs/day: 1.50    Types: Cigarettes    Start date: 12/19/2001    Last attempt to quit: 07/03/2002    Years since quitting: 15.1  . Smokeless tobacco: Former Neurosurgeon  . Tobacco comment: quit smoking in 06/2001  Substance Use Topics  . Alcohol use: No    Alcohol/week: 0.0 oz  . Drug use: No    Review of Systems Constitutional: Negative for fever. Eyes: Negative for visual changes. ENT: Drooping of right face which is improved per patient. Cardiovascular: Negative for chest pain. Respiratory: Negative for shortness of breath. Gastrointestinal: Negative for abdominal pain, vomiting  Genitourinary: Negative for dysuria. Musculoskeletal: Negative for back pain. Skin: Negative for rash. Neurological: Negative for headache.  Right arm weakness. All other ROS negative  ____________________________________________   PHYSICAL EXAM:  VITAL  SIGNS: ED Triage Vitals [08/13/17 0926]  Enc Vitals Group     BP (!) 159/73     Pulse Rate 99     Resp 18     Temp 99.1 F (37.3 C)     Temp Source Oral     SpO2 98 %     Weight 234 lb (106.1 kg)     Height 5\' 10"  (1.778 m)     Head Circumference      Peak Flow      Pain Score      Pain Loc      Pain Edu?      Excl. in GC?    Constitutional: Alert and oriented. Well appearing and in no distress. Eyes: Normal exam ENT   Head: Normocephalic and atraumatic.   Mouth/Throat: Mucous membranes are moist. Cardiovascular: Normal rate, regular rhythm. No murmur Respiratory: Normal respiratory effort without tachypnea nor retractions. Breath sounds are clear Gastrointestinal: Soft and nontender. No  distention. Musculoskeletal: Nontender with normal range of motion in all extremities.  Neurologic: Normal speech and language.  Patient has a slight right facial droop on examination with mild pronator drift of the right upper extremity.  Equal grip strength, sensation intact, 5/5 motor in bilateral lower extremities. Skin:  Skin is warm, dry and intact.  Psychiatric: Mood and affect are normal.  ____________________________________________    EKG  EKG reviewed and interpreted by myself shows sinus tachycardia at 103 bpm with a narrow QRS, normal axis, normal intervals, nonspecific but no concerning ST changes.  ____________________________________________    RADIOLOGY  Possible pontine stroke on CT.  Possible temporal infarct.  ____________________________________________   INITIAL IMPRESSION / ASSESSMENT AND PLAN / ED COURSE  Pertinent labs & imaging results that were available during my care of the patient were reviewed by me and considered in my medical decision making (see chart for details).  Patient presents to the emergency department for right-sided weakness.  Differential would include CVA, paresthesias, peripheral neuropathy, ICH, tumor/mass.  We will check  labs, CT scan of the head and closely monitor in the emergency department.  On examination the patient does have a slight right facial droop as well as mild pronator drift of the right upper extremity.  Symptoms are very suggestive of CVA, if CT is negative we will proceed with MRI.  Patient agreeable to this plan of care.  NIH Stroke Scale   Interval: Baseline Time: 9:49 AM Person Administering Scale: Devaun Hernandez  Administer stroke scale items in the order listed. Record performance in each category after each subscale exam. Do not go back and change scores. Follow directions provided for each exam technique. Scores should reflect what the patient does, not what the clinician thinks the patient can do. The clinician should record answers while administering the exam and work quickly. Except where indicated, the patient should not be coached (i.e., repeated requests to patient to make a special effort).   1a  Level of consciousness: 0=alert; keenly responsive  1b. LOC questions:  0=Performs both tasks correctly  1c. LOC commands: 0=Performs both tasks correctly  2.  Best Gaze: 0=normal  3.  Visual: 0=No visual loss  4. Facial Palsy: 1=Minor paralysis (flattened nasolabial fold, asymmetric on smiling)  5a.  Motor left arm: 0=No drift, limb holds 90 (or 45) degrees for full 10 seconds  5b.  Motor right arm: 1=Drift, limb holds 90 (or 45) degrees but drifts down before full 10 seconds: does not hit bed  6a. motor left leg: 0=No drift, limb holds 90 (or 45) degrees for full 10 seconds  6b  Motor right leg:  0=No drift, limb holds 90 (or 45) degrees for full 10 seconds  7. Limb Ataxia: 0=Absent  8.  Sensory: 0=Normal; no sensory loss  9. Best Language:  0=No aphasia, normal  10. Dysarthria: 0=Normal  11. Extinction and Inattention: 0=No abnormality  12. Distal motor function: 0=Normal   Total:   2   I reviewed the patient's records which are largely noncontributory to today's ER  visit.  CT scan shows possible pontine and left temporal infarct we will obtain an MRI to confirm.  Patient agreeable to plan of care.  MRI confirms acute infarct.  We will dose aspirin and admit to the hospital for further treatment.  I discussed the patient with Dr. Thad Ranger who agrees.  Patient is outside of any TPA window. ____________________________________________   FINAL CLINICAL IMPRESSION(S) / ED DIAGNOSES  Right-sided weakness CVA  Minna AntisPaduchowski, Jeevan Kalla, MD 08/13/17 1256

## 2017-08-13 NOTE — ED Notes (Signed)
Pt back from MRI 

## 2017-08-13 NOTE — Plan of Care (Signed)
Pt admitted from the ED. VSS. Neuro checks WDL. NIH 0. Ambulates independently.

## 2017-08-13 NOTE — ED Notes (Signed)
Attempt to call report X 1  

## 2017-08-13 NOTE — ED Notes (Signed)
Pt taken to room by Miguel DibbleEd Tech, Lafonda Mossesiana via wheelchair. Pt prefers to walk to hospital room but patient agrees to go in wheelchair.

## 2017-08-13 NOTE — ED Notes (Signed)
ED Provider at bedside. 

## 2017-08-13 NOTE — ED Triage Notes (Addendum)
Pt to ER via POV from home, states that "I think I had a stroke". Slurred speech per family member. "funny feeling" to back to right side of neck, denies numbness. Pt reports right sided weakness beginning Sunday. Pt ambulates with ease, offered wheelchair multiple times-denies, speaks in full and complete sentences at this time. No obvious signs of stroke at this time. Pt in RR even and unlabored, pt in NAD.

## 2017-08-13 NOTE — Telephone Encounter (Signed)
Pt called today with symptoms of slurred speech, requesting to be seen in clinic today.  Discussed symptoms with pt to triage. - Onset of slurred speech occurred on Sunday.  He did not seek medical care and this is his first contact with anyone regarding his symptoms. - He has had no additional symptoms of leg or arm weakness, face drooping, or fatigue.   Instructed pt, that with sudden onset of slurred speech it is possible he has had a stroke.  Initial treatment and diagnosis would likely require a head CT or head MRI and he should be evaluated at the ED.  Reviewed signs and symptoms of CVA with patient and instructed him to go to ED.  Since is no longer acute CVA, can travel via private vehicle with another person driving.  For future changes of speech, cognition, mental status, leg/arm weakness encouraged pt to seek care as soon as symptoms are recognized.

## 2017-08-13 NOTE — Progress Notes (Signed)
Patient is refusing his Neuro checks and Lovenox. I explained the importance of letting me do this but he still refused, will continue to monitor.

## 2017-08-13 NOTE — ED Notes (Signed)
Pt to MRI

## 2017-08-14 DIAGNOSIS — E119 Type 2 diabetes mellitus without complications: Secondary | ICD-10-CM | POA: Diagnosis not present

## 2017-08-14 DIAGNOSIS — I10 Essential (primary) hypertension: Secondary | ICD-10-CM | POA: Diagnosis not present

## 2017-08-14 DIAGNOSIS — K219 Gastro-esophageal reflux disease without esophagitis: Secondary | ICD-10-CM | POA: Diagnosis not present

## 2017-08-14 DIAGNOSIS — I639 Cerebral infarction, unspecified: Secondary | ICD-10-CM | POA: Diagnosis not present

## 2017-08-14 LAB — ECHOCARDIOGRAM COMPLETE
Height: 70 in
Weight: 3776 oz

## 2017-08-14 LAB — GLUCOSE, CAPILLARY: Glucose-Capillary: 174 mg/dL — ABNORMAL HIGH (ref 65–99)

## 2017-08-14 MED ORDER — ATORVASTATIN CALCIUM 80 MG PO TABS: 80.0000 mg | ORAL_TABLET | Freq: Every evening | ORAL | 0 refills | Status: DC

## 2017-08-14 MED ORDER — CLOPIDOGREL BISULFATE 75 MG PO TABS: 75.0000 mg | ORAL_TABLET | Freq: Every day | ORAL | 0 refills | Status: DC

## 2017-08-14 NOTE — Progress Notes (Signed)
Pt refused all morning meds during assessment and conversation. Pt stated he's "ready to go and isn't taking anything else."

## 2017-08-14 NOTE — Progress Notes (Signed)
Pt D/C to home. Wife picked him up. D/C papers explained. IV removed intact. VSS

## 2017-08-14 NOTE — Progress Notes (Addendum)
SLP Cancellation Note  Patient Details Name: Bruce Richardson MRN: [id number] DOB: 01/20/1951   Cancelled treatment:       Reason Eval/Treat Not Completed: SLP screened, no needs identified, will sign off(chart reviewed; consulted NSG then met w/ pt). Pt admitted w/ difficulty w/ speech since Sunday but stated this is "better now". When asked further about any new changes or difficulties he said "no" and stated he wanted to "be out of here by noon". Encouraged pt to f/u w/ his PCP if he felt he needed further speech-language assessment d/t any changes in his speech or language or cognition from this event, and an Outpatient referral could be given for evaluation. Pt is conversing at conversational level w/ 100% intelligibility w/ SLP and staff; follows all instructions. No difficulty swallowing reported by pt or NSG staff; swallowing pills w/ liquids adequately. As pt appears to be at/near his baseline, NSG to reconsult if any decline in status while admitted. Pt agreed. NSG updated.      Katherine Watson, MS, CCC-SLP Watson,Katherine 08/14/2017, 9:30 AM   

## 2017-08-14 NOTE — Progress Notes (Signed)
OT Screen Note  Patient Details Name: Bruce Richardson MRN: [id number] DOB: Mar 28, 1951   Cancelled Treatment:    Reason Eval/Treat Not Completed: OT screened, no needs identified, will sign off. Order received, chart reviewed. Pt screened, no strength/sensory/visual/cognitive/coordination deficits noted. No functional deficits noted. Pt reporting back to normal, very eager to return home. No skilled OT needs identified, will sign off. Please re-consult if additional needs arise.   Richrd PrimeJamie Stiller, MPH, MS, OTR/L ascom [phone] 08/14/17, 9:41 AM

## 2017-08-14 NOTE — Evaluation (Signed)
Physical Therapy Evaluation Patient Details Name: Bruce ActonHarold D Snodgrass MRN: [id number] DOB: 15-Apr-1951 Today's Date: 08/14/2017   History of Present Illness  Pt is a 66 y.o. male presenting to hospital with R UE weakness and speech difficulty.  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  PMH includes DM, htn, bulging lumbar disc, h/o RC repair, complete RC rupture L shoulder.  Clinical Impression  Prior to hospital admission, pt was independent.  Currently pt is independent with bed mobility, transfers, gait, and modified independent with stairs navigation.  Pt scored 23/24 on DGI balance assessment indicating pt is at low risk for falls.  Intact B LE light touch, proprioception, tone, heel to shin coordination, and strength.  No acute PT needs identified during hospitalization or upon discharge from hospital.  Will complete current PT order and discharge pt from PT in house.  Please re-consult PT if pt's status changes and acute PT needs are identified.    Follow Up Recommendations No PT follow up    Equipment Recommendations  None recommended by PT    Recommendations for Other Services       Precautions / Restrictions Precautions Precautions: None Restrictions Weight Bearing Restrictions: No      Mobility  Bed Mobility Overal bed mobility: Independent             General bed mobility comments: Supine to sit without any difficulties noted.  Transfers Overall transfer level: Independent Equipment used: None             General transfer comment: steady safe transfers  Ambulation/Gait Ambulation/Gait assistance: Independent Ambulation Distance (Feet): 250 Feet Assistive device: None Gait Pattern/deviations: WFL(Within Functional Limits)   Gait velocity interpretation: at or above normal speed for age/gender General Gait Details: steady ambulation  Stairs Stairs: Yes Stairs assistance: Modified independent (Device/Increase time) Stair  Management: One rail Right;Alternating pattern;Forwards Number of Stairs: 6 General stair comments: steady safe stairs navigation  Wheelchair Mobility    Modified Rankin (Stroke Patients Only)       Balance Overall balance assessment: Independent                               Standardized Balance Assessment Standardized Balance Assessment : Dynamic Gait Index   Dynamic Gait Index Level Surface: Normal Change in Gait Speed: Normal Gait with Horizontal Head Turns: Normal Gait with Vertical Head Turns: Normal Gait and Pivot Turn: Normal Step Over Obstacle: Normal Step Around Obstacles: Normal Steps: Mild Impairment(uses railing) Total Score: 23       Pertinent Vitals/Pain Pain Assessment: No/denies pain  Vitals (HR and O2 on room air) stable and WFL throughout treatment session.    Home Living Family/patient expects to be discharged to:: Private residence     Type of Home: House Home Access: Stairs to enter Entrance Stairs-Rails: Right Entrance Stairs-Number of Steps: 4-5 Home Layout: One level Home Equipment: None      Prior Function Level of Independence: Independent         Comments: Pt denies any recent falls.     Hand Dominance        Extremity/Trunk Assessment   Upper Extremity Assessment Upper Extremity Assessment: Overall WFL for tasks assessed;Defer to OT evaluation    Lower Extremity Assessment Lower Extremity Assessment: Overall WFL for tasks assessed(Intact B LE light touch, proprioception, tone, and heel to shin coordination.  B LE strength WFL.)    Cervical / Trunk Assessment  Cervical / Trunk Assessment: Normal  Communication   Communication: No difficulties  Cognition Arousal/Alertness: Awake/alert Behavior During Therapy: WFL for tasks assessed/performed Overall Cognitive Status: Within Functional Limits for tasks assessed                                 General Comments: Eager to discharge home       General Comments   Nursing cleared pt for participation in physical therapy.  Pt agreeable to PT session.  Pt reports he told the doctor he had h/o R ankle issues and did not have any R sided weakness when he came to the hospital; pt eager to discharge home.    Exercises     Assessment/Plan    PT Assessment Patent does not need any further PT services  PT Problem List         PT Treatment Interventions      PT Goals (Current goals can be found in the Care Plan section)  Acute Rehab PT Goals Patient Stated Goal: to go home soon PT Goal Formulation: With patient Time For Goal Achievement: 08/28/17 Potential to Achieve Goals: Good    Frequency     Barriers to discharge        Co-evaluation               AM-PAC PT "6 Clicks" Daily Activity  Outcome Measure Difficulty turning over in bed (including adjusting bedclothes, sheets and blankets)?: None Difficulty moving from lying on back to sitting on the side of the bed? : None Difficulty sitting down on and standing up from a chair with arms (e.g., wheelchair, bedside commode, etc,.)?: None Help needed moving to and from a bed to chair (including a wheelchair)?: None Help needed walking in hospital room?: None Help needed climbing 3-5 steps with a railing? : None 6 Click Score: 24    End of Session   Activity Tolerance: Patient tolerated treatment well Patient left: (standing in room with nursing tech present) Nurse Communication: Mobility status PT Visit Diagnosis: Muscle weakness (generalized) (M62.81)    Time: [id number] PT Time Calculation (min) (ACUTE ONLY): 14 min   Charges:   PT Evaluation $PT Eval Low Complexity: 1 Low     PT G Codes:   PT G-Codes **NOT FOR INPATIENT CLASS** Functional Assessment Tool Used: AM-PAC 6 Clicks Basic Mobility Functional Limitation: Mobility: Walking and moving around Mobility: Walking and Moving Around Current Status (V4098(G8978): 0 percent impaired, limited or  restricted Mobility: Walking and Moving Around Goal Status (J1914(G8979): 0 percent impaired, limited or restricted Mobility: Walking and Moving Around Discharge Status (N8295(G8980): 0 percent impaired, limited or restricted    Hendricks LimesEmily Doloros Kwolek, PT 08/14/17, 10:20 AM [phone]

## 2017-08-14 NOTE — Consult Note (Signed)
Referring Physician: Renae Gloss    Chief Complaint: Slurred speech  HPI: Bruce Richardson is an 66 y.o. male with a history of DM, HTN and HLD who presented with a 3 day history of slurred speech and right sided weakness.  Although the symptoms got some better he felt he still was unable to control his right arm well and still had slurred speech.  Initial NIHSS of 0.  Date last known well: Date: 08/09/2017 Time last known well: Unable to determine tPA Given: No: Outside time window  Past Medical History:  Diagnosis Date  . Arthritis   . Bulging lumbar disc    lower back  . Complication of anesthesia    kept moving while under anesthesia during colonoscopy, hard to put under.  . Diabetes mellitus without complication (HCC)   . GERD (gastroesophageal reflux disease)   . Hyperlipidemia   . Hypertension     Past Surgical History:  Procedure Laterality Date  . CIRCUMCISION N/A 07/24/2015   Procedure: CIRCUMCISION ADULT;  Surgeon: Hildred Laser, MD;  Location: [facility] ORS;  Service: Urology;  Laterality: N/A;  . SHOULDER ARTHROSCOPY Bilateral   . SHOULDER ARTHROSCOPY Right 12/22/2014   Procedure: RIGHT SHOULDER ARTHROSCOPY /DECOMPRESSION/ROTATOR CUFF REPAIR OF RECURRENT ROTATOR CUFF TEAR;  Surgeon: Christena Flake, MD;  Location: [facility] ORS;  Service: Orthopedics;  Laterality: Right;    Family History  Problem Relation Age of Onset  . Alzheimer's disease Mother   . Diabetes Mother   . Hypertension Mother   . Cancer Father   . Heart failure Brother   . Diabetes Brother   . Hypertension Brother   . Heart disease Brother   . Prostate cancer Other        unknown  . Bladder Cancer Other        unknown  . Heart disease Son   . Diabetes Sister    Social History:  reports that he quit smoking about 15 years ago. His smoking use included cigarettes. He started smoking about 15 years ago. He smoked 1.50 packs per day. He has quit using smokeless tobacco. He reports that he does not drink alcohol  or use drugs.  Allergies: No Known Allergies  Medications:  I have reviewed the patient's current medications. Prior to Admission:  No medications prior to admission.   Scheduled: . aspirin EC  81 mg Oral Daily  . atorvastatin  40 mg Oral QPM  . clopidogrel  75 mg Oral Daily  . enoxaparin (LOVENOX) injection  40 mg Subcutaneous Q24H  . insulin aspart  0-5 Units Subcutaneous QHS  . insulin aspart  0-9 Units Subcutaneous TID WC  . pantoprazole  40 mg Oral QAC breakfast    ROS: History obtained from the patient  General ROS: negative for - chills, fatigue, fever, night sweats, weight gain or weight loss Psychological ROS: negative for - behavioral disorder, hallucinations, memory difficulties, mood swings or suicidal ideation Ophthalmic ROS: negative for - blurry vision, double vision, eye pain or loss of vision ENT ROS: negative for - epistaxis, nasal discharge, oral lesions, sore throat, tinnitus or vertigo Allergy and Immunology ROS: negative for - hives or itchy/watery eyes Hematological and Lymphatic ROS: negative for - bleeding problems, bruising or swollen lymph nodes Endocrine ROS: negative for - galactorrhea, hair pattern changes, polydipsia/polyuria or temperature intolerance Respiratory ROS: negative for - cough, hemoptysis, shortness of breath or wheezing Cardiovascular ROS: negative for - chest pain, dyspnea on exertion, edema or irregular heartbeat Gastrointestinal ROS: negative for -  abdominal pain, diarrhea, hematemesis, nausea/vomiting or stool incontinence Genito-Urinary ROS: negative for - dysuria, hematuria, incontinence or urinary frequency/urgency Musculoskeletal ROS: negative for - joint swelling or muscular weakness Neurological ROS: as noted in HPI Dermatological ROS: negative for rash and skin lesion changes  Physical Examination: Blood pressure 137/73, pulse 83, temperature 98.9 F (37.2 C), temperature source Oral, resp. rate 14, height 5\' 10"  (1.778  m), weight 107 kg (236 lb), SpO2 97 %.  HEENT-  Normocephalic, no lesions, without obvious abnormality.  Normal external eye and conjunctiva.  Normal TM's bilaterally.  Normal auditory canals and external ears. Normal external nose, mucus membranes and septum.  Normal pharynx. Cardiovascular- S1, S2 normal, pulses palpable throughout   Lungs- chest clear, no wheezing, rales, normal symmetric air entry Abdomen- soft, non-tender; bowel sounds normal; no masses,  no organomegaly Extremities- no edema Lymph-no adenopathy palpable Musculoskeletal-no joint tenderness, deformity or swelling Skin-warm and dry, no hyperpigmentation, vitiligo, or suspicious lesions  Neurological Examination   Mental Status: Alert, oriented, but agitated and uncooperative.  Speech fluent without evidence of aphasia.  Mild dysarthria.  Able to follow 3 step commands without difficulty. Cranial Nerves: II: Discs flat bilaterally; Right visual field cut III,IV, VI: ptosis not present, extra-ocular motions intact bilaterally V,VII: decreased right NLF, facial light touch sensation normal bilaterally VIII: hearing normal bilaterally IX,X: gag reflex present XI: bilateral shoulder shrug XII: midline tongue extension Motor: Right : Upper extremity   5/5    Left:     Upper extremity   5/5  Lower extremity   5/5     Lower extremity   5/5 Tone and bulk:normal tone throughout; no atrophy noted Sensory: Pinprick and light touch intact throughout, bilaterally Deep Tendon Reflexes: 1+ in the upper extremities and absent in the lower extremities Plantars: Right: mute   Left: mute Cerebellar: Mild RUE dysmetria and normal heel-to-shin testing bilaterally Gait: not tested due to safety concerns  Laboratory Studies:  Basic Metabolic Panel: Recent Labs  Lab 08/13/17 0953  NA 135  K 4.0  CL 102  CO2 25  GLUCOSE 208*  BUN 28*  CREATININE 1.26*  CALCIUM 9.5    Liver Function Tests: Recent Labs  Lab 08/13/17 0953   AST 19  ALT 17  ALKPHOS 62  BILITOT 1.1  PROT 7.7  ALBUMIN 3.8   No results for input(s): LIPASE, AMYLASE in the last 168 hours. No results for input(s): AMMONIA in the last 168 hours.  CBC: Recent Labs  Lab 08/13/17 0953  WBC 9.0  NEUTROABS 5.2  HGB 15.5  HCT 47.1  MCV 88.3  PLT 252    Cardiac Enzymes: Recent Labs  Lab 08/13/17 0953  TROPONINI <0.03    BNP: Invalid input(s): POCBNP  CBG: Recent Labs  Lab 08/13/17 1647 08/13/17 2122 08/14/17 0741  GLUCAP 125* 151* 174*    Microbiology: No results found for this or any previous visit.  Coagulation Studies: Recent Labs    08/13/17 0953  LABPROT 12.4  INR 0.93    Urinalysis: No results for input(s): COLORURINE, LABSPEC, PHURINE, GLUCOSEU, HGBUR, BILIRUBINUR, KETONESUR, PROTEINUR, UROBILINOGEN, NITRITE, LEUKOCYTESUR in the last 168 hours.  Invalid input(s): APPERANCEUR  Lipid Panel:    Component Value Date/Time   CHOL 170 08/13/2017 0953   CHOL 189 07/05/2015 0832   TRIG 115 08/13/2017 0953   HDL 28 (L) 08/13/2017 0953   HDL 30 (L) 07/05/2015 0832   CHOLHDL 6.1 08/13/2017 0953   VLDL 23 08/13/2017 0953   LDLCALC 119 (H)  08/13/2017 0953   LDLCALC 126 (H) 07/05/2015 0832    HgbA1C:  Lab Results  Component Value Date   HGBA1C 7.7 (H) 08/13/2017    Urine Drug Screen:  No results found for: LABOPIA, COCAINSCRNUR, LABBENZ, AMPHETMU, THCU, LABBARB  Alcohol Level: No results for input(s): ETH in the last 168 hours.  Other results: EKG: sinus tachycardia at 103 bpm.  Imaging: Ct Head Wo Contrast  Result Date: 08/13/2017 CLINICAL DATA:  Slurred speech per family member, funny feeling to the back of the RIGHT-side of his neck, focal neural deficit of greater than 6 hours suspected stroke, history hypertension and diabetes mellitus EXAM: CT HEAD WITHOUT CONTRAST TECHNIQUE: Contiguous axial images were obtained from the base of the skull through the vertex without intravenous contrast. Sagittal  and coronal MPR images reconstructed from axial data set. COMPARISON:  None. FINDINGS: Brain: Mild generalized atrophy. Normal ventricular morphology. No midline shift or mass effect. Small vessel chronic ischemic changes of deep cerebral white matter. Low-attenuation at anterior LEFT temporal lobe question subtle infarct versus beam hardening artifact. Suspected small deep LEFT pontine infarct. No additional areas of infarction. No intracranial hemorrhage, mass lesion or extra-axial fluid collection. Vascular: Atherosclerotic calcification of internal carotid arteries bilaterally at skullbase Skull: Intact Sinuses/Orbits: Clear Other: N/A IMPRESSION: Atrophy with small vessel chronic ischemic changes of deep cerebral white matter. Probable small LEFT pontine infarct. Low-attenuation and anterior LEFT temporal lobe either representing subtle infarct or beam hardening artifact. Electronically Signed   By: Ulyses Southward M.D.   On: 08/13/2017 10:37   Mr Brain Wo Contrast  Result Date: 08/13/2017 CLINICAL DATA:  66 year old male with slurred speech and abnormal sensation along the right neck. Symptoms for 3 days. EXAM: MRI HEAD WITHOUT CONTRAST TECHNIQUE: Multiplanar, multiecho pulse sequences of the brain and surrounding structures were obtained without intravenous contrast. COMPARISON:  Head CT without contrast 1015 hours today. FINDINGS: Brain: Confluent 17 mm area of restricted diffusion in the left paracentral pons (series 7, image 17) with associated T2 and FLAIR hyperintensity, and T1 hypointensity. No associated hemorrhage or mass effect. This corresponds to the left pons CT hypodensity today. No other restricted diffusion. No midline shift, mass effect, evidence of mass lesion, ventriculomegaly, extra-axial collection or acute intracranial hemorrhage. Cervicomedullary junction and pituitary are within normal limits. Patchy in scattered bilateral cerebral white matter T2 and FLAIR hyperintensity, moderately  advanced for age. Mild involvement of the temporal lobes, including the anterior temporal subcortical white matter which corresponds to some of the CT hypodensity there today. No cortical encephalomalacia or chronic cerebral blood products identified. Deep gray matter nuclei and brainstem are normal. Vascular: Major intracranial vascular flow voids are preserved. Mild intracranial artery tortuosity. Skull and upper cervical spine: Negative visualized cervical spine. Normal bone marrow signal. Sinuses/Orbits: Normal orbits soft tissues. Paranasal sinuses are clear. Other: Visible internal auditory structures appear normal. Mastoid air cells are clear. Negative scalp and face soft tissues. IMPRESSION: 1. Acute to subacute lacunar type infarct in the left pons. No associated hemorrhage or mass effect. 2. Moderately advanced nonspecific cerebral white matter signal changes in both hemispheres, most commonly due to chronic small vessel disease. Electronically Signed   By: Odessa Fleming M.D.   On: 08/13/2017 12:28   US Carotid Bilateral (at [facility] And [facility] Only)  Result Date: 08/13/2017 CLINICAL DATA:  Stroke. EXAM: BILATERAL CAROTID DUPLEX ULTRASOUND TECHNIQUE: Wallace Cullens scale imaging, color Doppler and duplex ultrasound were performed of bilateral carotid and vertebral arteries in the neck. COMPARISON:  None.  FINDINGS: Criteria: Quantification of carotid stenosis is based on velocity parameters that correlate the residual internal carotid diameter with NASCET-based stenosis levels, using the diameter of the distal internal carotid lumen as the denominator for stenosis measurement. The following velocity measurements were obtained: RIGHT ICA:  104 cm/sec CCA:  112 cm/sec SYSTOLIC ICA/CCA RATIO:  0.9 DIASTOLIC ICA/CCA RATIO:  2.1 ECA:  112 cm/sec LEFT ICA:  102 cm/sec CCA:  116 cm/sec SYSTOLIC ICA/CCA RATIO:  0.9 DIASTOLIC ICA/CCA RATIO:  1.6 ECA:  141 cm/sec RIGHT CAROTID ARTERY: Intimal thickening in the right common carotid  artery. Heterogeneous plaque at the right carotid bulb. External carotid artery is patent with normal waveform. Small amount of echogenic plaque in the proximal internal carotid artery. Normal waveforms and velocities in the internal carotid artery. RIGHT VERTEBRAL ARTERY: Antegrade flow and normal waveform in the right vertebral artery. LEFT CAROTID ARTERY: Intimal thickening in the left common carotid artery. External carotid artery is patent with normal waveform. Small amount of plaque in the left internal carotid artery. Normal waveforms and velocities in the internal carotid artery. LEFT VERTEBRAL ARTERY: Antegrade flow and normal waveform in the left vertebral artery. 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 Estimated degree of stenosis in the internal carotid arteries is less than 50% bilaterally. Patent vertebral arteries with antegrade flow. Electronically Signed   By: Richarda OverlieAdam  Henn M.D.   On: 08/13/2017 17:49   Mr Maxine GlennMra Head/brain WUWo Cm  Result Date: 08/13/2017 CLINICAL DATA:  Follow-up subacute stroke. EXAM: MRA HEAD WITHOUT CONTRAST TECHNIQUE: Angiographic images of the Circle of Willis were obtained using MRA technique without intravenous contrast. COMPARISON:  MRI of the head August 13, 2017 at 1142 hours FINDINGS: ANTERIOR CIRCULATION: Normal flow related enhancement of the included cervical, petrous, cavernous and supraclinoid internal carotid arteries. Patent anterior communicating artery. Patent anterior and middle cerebral arteries, including distal segments. No large vessel occlusion, flow limiting stenosis, aneurysm. POSTERIOR CIRCULATION: Codominant vertebral artery's Basilar artery is patent, with normal flow related enhancement of the main branch vessels. Patent posterior cerebral arteries. Robust RIGHT posterior communicating artery present. No large vessel occlusion, flow limiting stenosis,  aneurysm. ANATOMIC VARIANTS: None. Source images and MIP images were  reviewed. IMPRESSION: 1. No emergent large vessel occlusion or flow limiting stenosis. Electronically Signed   By: Awilda Metroourtnay  Bloomer M.D.   On: 08/13/2017 18:22    Assessment: 66 y.o. male with multiple vascular risk factors who presents with slurred speech and right sided weakness.  Symptoms have improved although patient not back to baseline.  On ASA and a statin at home.  MRI of the brain reviewed and shows an acute left pontine infarct.  Etiology likely small vessel disease.  Carotid dopplers show no evidence of hemodynamically significant stenosis.  Echocardiogram shows no cardiac source of emboli with an EF of 65-70%.  A1c 7.7, LDL 119.  Stroke Risk Factors - diabetes mellitus, hyperlipidemia and hypertension  Plan: 1. PT consult, OT consult, Speech consult 2. Prophylactic therapy-Antiplatelet med: Plavix - dose 75mg  daily 3. Telemetry monitoring 4. Frequent neuro checks 5. Blood sugar management with target A1c<7.0 6. Aggressive lipid management with target LDL<70.   Thana FarrLeslie Darien Kading, MD Neurology [phone] 08/14/2017, 12:35 PM

## 2017-08-14 NOTE — Discharge Summary (Signed)
[facility] - [place] at [facility]   PATIENT NAME: Bruce Richardson    MR#:  [id number]  DATE OF BIRTH:  1951/04/07  DATE OF ADMISSION:  08/13/2017 ADMITTING PHYSICIAN: Adrian SaranSital Mody, MD  DATE OF DISCHARGE: 08/14/2017 11:52 AM  PRIMARY CARE PHYSICIAN: Smitty CordsKaramalegos, Alexander J, DO    ADMISSION DIAGNOSIS:  Cerebrovascular accident (CVA), unspecified mechanism (HCC) [I63.9]  DISCHARGE DIAGNOSIS:  Active Problems:   CVA (cerebral vascular accident) (HCC)   SECONDARY DIAGNOSIS:   Past Medical History:  Diagnosis Date  . Arthritis   . Bulging lumbar disc    lower back  . Complication of anesthesia    kept moving while under anesthesia during colonoscopy, hard to put under.  . Diabetes mellitus without complication (HCC)   . GERD (gastroesophageal reflux disease)   . Hyperlipidemia   . Hypertension     HOSPITAL COURSE:   1.  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  Continue home aspirin.  And Plavix and increased dose of Lipitor to 80 mg daily.  Patient told me he is going home.  He did well with physical therapy.  Patient told me he is leaving the hospital.  Carotid ultrasound unremarkable.  Echocardiogram no source of stroke. 2.  Type 2 diabetes mellitus.  Can restart metformin as outpatient.  Last hemoglobin A1c 7.7.  Patient states that he was previously taking Victoza as outpatient but has stopped secondary to cost. 3.  Hyperlipidemia unspecified.  LDL 119.  Goal less than 70.  Increase Lipitor to 80 mg daily 4.  Essential hypertension continue lisinopril as outpatient 5.  GERD on Protonix  DISCHARGE CONDITIONS:   Satisfactory  CONSULTS OBTAINED:  Treatment Team:  Thana Farreynolds, Leslie, MD  DRUG ALLERGIES:  No Known Allergies  DISCHARGE MEDICATIONS:   Allergies as of 08/14/2017   No Known Allergies     Medication List    STOP taking these medications   naproxen 500 MG tablet Commonly known as:  NAPROSYN     TAKE these medications   ACCU-CHEK  SOFTCLIX LANCETS lancets 2 (two) times daily. for testing   [facility] LANC[facility] 33G Misc 1 each by Does not apply route 2 (two) times daily. Dx: E11.9   aspirin EC 81 MG tablet Take 1 tablet (81 mg total) by mouth daily.   atorvastatin 80 MG tablet Commonly known as:  LIPITOR Take 1 tablet (80 mg total) by mouth every evening. What changed:    medication strength  how much to take   baclofen 10 MG tablet Commonly known as:  LIORESAL Take 0.5-1 tablets (5-10 mg total) by mouth 3 (three) times daily as needed for muscle spasms.   clopidogrel 75 MG tablet Commonly known as:  PLAVIX Take 1 tablet (75 mg total) by mouth daily. Start taking on:  08/15/2017   clotrimazole-betamethasone cream Commonly known as:  LOTRISONE Apply 1 application 2 (two) times daily topically. For 2 weeks then stop   fluticasone 50 MCG/ACT nasal spray Commonly known as:  FLONASE Place 2 sprays into both nostrils daily. Use for 4-6 weeks then stop and use seasonally or as needed.   glucose blood test strip Check blood sugar twice daily. Dx.E11.9   lisinopril 40 MG tablet Commonly known as:  PRINIVIL,ZESTRIL Take 1 tablet (40 mg total) by mouth daily.   metFORMIN 1000 MG tablet Commonly known as:  GLUCOPHAGE Take 1 tablet (1,000 mg total) by mouth 2 (two) times daily with a meal.   pantoprazole 40 MG tablet Commonly  known as:  PROTONIX Take 1 tablet (40 mg total) by mouth daily before breakfast.   Pen Needles 32G X 4 MM Misc 1 Device by Does not apply route every 7 (seven) days.        DISCHARGE INSTRUCTIONS:   Follow-up PMD 1 week  If you experience worsening of your admission symptoms, develop shortness of breath, life threatening emergency, suicidal or homicidal thoughts you must seek medical attention immediately by calling 911 or calling your MD immediately  if symptoms less severe.  You Must read complete instructions/literature along with all the possible adverse  reactions/side effects for all the Medicines you take and that have been prescribed to you. Take any new Medicines after you have completely understood and accept all the possible adverse reactions/side effects.   Please note  You were cared for by a hospitalist during your hospital stay. If you have any questions about your discharge medications or the care you received while you were in the hospital after you are discharged, you can call the unit and asked to speak with the hospitalist on call if the hospitalist that took care of you is not available. Once you are discharged, your primary care physician will handle any further medical issues. Please note that NO REFILLS for any discharge medications will be authorized once you are discharged, as it is imperative that you return to your primary care physician (or establish a relationship with a primary care physician if you do not have one) for your aftercare needs so that they can reassess your need for medications and monitor your lab values.    Today   CHIEF COMPLAINT:   Chief Complaint  Patient presents with  . Aphasia  . Weakness    HISTORY OF PRESENT ILLNESS:  Bruce Richardson  is a 66 y.o. male came in with strokelike symptoms and found to have an acute to subacute stroke in the left pons   VITAL SIGNS:  Blood pressure 137/73, pulse 83, temperature 98.9 F (37.2 C), temperature source Oral, resp. rate 14, height 5\' 10"  (1.778 m), weight 107 kg (236 lb), SpO2 97 %.    PHYSICAL EXAMINATION:  GENERAL:  66 y.o.-year-old patient lying in the bed with no acute distress.  EYES: Pupils equal, round, reactive to light and accommodation. No scleral icterus. Extraocular muscles intact.  HEENT: Head atraumatic, normocephalic. Oropharynx and nasopharynx clear.  NECK:  Supple, no jugular venous distention. No thyroid enlargement, no tenderness.  LUNGS: Normal breath sounds bilaterally, no wheezing, rales,rhonchi or crepitation. No use of  accessory muscles of respiration.  CARDIOVASCULAR: S1, S2 normal. No murmurs, rubs, or gallops.  ABDOMEN: Soft, non-tender, non-distended. Bowel sounds present. No organomegaly or mass.  EXTREMITIES: No pedal edema, cyanosis, or clubbing.  NEUROLOGIC: Cranial nerves II through XII are intact. Muscle strength 5/5 in all extremities. Sensation intact. Gait not checked.  PSYCHIATRIC: The patient is alert and oriented x 3.  SKIN: No obvious rash, lesion, or ulcer.   DATA REVIEW:   CBC Recent Labs  Lab 08/13/17 0953  WBC 9.0  HGB 15.5  HCT 47.1  PLT 252    Chemistries  Recent Labs  Lab 08/13/17 0953  NA 135  K 4.0  CL 102  CO2 25  GLUCOSE 208*  BUN 28*  CREATININE 1.26*  CALCIUM 9.5  AST 19  ALT 17  ALKPHOS 62  BILITOT 1.1    Cardiac Enzymes Recent Labs  Lab 08/13/17 0953  TROPONINI <0.03      RADIOLOGY:  Ct Head Wo Contrast  Result Date: 08/13/2017 CLINICAL DATA:  Slurred speech per family member, funny feeling to the back of the RIGHT-side of his neck, focal neural deficit of greater than 6 hours suspected stroke, history hypertension and diabetes mellitus EXAM: CT HEAD WITHOUT CONTRAST TECHNIQUE: Contiguous axial images were obtained from the base of the skull through the vertex without intravenous contrast. Sagittal and coronal MPR images reconstructed from axial data set. COMPARISON:  None. FINDINGS: Brain: Mild generalized atrophy. Normal ventricular morphology. No midline shift or mass effect. Small vessel chronic ischemic changes of deep cerebral white matter. Low-attenuation at anterior LEFT temporal lobe question subtle infarct versus beam hardening artifact. Suspected small deep LEFT pontine infarct. No additional areas of infarction. No intracranial hemorrhage, mass lesion or extra-axial fluid collection. Vascular: Atherosclerotic calcification of internal carotid arteries bilaterally at skullbase Skull: Intact Sinuses/Orbits: Clear Other: N/A IMPRESSION:  Atrophy with small vessel chronic ischemic changes of deep cerebral white matter. Probable small LEFT pontine infarct. Low-attenuation and anterior LEFT temporal lobe either representing subtle infarct or beam hardening artifact. Electronically Signed   By: Ulyses SouthwardMark  Boles M.D.   On: 08/13/2017 10:37   Mr Brain Wo Contrast  Result Date: 08/13/2017 CLINICAL DATA:  66 year old male with slurred speech and abnormal sensation along the right neck. Symptoms for 3 days. EXAM: MRI HEAD WITHOUT CONTRAST TECHNIQUE: Multiplanar, multiecho pulse sequences of the brain and surrounding structures were obtained without intravenous contrast. COMPARISON:  Head CT without contrast 1015 hours today. FINDINGS: Brain: Confluent 17 mm area of restricted diffusion in the left paracentral pons (series 7, image 17) with associated T2 and FLAIR hyperintensity, and T1 hypointensity. No associated hemorrhage or mass effect. This corresponds to the left pons CT hypodensity today. No other restricted diffusion. No midline shift, mass effect, evidence of mass lesion, ventriculomegaly, extra-axial collection or acute intracranial hemorrhage. Cervicomedullary junction and pituitary are within normal limits. Patchy in scattered bilateral cerebral white matter T2 and FLAIR hyperintensity, moderately advanced for age. Mild involvement of the temporal lobes, including the anterior temporal subcortical white matter which corresponds to some of the CT hypodensity there today. No cortical encephalomalacia or chronic cerebral blood products identified. Deep gray matter nuclei and brainstem are normal. Vascular: Major intracranial vascular flow voids are preserved. Mild intracranial artery tortuosity. Skull and upper cervical spine: Negative visualized cervical spine. Normal bone marrow signal. Sinuses/Orbits: Normal orbits soft tissues. Paranasal sinuses are clear. Other: Visible internal auditory structures appear normal. Mastoid air cells are clear.  Negative scalp and face soft tissues. IMPRESSION: 1. Acute to subacute lacunar type infarct in the left pons. No associated hemorrhage or mass effect. 2. Moderately advanced nonspecific cerebral white matter signal changes in both hemispheres, most commonly due to chronic small vessel disease. Electronically Signed   By: Odessa FlemingH  Hall M.D.   On: 08/13/2017 12:28   [place] Carotid Bilateral (at [facility] Only)  Result Date: 08/13/2017 CLINICAL DATA:  Stroke. EXAM: BILATERAL CAROTID DUPLEX ULTRASOUND TECHNIQUE: Wallace CullensGray scale imaging, color Doppler and duplex ultrasound were performed of bilateral carotid and vertebral arteries in the neck. COMPARISON:  None. FINDINGS: Criteria: Quantification of carotid stenosis is based on velocity parameters that correlate the residual internal carotid diameter with NASCET-based stenosis levels, using the diameter of the distal internal carotid lumen as the denominator for stenosis measurement. The following velocity measurements were obtained: RIGHT ICA:  104 cm/sec CCA:  112 cm/sec SYSTOLIC ICA/CCA RATIO:  0.9 DIASTOLIC ICA/CCA RATIO:  2.1 ECA:  112 cm/sec LEFT  ICA:  102 cm/sec CCA:  116 cm/sec SYSTOLIC ICA/CCA RATIO:  0.9 DIASTOLIC ICA/CCA RATIO:  1.6 ECA:  141 cm/sec RIGHT CAROTID ARTERY: Intimal thickening in the right common carotid artery. Heterogeneous plaque at the right carotid bulb. External carotid artery is patent with normal waveform. Small amount of echogenic plaque in the proximal internal carotid artery. Normal waveforms and velocities in the internal carotid artery. RIGHT VERTEBRAL ARTERY: Antegrade flow and normal waveform in the right vertebral artery. LEFT CAROTID ARTERY: Intimal thickening in the left common carotid artery. External carotid artery is patent with normal waveform. Small amount of plaque in the left internal carotid artery. Normal waveforms and velocities in the internal carotid artery. LEFT VERTEBRAL ARTERY: Antegrade flow and normal waveform in the  left vertebral artery. 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 Estimated degree of stenosis in the internal carotid arteries is less than 50% bilaterally. Patent vertebral arteries with antegrade flow. Electronically Signed   By: Richarda Overlie M.D.   On: 08/13/2017 17:49   Mr Maxine Glenn Head/brain ZO Cm  Result Date: 08/13/2017 CLINICAL DATA:  Follow-up subacute stroke. EXAM: MRA HEAD WITHOUT CONTRAST TECHNIQUE: Angiographic images of the Circle of Willis were obtained using MRA technique without intravenous contrast. COMPARISON:  MRI of the head August 13, 2017 at 1142 hours FINDINGS: ANTERIOR CIRCULATION: Normal flow related enhancement of the included cervical, petrous, cavernous and supraclinoid internal carotid arteries. Patent anterior communicating artery. Patent anterior and middle cerebral arteries, including distal segments. No large vessel occlusion, flow limiting stenosis, aneurysm. POSTERIOR CIRCULATION: Codominant vertebral artery's Basilar artery is patent, with normal flow related enhancement of the main branch vessels. Patent posterior cerebral arteries. Robust RIGHT posterior communicating artery present. No large vessel occlusion, flow limiting stenosis,  aneurysm. ANATOMIC VARIANTS: None. Source images and MIP images were reviewed. IMPRESSION: 1. No emergent large vessel occlusion or flow limiting stenosis. Electronically Signed   By: Awilda Metro M.D.   On: 08/13/2017 18:22       Management plans discussed with the patient, and he has in agreement.  CODE STATUS:  Code Status History    Date Active Date Inactive Code Status Order ID Comments User Context   08/13/2017 15:43 08/14/2017 15:03 Full Code [id number]  Adrian Saran, MD Inpatient    Advance Directive Documentation     Most Recent Value  Type of Advance Directive  Living will  Pre-existing out of facility DNR order (yellow form or pink MOST form)  No data  "MOST" Form in Place?  No data       TOTAL TIME TAKING CARE OF THIS PATIENT: 35 minutes.    Alford Highland M.D on 08/14/2017 at 5:35 PM  Between 7am to 6pm - Pager - [phone]  After 6pm go to [email or web address] - Social research officer, government  [facility] Office  [phone]  CC: Primary care physician; Smitty Cords, DO

## 2017-08-15 ENCOUNTER — Telehealth: Payer: Self-pay

## 2017-08-15 NOTE — Telephone Encounter (Signed)
Transition Care Management Follow-up Telephone Call  Spoke with patients wife   Date discharged? 08/14/2017   How have you been since you were released from the hospital? Yes    Do you understand why you were in the hospital? yes   Do you understand the discharge instructions?yes   Where were you discharged to? home   Items Reviewed:  Medications reviewed: yes  Allergies reviewed: yes  Dietary changes reviewed: yes  Referrals reviewed: yes   Functional Questionnaire:   Activities of Daily Living (ADLs):   He states they are independent in the following: feeding,bathing, trasnportation,ambulation,continence,dressing,grooming States they require assistance with the following: none   Any transportation issues/concerns?: no   Any patient concerns? no   Confirmed importance and date/time of follow-up visits scheduled yes  Patient to call back to schedule follow up appointment with Dr.Karamalegos within the next week.   Confirmed with patient if condition begins to worsen call PCP or go to the ER.  Patient was given the office number and encouraged to call back with question or concerns.  : yes

## 2017-08-18 ENCOUNTER — Encounter: Payer: Self-pay | Admitting: Family Medicine

## 2017-08-18 ENCOUNTER — Ambulatory Visit (INDEPENDENT_AMBULATORY_CARE_PROVIDER_SITE_OTHER): Payer: Medicare HMO | Admitting: Family Medicine

## 2017-08-18 VITALS — BP 148/72 | HR 89 | Temp 99.1°F | Resp 16 | Ht 71.0 in | Wt 240.0 lb

## 2017-08-18 DIAGNOSIS — I6930 Unspecified sequelae of cerebral infarction: Secondary | ICD-10-CM | POA: Diagnosis not present

## 2017-08-18 DIAGNOSIS — M4726 Other spondylosis with radiculopathy, lumbar region: Secondary | ICD-10-CM

## 2017-08-18 DIAGNOSIS — I10 Essential (primary) hypertension: Secondary | ICD-10-CM | POA: Diagnosis not present

## 2017-08-18 DIAGNOSIS — M5441 Lumbago with sciatica, right side: Secondary | ICD-10-CM | POA: Diagnosis not present

## 2017-08-18 MED ORDER — BACLOFEN 10 MG PO TABS: 5.0000 mg | ORAL_TABLET | Freq: Three times a day (TID) | ORAL | 2 refills | Status: DC | PRN

## 2017-08-18 NOTE — Progress Notes (Signed)
Subjective:    Patient ID: Bruce Richardson, male    DOB: 03-28-1951, 66 y.o.   MRN: [id number]  Bruce Richardson is a 67 y.o. male presenting on 08/18/2017 for Hospitalization Follow-up (stroke, CVA)   HPI  HOSPITAL FOLLOW-UP VISIT  Hospital/Location: [facility] Date of Admission: 08/13/17 Date of Discharge: 08/14/17 Transitions of care telephone call: Completed by Marin Roberts LPN on 78/29/56  Reason for Admission: Weakness, Slurred Speech concern for Stroke Primary (+Secondary) Diagnosis: Acute-Subacute CVA of Left pons (DM2, HLD, HTN)  - Hospital H&P and Discharge Summary have been reviewed - Patient presents today 4 days after recent hospitalization. Brief summary of recent course, patient had symptoms of slurred speech that started about 8 days ago on past Sunday 12/23, he did not seek treatment and it did not improve. He contacted our office on 12/26 and was advised to go to hospital ED for stroke evaluation, he did not endorse weakness, in hospital had CT and MRI confirm CVA, hospitalized, stroke team / neurology consulted, he was taking ASA 81 before stroke, no prior event, and they added Plavix, for DAPT, his lipitor was increased from 40 to 80 and he had testing with ECHO and Carotid Dopplers - Today reports overall has done well after discharge. Symptoms of slurred speech have mostly resolved, now he states only some difficulty with certain sounds such as "P's, S's, T's" and he had some brief episodes of choking at times but was able to swallow, he states he passed the SLP swallow testing in hospital, and is asking why he has some problems with it now. He is not interested in further therapy at this time. He did not have Neurology follow-up on discharge - New medications on discharge: Plavix 75 daily - Changes to current meds on discharge: Atorvastatin 80mg  (instead of 40) - Denies any recurrence of weakness, worsening speech, numbness, tingling, headache, vision changes, chest pain,  dyspnea, fever/chills  Additionally:  CHRONIC HTN: Reports he has "always" had elevated BP 140-150s, his has been difficult to control Current Meds - Lisinopril 40mg    Reports good compliance, took meds today. Tolerating well, w/o complaints. Lifestyle: - Diet: improved diet no salt, eats baked boiled and grilled foods, salads Denies CP, dyspnea, HA, edema, dizziness / lightheadedness   I have reviewed the discharge medication list, and have reconciled the current and discharge medications today.   Current Outpatient Medications:  .  ACCU-CHEK SOFTCLIX LANCETS lancets, 2 (two) times daily. for testing, Disp: , Rfl: 1 .  aspirin EC 81 MG tablet, Take 1 tablet (81 mg total) by mouth daily., Disp: , Rfl:  .  atorvastatin (LIPITOR) 80 MG tablet, Take 1 tablet (80 mg total) by mouth every evening., Disp: 30 tablet, Rfl: 0 .  baclofen (LIORESAL) 10 MG tablet, Take 0.5-1 tablets (5-10 mg total) by mouth 3 (three) times daily as needed for muscle spasms., Disp: 30 each, Rfl: 2 .  clopidogrel (PLAVIX) 75 MG tablet, Take 1 tablet (75 mg total) by mouth daily., Disp: 30 tablet, Rfl: 0 .  clotrimazole-betamethasone (LOTRISONE) cream, Apply 1 application 2 (two) times daily topically. For 2 weeks then stop, Disp: 30 g, Rfl: 0 .  fluticasone (FLONASE) 50 MCG/ACT nasal spray, Place 2 sprays into both nostrils daily. Use for 4-6 weeks then stop and use seasonally or as needed., Disp: 16 g, Rfl: 3 .  glucose blood test strip, Check blood sugar twice daily. Dx.E11.9, Disp: 100 each, Rfl: 12 .  Insulin Pen Needle (PEN NEEDLES)  32G X 4 MM MISC, 1 Device by Does not apply route every 7 (seven) days., Disp: 30 each, Rfl: 1 .  lisinopril (PRINIVIL,ZESTRIL) 40 MG tablet, Take 1 tablet (40 mg total) by mouth daily., Disp: 90 tablet, Rfl: 3 .  metFORMIN (GLUCOPHAGE) 1000 MG tablet, Take 1 tablet (1,000 mg total) by mouth 2 (two) times daily with a meal., Disp: 180 tablet, Rfl: 3 .  ONETOUCH DELICA LANCETS 33G  MISC, 1 each by Does not apply route 2 (two) times daily. Dx: E11.9, Disp: 100 each, Rfl: 11 .  pantoprazole (PROTONIX) 40 MG tablet, Take 1 tablet (40 mg total) by mouth daily before breakfast., Disp: 90 tablet, Rfl: 3  ------------------------------------------------------------------------- Social History   Tobacco Use  . Smoking status: Former Smoker    Packs/day: 1.50    Types: Cigarettes    Start date: 12/19/2001    Last attempt to quit: 07/03/2002    Years since quitting: 15.1  . Smokeless tobacco: Former Neurosurgeon  . Tobacco comment: quit smoking in 06/2001  Substance Use Topics  . Alcohol use: No    Alcohol/week: 0.0 oz  . Drug use: No    Review of Systems Per HPI unless specifically indicated above     Objective:    BP (!) 148/72 (BP Location: Left Arm, Cuff Size: Normal)   Pulse 89   Temp 99.1 F (37.3 C) (Oral)   Resp 16   Ht 5\' 11"  (1.803 m)   Wt 240 lb (108.9 kg)   BMI 33.47 kg/m   Wt Readings from Last 3 Encounters:  08/18/17 240 lb (108.9 kg)  08/13/17 236 lb (107 kg)  03/19/17 239 lb (108.4 kg)    Physical Exam  Constitutional: He is oriented to person, place, and time. He appears well-developed and well-nourished. No distress.  Well-appearing, comfortable, cooperative  HENT:  Head: Normocephalic and atraumatic.  Mouth/Throat: Oropharynx is clear and moist.  Eyes: Conjunctivae are normal. Right eye exhibits no discharge. Left eye exhibits no discharge.  Neck: Normal range of motion. Neck supple. No thyromegaly present.  Cardiovascular: Normal rate, regular rhythm, normal heart sounds and intact distal pulses.  No murmur heard. Pulmonary/Chest: Effort normal and breath sounds normal. No respiratory distress. He has no wheezes. He has no rales.  Musculoskeletal: Normal range of motion. He exhibits no edema.  Upper / Lower Extremities: - Normal muscle tone, strength bilateral upper extremities 5/5, lower extremities 5/5 - Bilateral shoulders, knees,  wrist, ankles without deformity, tenderness, effusion - Normal Gait  Lymphadenopathy:    He has no cervical adenopathy.  Neurological: He is alert and oriented to person, place, and time. No cranial nerve deficit. He exhibits normal muscle tone. Coordination normal.  Distal sensation to light touch intact  Skin: Skin is warm and dry. No rash noted. He is not diaphoretic. No erythema.  Psychiatric: He has a normal mood and affect. His behavior is normal.  Well groomed, good eye contact, normal speech and thoughts  Nursing note and vitals reviewed.     -------------------------------------------------------- CLINICAL DATA:  Follow-up subacute stroke.  EXAM: MRA HEAD WITHOUT CONTRAST  TECHNIQUE: Angiographic images of the Circle of Willis were obtained using MRA technique without intravenous contrast.  COMPARISON:  MRI of the head August 13, 2017 at 1142 hours  FINDINGS: ANTERIOR CIRCULATION: Normal flow related enhancement of the included cervical, petrous, cavernous and supraclinoid internal carotid arteries. Patent anterior communicating artery. Patent anterior and middle cerebral arteries, including distal segments.  No large vessel occlusion, flow  limiting stenosis, aneurysm.  POSTERIOR CIRCULATION: Codominant vertebral artery's Basilar artery is patent, with normal flow related enhancement of the main branch vessels. Patent posterior cerebral arteries. Robust RIGHT posterior communicating artery present.  No large vessel occlusion, flow limiting stenosis,  aneurysm.  ANATOMIC VARIANTS: None.  Source images and MIP images were reviewed.  IMPRESSION: 1. No emergent large vessel occlusion or flow limiting stenosis.   Electronically Signed   By: Awilda Metroourtnay  Bloomer M.D.   On: 08/13/2017 18:22  -------------------------------- CLINICAL DATA:  Slurred speech per family member, funny feeling to the back of the RIGHT-side of his neck, focal neural deficit  of greater than 6 hours suspected stroke, history hypertension and diabetes mellitus  EXAM: CT HEAD WITHOUT CONTRAST  TECHNIQUE: Contiguous axial images were obtained from the base of the skull through the vertex without intravenous contrast. Sagittal and coronal MPR images reconstructed from axial data set.  COMPARISON:  None.  FINDINGS: Brain: Mild generalized atrophy. Normal ventricular morphology. No midline shift or mass effect. Small vessel chronic ischemic changes of deep cerebral white matter. Low-attenuation at anterior LEFT temporal lobe question subtle infarct versus beam hardening artifact. Suspected small deep LEFT pontine infarct. No additional areas of infarction. No intracranial hemorrhage, mass lesion or extra-axial fluid collection.  Vascular: Atherosclerotic calcification of internal carotid arteries bilaterally at skullbase  Skull: Intact  Sinuses/Orbits: Clear  Other: N/A  IMPRESSION: Atrophy with small vessel chronic ischemic changes of deep cerebral white matter.  Probable small LEFT pontine infarct.  Low-attenuation and anterior LEFT temporal lobe either representing subtle infarct or beam hardening artifact.   Electronically Signed   By: Ulyses SouthwardMark  Boles M.D.   On: 08/13/2017 10:37 ------------------------------------  CLINICAL DATA:  Stroke.  EXAM: BILATERAL CAROTID DUPLEX ULTRASOUND  TECHNIQUE: Wallace CullensGray scale imaging, color Doppler and duplex ultrasound were performed of bilateral carotid and vertebral arteries in the neck.  COMPARISON:  None.  FINDINGS: Criteria: Quantification of carotid stenosis is based on velocity parameters that correlate the residual internal carotid diameter with NASCET-based stenosis levels, using the diameter of the distal internal carotid lumen as the denominator for stenosis measurement.  The following velocity measurements were obtained:  RIGHT  ICA:  104 cm/sec  CCA:  112  cm/sec  SYSTOLIC ICA/CCA RATIO:  0.9  DIASTOLIC ICA/CCA RATIO:  2.1  ECA:  112 cm/sec  LEFT  ICA:  102 cm/sec  CCA:  116 cm/sec  SYSTOLIC ICA/CCA RATIO:  0.9  DIASTOLIC ICA/CCA RATIO:  1.6  ECA:  141 cm/sec  RIGHT CAROTID ARTERY: Intimal thickening in the right common carotid artery. Heterogeneous plaque at the right carotid bulb. External carotid artery is patent with normal waveform. Small amount of echogenic plaque in the proximal internal carotid artery. Normal waveforms and velocities in the internal carotid artery.  RIGHT VERTEBRAL ARTERY: Antegrade flow and normal waveform in the right vertebral artery.  LEFT CAROTID ARTERY: Intimal thickening in the left common carotid artery. External carotid artery is patent with normal waveform. Small amount of plaque in the left internal carotid artery. Normal waveforms and velocities in the internal carotid artery.  LEFT VERTEBRAL ARTERY: Antegrade flow and normal waveform in the left vertebral artery.  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 Estimated degree of stenosis in the internal carotid arteries is less than 50% bilaterally.  Patent vertebral arteries with antegrade flow.   Electronically Signed   By: Richarda OverlieAdam  Henn M.D.   On: 08/13/2017 17:49  -----------------------------------------     *[facility]*  [address]                        [place], [place] [address]                            [phone]  ------------------------------------------------------------------- Transthoracic Echocardiography  Patient:    Tanveer, Brammer MR #:       [id number] Study Date: 08/13/2017 Gender:     M Age:        85 Height:     177.8 cm Weight:     107 kg BSA:        2.34 m^2 Pt. Status: Room:       122A   ATTENDING    Salley Scarlet  SONOGRAPHER  Aida Raider, RDCS  ADMITTING    Mody, Sital P  ORDERING      Mody, Sital P  REFERRING    Mody, Sital P  PERFORMING   [facility], [facility]  cc:  ------------------------------------------------------------------- LV EF: 65% -   70%  ------------------------------------------------------------------- Indications:      I163.9 Stroke.  ------------------------------------------------------------------- History:   PMH:  Acquired from the patient and from the patient&'s chart.  Risk factors:  Former tobacco use. Hypertension. Diabetes mellitus. Dyslipidemia.  ------------------------------------------------------------------- Study Conclusions  - Left ventricle: The cavity size was normal. There was moderate   concentric hypertrophy. Systolic function was vigorous. The   estimated ejection fraction was in the range of 65% to 70%. Wall   motion was normal; there were no regional wall motion   abnormalities. Doppler parameters are consistent with abnormal   left ventricular relaxation (grade 1 diastolic dysfunction). - Left atrium: The atrium was mildly dilated.  Impressions:  - No cardiac source of emboli was indentified.  ------------------------------------------------------------------- Study data:   Study status:  Routine.  Procedure:  The patient reported no pain pre or post test. Transthoracic echocardiography for left ventricular function evaluation, for right ventricular function evaluation, and for assessment of valvular function. Image quality was adequate.  Study completion:  There were no complications.          Transthoracic echocardiography.  M-mode, complete 2D, spectral Doppler, and color Doppler.  Birthdate: Patient birthdate: 05-30-1951.  Age:  Patient is 66 yr old.  Sex: Gender: male.    BMI: 33.9 kg/m^2.  Blood pressure:     132/59 Patient status:  Inpatient.  Study date:  Study date: 08/13/2017. Study time: 08:20 PM.  Location:  Echo  laboratory.  -------------------------------------------------------------------  ------------------------------------------------------------------- Left ventricle:  The cavity size was normal. There was moderate concentric hypertrophy. Systolic function was vigorous. The estimated ejection fraction was in the range of 65% to 70%. Wall motion was normal; there were no regional wall motion abnormalities. Doppler parameters are consistent with abnormal left ventricular relaxation (grade 1 diastolic dysfunction).  ------------------------------------------------------------------- Aortic valve:   Trileaflet; normal thickness leaflets. Mobility was not restricted.  Doppler:  Transvalvular velocity was within the normal range. There was no stenosis. There was no regurgitation.   ------------------------------------------------------------------- Aorta:  Aortic root: The aortic root was normal in size.  ------------------------------------------------------------------- Mitral valve:   Structurally normal valve.   Mobility was not restricted.  Doppler:  Transvalvular velocity was within the normal range. There was no evidence for stenosis. There was no regurgitation.  ------------------------------------------------------------------- Left atrium:  The atrium was mildly dilated.  ------------------------------------------------------------------- Right ventricle:  The cavity size was normal. Wall thickness was normal. Systolic  function was normal.  ------------------------------------------------------------------- Pulmonic valve:    Doppler:  Transvalvular velocity was within the normal range. There was no evidence for stenosis.  ------------------------------------------------------------------- Tricuspid valve:   Structurally normal valve.    Doppler: Transvalvular velocity was within the normal range. There was  no regurgitation.  ------------------------------------------------------------------- Pulmonary artery:   The main pulmonary artery was normal-sized. Systolic pressure could not be accurately estimated.  ------------------------------------------------------------------- Right atrium:  The atrium was normal in size.  ------------------------------------------------------------------- Pericardium:  There was no pericardial effusion.  ------------------------------------------------------------------- Systemic veins: Inferior vena cava: Not visualized.  Results for orders placed or performed during the hospital encounter of 08/13/17  Protime-INR  Result Value Ref Range   Prothrombin Time 12.4 11.4 - 15.2 seconds   INR 0.93   APTT  Result Value Ref Range   aPTT 28 24 - 36 seconds  CBC  Result Value Ref Range   WBC 9.0 3.8 - 10.6 K/uL   RBC 5.33 4.40 - 5.90 MIL/uL   Hemoglobin 15.5 13.0 - 18.0 g/dL   HCT 78.247.1 95.640.0 - 21.352.0 %   MCV 88.3 80.0 - 100.0 fL   MCH 29.1 26.0 - 34.0 pg   MCHC 32.9 32.0 - 36.0 g/dL   RDW 08.612.9 57.811.5 - 46.914.5 %   Platelets 252 150 - 440 K/uL  Differential  Result Value Ref Range   Neutrophils Relative % 57 %   Neutro Abs 5.2 1.4 - 6.5 K/uL   Lymphocytes Relative 28 %   Lymphs Abs 2.5 1.0 - 3.6 K/uL   Monocytes Relative 10 %   Monocytes Absolute 0.9 0.2 - 1.0 K/uL   Eosinophils Relative 4 %   Eosinophils Absolute 0.4 0 - 0.7 K/uL   Basophils Relative 1 %   Basophils Absolute 0.1 0 - 0.1 K/uL  Comprehensive metabolic panel  Result Value Ref Range   Sodium 135 135 - 145 mmol/L   Potassium 4.0 3.5 - 5.1 mmol/L   Chloride 102 101 - 111 mmol/L   CO2 25 22 - 32 mmol/L   Glucose, Bld 208 (H) 65 - 99 mg/dL   BUN 28 (H) 6 - 20 mg/dL   Creatinine, Ser 6.291.26 (H) 0.61 - 1.24 mg/dL   Calcium 9.5 8.9 - 52.810.3 mg/dL   Total Protein 7.7 6.5 - 8.1 g/dL   Albumin 3.8 3.5 - 5.0 g/dL   AST 19 15 - 41 U/L   ALT 17 17 - 63 U/L   Alkaline Phosphatase 62 38 - 126  U/L   Total Bilirubin 1.1 0.3 - 1.2 mg/dL   GFR calc non Af Amer 57 (L) >60 mL/min   GFR calc Af Amer >60 >60 mL/min   Anion gap 8 5 - 15  Troponin I  Result Value Ref Range   Troponin I <0.03 <0.03 ng/mL  Lipid panel  Result Value Ref Range   Cholesterol 170 0 - 200 mg/dL   Triglycerides 413115 <244<150 mg/dL   HDL 28 (L) >01>40 mg/dL   Total CHOL/HDL Ratio 6.1 RATIO   VLDL 23 0 - 40 mg/dL   LDL Cholesterol 027119 (H) 0 - 99 mg/dL  Hemoglobin O5DA1c  Result Value Ref Range   Hgb A1c MFr Bld 7.7 (H) 4.8 - 5.6 %   Mean Plasma Glucose 174.29 mg/dL  Glucose, capillary  Result Value Ref Range   Glucose-Capillary 125 (H) 65 - 99 mg/dL  Glucose, capillary  Result Value Ref Range   Glucose-Capillary 151 (H) 65 - 99 mg/dL  Glucose, capillary  Result Value Ref Range   Glucose-Capillary 174 (H) 65 - 99 mg/dL  ECHOCARDIOGRAM COMPLETE  Result Value Ref Range   Weight 3,776 oz   Height 70 in   BP 132/59 mmHg      Assessment & Plan:   Problem List Items Addressed This Visit    Degenerative joint disease (DJD) of lumbar spine    Discontinued Naproxen NSAIDs s/p CVA and on DAPT Refill Baclofen PRN      Relevant Medications   baclofen (LIORESAL) 10 MG tablet   History of cerebrovascular accident (CVA) with residual deficit - Primary    Significantly improved nearly resolved residual deficits s/p acute-subacute CVA L pons within 1 week Work-up carotids/echo unremarkable in hospital Now only minor residual speech difficulty certain syllables and infrequent dysphagia, no other weakness Has declined further PT/SLP ASA 81 failure prior to CVA - now taking DAPT with ASA 81 + Plavix 75 On higher dose statin lipitor 80 instead of 40 now Discussed improved BP control goals, for now within 1 week leave BP, continue lisinopril Follow-up within 1 month monitor BP, DM, and adjust HTN meds if need, will renew Plavix for continue indefinite DAPT since ASA failure. If not back to baseline neuro then will  consider referral to Neuro for 2nd opinion      Hypertension    Mildly elevated initial BP, repeat manual check improved to 140s, still not at goal, however now within 1 week of CVA, will not dramatically reduce at this time. - Home BP readings had been similar to improved 130-150  S/p CVA  Plan:  1. Continue current BP regimen - Lisinopril 40mg  daily 2. Encourage improved lifestyle - low sodium diet, regular exercise 3. Continue monitor BP outside office, bring readings to next visit, if persistently >140/90 or new symptoms notify office sooner 4. Follow-up within 1 month as scheduled 2/1 re-check BP, if still elevated consider adding 2nd agent       Other Visit Diagnoses    Acute right-sided low back pain with right-sided sciatica       Relevant Medications   baclofen (LIORESAL) 10 MG tablet       Meds ordered this encounter  Medications  . baclofen (LIORESAL) 10 MG tablet    Sig: Take 0.5-1 tablets (5-10 mg total) by mouth 3 (three) times daily as needed for muscle spasms.    Dispense:  30 each    Refill:  2    Follow up plan: Return in about 5 weeks (around 09/19/2017) for HTN, DM A1c, follow-up CVA.  Saralyn Pilar, DO [facility] 08/18/2017, 9:02 AM

## 2017-08-18 NOTE — Assessment & Plan Note (Signed)
Significantly improved nearly resolved residual deficits s/p acute-subacute CVA L pons within 1 week Work-up carotids/echo unremarkable in hospital Now only minor residual speech difficulty certain syllables and infrequent dysphagia, no other weakness Has declined further PT/SLP ASA 81 failure prior to CVA - now taking DAPT with ASA 81 + Plavix 75 On higher dose statin lipitor 80 instead of 40 now Discussed improved BP control goals, for now within 1 week leave BP, continue lisinopril Follow-up within 1 month monitor BP, DM, and adjust HTN meds if need, will renew Plavix for continue indefinite DAPT since ASA failure. If not back to baseline neuro then will consider referral to Neuro for 2nd opinion

## 2017-08-18 NOTE — Assessment & Plan Note (Signed)
Discontinued Naproxen NSAIDs s/p CVA and on DAPT Refill Baclofen PRN

## 2017-08-18 NOTE — Assessment & Plan Note (Signed)
Mildly elevated initial BP, repeat manual check improved to 140s, still not at goal, however now within 1 week of CVA, will not dramatically reduce at this time. - Home BP readings had been similar to improved 130-150  S/p CVA  Plan:  1. Continue current BP regimen - Lisinopril 40mg  daily 2. Encourage improved lifestyle - low sodium diet, regular exercise 3. Continue monitor BP outside office, bring readings to next visit, if persistently >140/90 or new symptoms notify office sooner 4. Follow-up within 1 month as scheduled 2/1 re-check BP, if still elevated consider adding 2nd agent

## 2017-08-18 NOTE — Patient Instructions (Addendum)
Thank you for coming to the office today.  1. It sounds like you are doing well after your stroke. Hopefully the last residual deficits will improve with speech and swallowing  2. CONTINUE Aspirin 81 mg and Plavix 75mg  daily - do not take full dose ASA 325 at this time.  If needed we can contact a Neurologist for a follow-up to discuss  Contact me for refill on Plavix if need  Continue higher dose Statin medicine 80mg   Don't take Ibuprofen, Naproxen. Aleve  Start taking Baclofen (Lioresal) 10mg  (muscle relaxant) - start with half (cut) to one whole pill at night as needed for next 1-3 nights (may make you drowsy, caution with driving) see how it affects you, then if tolerated increase to one pill 2 to 3 times a day or (every 8 hours as needed)  Please schedule a Follow-up Appointment to: Return in about 5 weeks (around 09/19/2017) for HTN, DM A1c, follow-up CVA.    If you have any other questions or concerns, please feel free to call the office or send a message through MyChart. You may also schedule an earlier appointment if necessary.  Additionally, you may be receiving a survey about your experience at our office within a few days to 1 week by e-mail or mail. We value your feedback.  Saralyn PilarAlexander Ohm Dentler, DO [facility]

## 2017-09-19 ENCOUNTER — Encounter: Payer: Self-pay | Admitting: Family Medicine

## 2017-09-19 ENCOUNTER — Ambulatory Visit (INDEPENDENT_AMBULATORY_CARE_PROVIDER_SITE_OTHER): Payer: Medicare HMO | Admitting: Family Medicine

## 2017-09-19 ENCOUNTER — Other Ambulatory Visit: Payer: Self-pay | Admitting: Family Medicine

## 2017-09-19 VITALS — BP 148/80 | HR 88 | Temp 98.8°F | Resp 16 | Ht 71.0 in | Wt 242.0 lb

## 2017-09-19 DIAGNOSIS — Z0000 Encounter for general adult medical examination without abnormal findings: Secondary | ICD-10-CM

## 2017-09-19 DIAGNOSIS — I10 Essential (primary) hypertension: Secondary | ICD-10-CM

## 2017-09-19 DIAGNOSIS — E1169 Type 2 diabetes mellitus with other specified complication: Secondary | ICD-10-CM

## 2017-09-19 DIAGNOSIS — I6930 Unspecified sequelae of cerebral infarction: Secondary | ICD-10-CM

## 2017-09-19 DIAGNOSIS — E785 Hyperlipidemia, unspecified: Secondary | ICD-10-CM

## 2017-09-19 DIAGNOSIS — Z8673 Personal history of transient ischemic attack (TIA), and cerebral infarction without residual deficits: Secondary | ICD-10-CM

## 2017-09-19 DIAGNOSIS — N400 Enlarged prostate without lower urinary tract symptoms: Secondary | ICD-10-CM

## 2017-09-19 DIAGNOSIS — E119 Type 2 diabetes mellitus without complications: Secondary | ICD-10-CM

## 2017-09-19 DIAGNOSIS — Z125 Encounter for screening for malignant neoplasm of prostate: Secondary | ICD-10-CM

## 2017-09-19 MED ORDER — CLOPIDOGREL BISULFATE 75 MG PO TABS: 75.0000 mg | ORAL_TABLET | Freq: Every day | ORAL | 0 refills | Status: DC

## 2017-09-19 NOTE — Assessment & Plan Note (Signed)
Now seems resolved, no further speech deficits No further residual deficits s/p acute-subacute CVA L pons 07/2017 [facility]  Plan 1. Continue Atorv 80mg  (x 2 of 40mg  tabs) - notify office when due for refill 80 - he declined today 2. Discussion on CVA - he is not convinced he had a stroke despite imaging - he did not want to follow-up with Neurology outpatient due to cost. He ran out of Plavix and did not plan on resuming it and did not recall our conversation last time that I recommended to continue indefinitely - he agrees for 30 more days, total 60 day coverage post CVA - on DAPT ASA 81 and Plavix 75mg  - re ordered 30 day supply - I also advised he may have been considered ASA failure since was on at time of stroke and he disagrees ASA 81 failure prior to CVA - now taking DAPT with ASA 81 + Plavix 75 Discussed improved BP control goals - still elevated, declines change - will reconsider

## 2017-09-19 NOTE — Patient Instructions (Addendum)
Thank you for coming to the office today.  1.  Continue Plavix 75mg  daily for 1 more month then stop - and continue baby Aspirin 81mg   Let me know if run out of Atorvastatin 40mg  x2 a day - then if need let me know and we can refill the 80mg  once daily   DUE for FASTING BLOOD WORK (no food or drink after midnight before the lab appointment, only water or coffee without cream/sugar on the morning of)  SCHEDULE "Lab Only" visit in the morning at the clinic for lab draw in 5 MONTHS   - Make sure Lab Only appointment is at about 1 week before your next appointment, so that results will be available  For Lab Results, once available within 2-3 days of blood draw, you can can log in to MyChart online to view your results and a brief explanation. Also, we can discuss results at next follow-up visit.   Please schedule a Follow-up Appointment to: Return in about 5 months (around 02/16/2018) for Annual Physical.    If you have any other questions or concerns, please feel free to call the office or send a message through MyChart. You may also schedule an earlier appointment if necessary.  Additionally, you may be receiving a survey about your experience at our office within a few days to 1 week by e-mail or mail. We value your feedback.  Saralyn PilarAlexander Zakhai Meisinger, DO [facility]

## 2017-09-19 NOTE — Progress Notes (Signed)
Subjective:    Patient ID: Bruce Richardson, male    DOB: 04-14-1951, 67 y.o.   MRN: [id number]  Bruce Richardson is a 67 y.o. male presenting on 09/19/2017 for Hypertension   HPI   Follow-up History of CVA with residual deficit - Last visit with me 08/18/17, for initial visit for same problem after hospitalization for CVA, treated with continued Plavix 75mg  in addition to his existing ASA 81mg  thought to be possible ASA failure since had stroke, patient still had deficit with some slurred speech otherwise no significant weakness, see prior notes for background information. - Interval update with patient had declined any HH PT/OT/SLP  Services after discharge, also he declined outpatient Neurology follow-up - Today patient reports that he has run out of Plavix, states "they only gave me 30 days" at the hospital, he did not recall that we were going to discuss how long to continue this med and I had recommended to take it for longer, possibly indefinitely as DAPT with ASA. He is not convinced that he has even had a stroke. He says that his speech is normal now, no further deficits - He was also given Atorvastatin 80mg  from hospital, but he was told he can continue doubling up the 40mg  tabs two per dose for now, declines new rx on 80mg  - Denies any new CVA symptoms or deficits, no recurrence ,no weakness, numbness tingling, bleeding  CHRONIC HTN: Reports check home BP 130-140. He states it is normal for him at home, but elevated here. He is frustrated with BP control and coming to the doctor Current Meds - Lisinopril 40mg  daily - he asks "why doctors don't tell you about the poison in this medicine" he heard that it was slowly "poisoning your kidneys", he was not mistaken about Losartan Reports good compliance, took meds today. Tolerating well, w/o complaints. Lifestyle: - Diet: eating mostly fish, chicken Denies CP, dyspnea, HA, edema, dizziness / lightheadedness  CHRONIC DM, Type 2: Reports no  concerns, last A1c 7.6 in hospital 08/13/17, he states he was asked to return for blood work today, however it is too soon to check A1c, he does not want fingersticks only wants all blood drawn at once CBGs: No readings here today Meds: Metformin 1000mg  BID Reports good compliance. Tolerating well w/o side-effects Currently on ACEi He declines DM Eye Exam due to cost / financial, says it is not covered he cannot afford this, has cataracts that he cannot get taken care of Denies hypoglycemia, polyuria, visual changes, numbness or tingling.  Health Maintenance: Declines pneumonia vaccine despite counseling.  Depression screen [facility] 2/9 02/04/2017 11/14/2016 08/04/2015  Decreased Interest 2 0 0  Down, Depressed, Hopeless 3 0 0  PHQ - 2 Score 5 0 0  Altered sleeping 3 - -  Tired, decreased energy 2 - -  Change in appetite 2 - -  Feeling bad or failure about yourself  3 - -  Trouble concentrating 2 - -  Moving slowly or fidgety/restless 0 - -  Suicidal thoughts 1 - -  PHQ-9 Score 18 - -  Difficult doing work/chores Somewhat difficult - -    Social History   Tobacco Use  . Smoking status: Former Smoker    Packs/day: 1.50    Types: Cigarettes    Start date: 12/19/2001    Last attempt to quit: 07/03/2002    Years since quitting: 15.2  . Smokeless tobacco: Former Neurosurgeon  . Tobacco comment: quit smoking in 06/2001  Substance Use  Topics  . Alcohol use: No    Alcohol/week: 0.0 oz  . Drug use: No    Review of Systems Per HPI unless specifically indicated above     Objective:    BP (!) 148/80 (BP Location: Left Arm, Cuff Size: Normal)   Pulse 88   Temp 98.8 F (37.1 C) (Oral)   Resp 16   Ht 5\' 11"  (1.803 m)   Wt 242 lb (109.8 kg)   BMI 33.75 kg/m   Wt Readings from Last 3 Encounters:  09/19/17 242 lb (109.8 kg)  08/18/17 240 lb (108.9 kg)  08/13/17 236 lb (107 kg)    Physical Exam  Constitutional: He is oriented to person, place, and time. He appears well-developed and  well-nourished. No distress.  Well-appearing, comfortable  HENT:  Head: Normocephalic and atraumatic.  Mouth/Throat: Oropharynx is clear and moist.  Eyes: Conjunctivae are normal. Right eye exhibits no discharge. Left eye exhibits no discharge.  Neck: Normal range of motion. Neck supple.  Cardiovascular: Normal rate, regular rhythm, normal heart sounds and intact distal pulses.  No murmur heard. Pulmonary/Chest: Effort normal and breath sounds normal. No respiratory distress. He has no wheezes. He has no rales.  Musculoskeletal: Normal range of motion. He exhibits no edema.  Upper / Lower Extremities: - Normal muscle tone, strength bilateral upper extremities 5/5, lower extremities 5/5 - Normal Gait  Neurological: He is alert and oriented to person, place, and time.  Distal sensation to light touch intact  Speech is normal without any slurring or deficits  Skin: Skin is warm and dry. No rash noted. He is not diaphoretic. No erythema.  Psychiatric: He has a normal mood and affect. His behavior is normal.  Well groomed, good eye contact, normal speech and thoughts. Not anxious but appears frustrated at times, similar to previous visits.  Nursing note and vitals reviewed.  Results for orders placed or performed during the hospital encounter of 08/13/17  Protime-INR  Result Value Ref Range   Prothrombin Time 12.4 11.4 - 15.2 seconds   INR 0.93   APTT  Result Value Ref Range   aPTT 28 24 - 36 seconds  CBC  Result Value Ref Range   WBC 9.0 3.8 - 10.6 K/uL   RBC 5.33 4.40 - 5.90 MIL/uL   Hemoglobin 15.5 13.0 - 18.0 g/dL   HCT 16.1 09.6 - 04.5 %   MCV 88.3 80.0 - 100.0 fL   MCH 29.1 26.0 - 34.0 pg   MCHC 32.9 32.0 - 36.0 g/dL   RDW 40.9 81.1 - 91.4 %   Platelets 252 150 - 440 K/uL  Differential  Result Value Ref Range   Neutrophils Relative % 57 %   Neutro Abs 5.2 1.4 - 6.5 K/uL   Lymphocytes Relative 28 %   Lymphs Abs 2.5 1.0 - 3.6 K/uL   Monocytes Relative 10 %   Monocytes  Absolute 0.9 0.2 - 1.0 K/uL   Eosinophils Relative 4 %   Eosinophils Absolute 0.4 0 - 0.7 K/uL   Basophils Relative 1 %   Basophils Absolute 0.1 0 - 0.1 K/uL  Comprehensive metabolic panel  Result Value Ref Range   Sodium 135 135 - 145 mmol/L   Potassium 4.0 3.5 - 5.1 mmol/L   Chloride 102 101 - 111 mmol/L   CO2 25 22 - 32 mmol/L   Glucose, Bld 208 (H) 65 - 99 mg/dL   BUN 28 (H) 6 - 20 mg/dL   Creatinine, Ser 7.82 (H) 0.61 -  1.24 mg/dL   Calcium 9.5 8.9 - 28.4 mg/dL   Total Protein 7.7 6.5 - 8.1 g/dL   Albumin 3.8 3.5 - 5.0 g/dL   AST 19 15 - 41 U/L   ALT 17 17 - 63 U/L   Alkaline Phosphatase 62 38 - 126 U/L   Total Bilirubin 1.1 0.3 - 1.2 mg/dL   GFR calc non Af Amer 57 (L) >60 mL/min   GFR calc Af Amer >60 >60 mL/min   Anion gap 8 5 - 15  Troponin I  Result Value Ref Range   Troponin I <0.03 <0.03 ng/mL  Lipid panel  Result Value Ref Range   Cholesterol 170 0 - 200 mg/dL   Triglycerides 132 <440 mg/dL   HDL 28 (L) >10 mg/dL   Total CHOL/HDL Ratio 6.1 RATIO   VLDL 23 0 - 40 mg/dL   LDL Cholesterol 272 (H) 0 - 99 mg/dL  Hemoglobin Z3G  Result Value Ref Range   Hgb A1c MFr Bld 7.7 (H) 4.8 - 5.6 %   Mean Plasma Glucose 174.29 mg/dL  Glucose, capillary  Result Value Ref Range   Glucose-Capillary 125 (H) 65 - 99 mg/dL  Glucose, capillary  Result Value Ref Range   Glucose-Capillary 151 (H) 65 - 99 mg/dL  Glucose, capillary  Result Value Ref Range   Glucose-Capillary 174 (H) 65 - 99 mg/dL  ECHOCARDIOGRAM COMPLETE  Result Value Ref Range   Weight 3,776 oz   Height 70 in   BP 132/59 mmHg      Assessment & Plan:   Problem List Items Addressed This Visit    History of CVA (cerebrovascular accident) without residual deficits - Primary    Now seems resolved, no further speech deficits No further residual deficits s/p acute-subacute CVA L pons 07/2017 Has declined all/any [facility] OT/PT/SLP  Plan 1. Continue Atorv 80mg  (x 2 of 40mg  tabs) - notify office when due for  refill 80 - he declined today 2. Discussion on CVA - he is not convinced he had a stroke despite imaging - he did not want to follow-up with Neurology outpatient due to cost. He ran out of Plavix and did not plan on resuming it and did not recall our conversation last time that I recommended to continue indefinitely - he agrees for 30 more days, total 60 day coverage post CVA - on DAPT ASA 81 and Plavix 75mg  - re ordered 30 day supply - I also advised he may have been considered ASA failure since was on at time of stroke and he disagrees ASA 81 failure prior to CVA - now taking DAPT with ASA 81 + Plavix 75 Discussed improved BP control goals - still elevated, declines change - will reconsider      Relevant Medications   clopidogrel (PLAVIX) 75 MG tablet   Hyperlipidemia due to type 2 diabetes mellitus (HCC)    Elevated LDL cholesterol last time in hospital 07/2017 Elevated ASCVD 10 yr risk score with Diabetes Complication CVA  Plan: 1. Continue current meds - Atorvastatin 80mg  daily - taking TWO of 40mg  tabs - notify office when he runs low and we can re-order the 80mg  tabs, he declined rx today 2. Continue ASA 81mg  for primary ASCVD risk reduction - also on Plavix now for DAPT 30 more days 3. Encourage improved lifestyle - low carb/cholesterol, reduce portion size, continue improving regular exercise 4. Follow-up 5 months annual phys + labs      Hypertension    Mildly elevated initial  BP, repeat manual check improved. He is frustrated with higher readings here compared to home, declines any changes at this time - Home BP readings had been similar to improved 130-150  S/p CVA  Plan:  1. Continue current BP regimen - Lisinopril 40mg  daily - discussed that this medicine is not toxic to healthy kidneys, it helps reduce risk of kidney problems, but if kidneys already damaged it can be harsh on kidneys, also advised he may be thinking of Losartan recall 2. Encourage improved lifestyle - low  sodium diet, regular exercise 3. Continue monitor BP outside office, bring readings to next visit, if persistently >140/90 or new symptoms notify office sooner 4. Follow-up 5 months - he has declined adding other agent for BP - may rediscuss in future         Meds ordered this encounter  Medications  . clopidogrel (PLAVIX) 75 MG tablet    Sig: Take 1 tablet (75 mg total) by mouth daily.    Dispense:  30 tablet    Refill:  0    Follow up plan: Return in about 5 months (around 02/16/2018) for Annual Physical.   Future orders placed for labs 5 months, 02/16/18  Saralyn PilarAlexander Shenna Brissette, DO [facility] 09/19/2017, 9:36 AM

## 2017-09-19 NOTE — Assessment & Plan Note (Signed)
Mildly elevated initial BP, repeat manual check improved. He is frustrated with higher readings here compared to home, declines any changes at this time - Home BP readings had been similar to improved 130-150  S/p CVA  Plan:  1. Continue current BP regimen - Lisinopril 40mg  daily - discussed that this medicine is not toxic to healthy kidneys, it helps reduce risk of kidney problems, but if kidneys already damaged it can be harsh on kidneys, also advised he may be thinking of Losartan recall 2. Encourage improved lifestyle - low sodium diet, regular exercise 3. Continue monitor BP outside office, bring readings to next visit, if persistently >140/90 or new symptoms notify office sooner 4. Follow-up 5 months - he has declined adding other agent for BP - may rediscuss in future

## 2017-09-19 NOTE — Assessment & Plan Note (Addendum)
Elevated LDL cholesterol last time in hospital 07/2017 Elevated ASCVD 10 yr risk score with Diabetes Complication CVA  Plan: 1. Continue current meds - Atorvastatin 80mg  daily - taking TWO of 40mg  tabs - notify office when he runs low and we can re-order the 80mg  tabs, he declined rx today 2. Continue ASA 81mg  for primary ASCVD risk reduction - also on Plavix now for DAPT 30 more days 3. Encourage improved lifestyle - low carb/cholesterol, reduce portion size, continue improving regular exercise 4. Follow-up 5 months annual phys + labs

## 2017-10-16 ENCOUNTER — Other Ambulatory Visit: Payer: Self-pay

## 2017-10-16 DIAGNOSIS — E785 Hyperlipidemia, unspecified: Principal | ICD-10-CM

## 2017-10-16 DIAGNOSIS — E1169 Type 2 diabetes mellitus with other specified complication: Secondary | ICD-10-CM

## 2017-10-16 MED ORDER — ATORVASTATIN CALCIUM 80 MG PO TABS: 80.0000 mg | ORAL_TABLET | Freq: Every evening | ORAL | 0 refills | Status: DC

## 2017-10-17 ENCOUNTER — Other Ambulatory Visit: Payer: Self-pay

## 2017-10-17 DIAGNOSIS — E785 Hyperlipidemia, unspecified: Principal | ICD-10-CM

## 2017-10-17 DIAGNOSIS — E1169 Type 2 diabetes mellitus with other specified complication: Secondary | ICD-10-CM

## 2017-10-17 MED ORDER — ATORVASTATIN CALCIUM 80 MG PO TABS: 80.0000 mg | ORAL_TABLET | Freq: Every evening | ORAL | 1 refills | Status: DC

## 2017-11-26 DIAGNOSIS — H25811 Combined forms of age-related cataract, right eye: Secondary | ICD-10-CM | POA: Diagnosis not present

## 2017-11-26 DIAGNOSIS — H353131 Nonexudative age-related macular degeneration, bilateral, early dry stage: Secondary | ICD-10-CM | POA: Diagnosis not present

## 2017-11-26 DIAGNOSIS — Z01818 Encounter for other preprocedural examination: Secondary | ICD-10-CM | POA: Diagnosis not present

## 2017-12-12 ENCOUNTER — Telehealth: Payer: Self-pay

## 2017-12-12 DIAGNOSIS — E119 Type 2 diabetes mellitus without complications: Secondary | ICD-10-CM

## 2017-12-12 DIAGNOSIS — R69 Illness, unspecified: Secondary | ICD-10-CM | POA: Diagnosis not present

## 2017-12-12 MED ORDER — ACCU-CHEK SOFTCLIX LANCETS MISC: 100.0000 | Freq: Two times a day (BID) | 1 refills | Status: DC

## 2017-12-12 MED ORDER — ONETOUCH ULTRA MINI W/DEVICE KIT: 1.0000 | PACK | 0 refills | Status: DC

## 2017-12-12 MED ORDER — GLUCOSE BLOOD VI STRP: ORAL_STRIP | 12 refills | Status: DC

## 2017-12-12 NOTE — Telephone Encounter (Signed)
Patient came by office and is requesting a new OneTouch Ultra Mini test kit.  He test twice a day and would like it to [place].

## 2017-12-15 DIAGNOSIS — R69 Illness, unspecified: Secondary | ICD-10-CM | POA: Diagnosis not present

## 2018-01-01 DIAGNOSIS — H2511 Age-related nuclear cataract, right eye: Secondary | ICD-10-CM | POA: Diagnosis not present

## 2018-01-01 DIAGNOSIS — H25811 Combined forms of age-related cataract, right eye: Secondary | ICD-10-CM | POA: Diagnosis not present

## 2018-01-08 ENCOUNTER — Other Ambulatory Visit: Payer: Self-pay | Admitting: Family Medicine

## 2018-01-08 DIAGNOSIS — I10 Essential (primary) hypertension: Secondary | ICD-10-CM

## 2018-01-08 DIAGNOSIS — E1165 Type 2 diabetes mellitus with hyperglycemia: Principal | ICD-10-CM

## 2018-01-08 DIAGNOSIS — K219 Gastro-esophageal reflux disease without esophagitis: Secondary | ICD-10-CM

## 2018-01-08 DIAGNOSIS — IMO0001 Reserved for inherently not codable concepts without codable children: Secondary | ICD-10-CM

## 2018-01-09 DIAGNOSIS — H2511 Age-related nuclear cataract, right eye: Secondary | ICD-10-CM | POA: Diagnosis not present

## 2018-01-19 ENCOUNTER — Telehealth: Payer: Self-pay | Admitting: Family Medicine

## 2018-01-19 NOTE — Telephone Encounter (Signed)
/  LM regarding  need to schedule MWV w [facility] on 02/10/18.  His last AWV was 02/04/17 and his physical is scheduled for 02/16/2018

## 2018-01-20 NOTE — Telephone Encounter (Signed)
[facility] scheduled

## 2018-02-10 ENCOUNTER — Ambulatory Visit (INDEPENDENT_AMBULATORY_CARE_PROVIDER_SITE_OTHER): Payer: Medicare HMO

## 2018-02-10 ENCOUNTER — Other Ambulatory Visit: Payer: Self-pay

## 2018-02-10 VITALS — BP 120/82 | HR 82 | Resp 17 | Ht 71.0 in | Wt 231.6 lb

## 2018-02-10 DIAGNOSIS — E119 Type 2 diabetes mellitus without complications: Secondary | ICD-10-CM | POA: Diagnosis not present

## 2018-02-10 DIAGNOSIS — I10 Essential (primary) hypertension: Secondary | ICD-10-CM | POA: Diagnosis not present

## 2018-02-10 DIAGNOSIS — Z125 Encounter for screening for malignant neoplasm of prostate: Secondary | ICD-10-CM | POA: Diagnosis not present

## 2018-02-10 DIAGNOSIS — Z0000 Encounter for general adult medical examination without abnormal findings: Secondary | ICD-10-CM

## 2018-02-10 DIAGNOSIS — E785 Hyperlipidemia, unspecified: Secondary | ICD-10-CM

## 2018-02-10 DIAGNOSIS — E1169 Type 2 diabetes mellitus with other specified complication: Secondary | ICD-10-CM

## 2018-02-10 DIAGNOSIS — I6930 Unspecified sequelae of cerebral infarction: Secondary | ICD-10-CM | POA: Diagnosis not present

## 2018-02-10 NOTE — Progress Notes (Signed)
Subjective:   Bruce Richardson is a 67 y.o. male who presents for Medicare Annual/Subsequent preventive examination.   Review of Systems:    Cardiac Risk Factors include: advanced age (>32mn, >>11women);dyslipidemia;hypertension;smoking/ tobacco exposure;obesity (BMI >30kg/m2)     Objective:    Vitals: BP 120/82 (BP Location: Left Arm, Patient Position: Sitting)   Pulse 82   Resp 17   Ht 5' 11" (1.803 m)   Wt 231 lb 9.6 oz (105.1 kg)   BMI 32.30 kg/m   Body mass index is 32.3 kg/m.  Advanced Directives 02/10/2018 08/13/2017 08/13/2017 02/04/2017 08/04/2015 07/24/2015 07/18/2015  Does Patient Have a Medical Advance Directive? Yes Yes No No Yes Yes Yes  Type of Advance Directive Living will;Healthcare Power of Attorney Living will - - [place]  Does patient want to make changes to medical advance directive? - No - Patient declined - - No - Patient declined No - Patient declined No - Patient declined  Copy of HF[place]ace [place] Chart? No - copy requested - - - No - copy requested No - copy requested No - copy requested  Would patient like information on creating a medical advance directive? - - - Yes (MAU/Ambulatory/Procedural Areas - Information given) No - patient declined information No - patient declined information -  Some encounter information is confidential and restricted. Go to Review Flowsheets activity to see all data.    Tobacco Social History   Tobacco Use  Smoking Status Former Smoker  . Packs/day: 1.50  . Types: Cigarettes  . Start date: 12/19/2001  . Last attempt to quit: 07/03/2002  . Years since quitting: 15.6  Smokeless Tobacco Former USystems developer Tobacco Comment   quit smoking in 06/2001     Counseling given: Not Answered Comment: quit smoking in 06/2001   Clinical Intake:  Pre-visit preparation completed: Yes  Pain : No/denies  pain     Nutritional Status: BMI > 30  Obese Nutritional Risks: None Diabetes: No  How often do you need to have someone help you when you read instructions, pamphlets, or other written materials from your doctor or pharmacy?: 1 - Never  Interpreter Needed?: No  Information entered by :: Tiffany Hill,LPN   Past Medical History:  Diagnosis Date  . Arthritis   . Bulging lumbar disc    lower back  . Complication of anesthesia    kept moving while under anesthesia during colonoscopy, hard to put under.  . Diabetes mellitus without complication ([place]   . GERD (gastroesophageal reflux disease)   . Hyperlipidemia   . Hypertension    Past Surgical History:  Procedure Laterality Date  . CIRCUMCISION N/A 07/24/2015   Procedure: CIRCUMCISION ADULT;  Surgeon: BNickie Retort MD;  Location: [facility] ORS;  Service: Urology;  Laterality: N/A;  . EYE SURGERY Right    right eye cataract surgery   . SHOULDER ARTHROSCOPY Bilateral   . SHOULDER ARTHROSCOPY Right 12/22/2014   Procedure: RIGHT SHOULDER ARTHROSCOPY /DECOMPRESSION/ROTATOR CUFF REPAIR OF RECURRENT ROTATOR CUFF TEAR;  Surgeon: JCorky Mull MD;  Location: [facility] ORS;  Service: Orthopedics;  Laterality: Right;   Family History  Problem Relation Age of Onset  . Alzheimer's disease Mother   . Diabetes Mother   . Hypertension Mother   . Cancer Father   . Heart failure Brother   . Diabetes Brother   . Hypertension Brother   . Heart disease Brother   .  Prostate cancer Other        unknown  . Bladder Cancer Other        unknown  . Heart disease Son   . Diabetes Sister    Social History   Socioeconomic History  . Marital status: Married    Spouse name: Not on file  . Number of children: Not on file  . Years of education: Not on file  . Highest education level: Not on file  Occupational History  . Not on file  Social Needs  . Financial resource strain: Very hard  . Food insecurity:    Worry: Often true    Inability: Often  true  . Transportation needs:    Medical: No    Non-medical: No  Tobacco Use  . Smoking status: Former Smoker    Packs/day: 1.50    Types: Cigarettes    Start date: 12/19/2001    Last attempt to quit: 07/03/2002    Years since quitting: 15.6  . Smokeless tobacco: Former Systems developer  . Tobacco comment: quit smoking in 06/2001  Substance and Sexual Activity  . Alcohol use: No    Alcohol/week: 0.0 oz  . Drug use: No  . Sexual activity: Not on file  Lifestyle  . Physical activity:    Days per week: 0 days    Minutes per session: 0 min  . Stress: Not at all  Relationships  . Social connections:    Talks on phone: More than three times a week    Gets together: More than three times a week    Attends religious service: Never    Active member of club or organization: No    Attends meetings of clubs or organizations: Never    Relationship status: Married  Other Topics Concern  . Not on file  Social History Narrative  . Not on file    Outpatient Encounter Medications as of 02/10/2018  Medication Sig  . ACCU-CHEK SOFTCLIX LANCETS lancets 100 each by Other route 2 (two) times daily. for testing  . aspirin EC 81 MG tablet Take 1 tablet (81 mg total) by mouth daily.  Marland Kitchen atorvastatin (LIPITOR) 80 MG tablet Take 1 tablet (80 mg total) by mouth every evening.  . Blood Glucose Monitoring Suppl (ONE TOUCH ULTRA MINI) w/Device KIT 1 kit by Does not apply route as directed.  . clopidogrel (PLAVIX) 75 MG tablet Take 1 tablet (75 mg total) by mouth daily.  . fluticasone (FLONASE) 50 MCG/ACT nasal spray Place 2 sprays into both nostrils daily. Use for 4-6 weeks then stop and use seasonally or as needed.  Marland Kitchen glucose blood test strip Check blood sugar twice daily. Dx.E11.9  . Insulin Pen Needle (PEN NEEDLES) 32G X 4 MM MISC 1 Device by Does not apply route every 7 (seven) days.  . metFORMIN (GLUCOPHAGE) 1000 MG tablet TAKE 1 TABLET BY MOUTH TWICE DAILY WITH MEALS  . ONETOUCH DELICA LANCETS 94W MISC 1 each  by Does not apply route 2 (two) times daily. Dx: E11.9  . pantoprazole (PROTONIX) 40 MG tablet TAKE 1 TABLET BY MOUTH BEFORE BREAKFAST  . baclofen (LIORESAL) 10 MG tablet Take 0.5-1 tablets (5-10 mg total) by mouth 3 (three) times daily as needed for muscle spasms. (Patient not taking: Reported on 02/10/2018)  . clotrimazole-betamethasone (LOTRISONE) cream Apply 1 application 2 (two) times daily topically. For 2 weeks then stop (Patient not taking: Reported on 02/10/2018)  . lisinopril (PRINIVIL,ZESTRIL) 40 MG tablet TAKE 1 TABLET BY MOUTH ONCE DAILY (Patient  not taking: Reported on 02/10/2018)   No facility-administered encounter medications on file as of 02/10/2018.     Activities of Daily Living In your present state of health, do you have any difficulty performing the following activities: 02/10/2018 08/13/2017  Hearing? Y N  Vision? Y N  Comment has had same glasses for 5 years  -  Difficulty concentrating or making decisions? Y N  Walking or climbing stairs? N N  Dressing or bathing? N N  Doing errands, shopping? N N  Preparing Food and eating ? N -  Using the Toilet? N -  In the past six months, have you accidently leaked urine? N -  Do you have problems with loss of bowel control? N -  Managing your Medications? N -  Managing your Finances? N -  Housekeeping or managing your Housekeeping? N -  Some recent data might be hidden    Patient Care Team: Olin Hauser, DO as PCP - General (Family Medicine) Arlis Porta., MD (Family Medicine)   Assessment:   This is a routine wellness examination for [place].  Exercise Activities and Dietary recommendations Current Exercise Habits: The patient does not participate in regular exercise at present, Exercise limited by: None identified  Goals    . DIET - INCREASE WATER INTAKE     Recommend continue drinking at least 6-8 glasses of water a day     . Exercise     Continue walking.        Fall Risk Fall Risk   02/10/2018 02/04/2017 11/14/2016 08/04/2015 07/04/2015  Falls in the past year? No No Yes No No  Injury with Fall? - - No - -   Is the patient's home free of loose throw rugs in walkways, pet beds, electrical cords, etc?   yes      Grab bars in the bathroom? yes      Handrails on the stairs?   no stairs       Adequate lighting?   yes  Timed Get Up and Go Performed: Completed in 8 seconds with no use of assistive devices, steady gait. No intervention needed at this time.   Depression Screen PHQ 2/9 Scores 02/10/2018 02/04/2017 11/14/2016 08/04/2015  PHQ - 2 Score 3 5 0 0  PHQ- 9 Score 7 18 - -    Cognitive Function MMSE - Mini Mental State Exam 04/12/2015  Orientation to time 5  Orientation to Place 5  Registration 3  Attention/ Calculation 5  Recall 3  Language- name 2 objects 2  Language- repeat 1  Language- follow 3 step command 3  Language- read & follow direction 0  Write a sentence 0  Copy design 0  Total score 27     6CIT Screen 02/10/2018 02/04/2017  What Year? 0 points 0 points  What month? 0 points 0 points  What time? 0 points 0 points  Count back from 20 0 points 0 points  Months in reverse 0 points 0 points  Repeat phrase 0 points 0 points  Total Score 0 0     There is no immunization history on file for this patient.  Qualifies for Shingles Vaccine? Yes, discussed shingrix vaccine  Screening Tests Health Maintenance  Topic Date Due  . OPHTHALMOLOGY EXAM  09/19/2018 (Originally 06/26/2017)  . PNA vac Low Risk Adult (1 of 2 - PCV13) 09/19/2018 (Originally 02/16/2015)  . TETANUS/TDAP  02/11/2019 (Originally 02/15/1970)  . HEMOGLOBIN A1C  02/11/2018  . INFLUENZA VACCINE  03/19/2018  .  FOOT EXAM  03/19/2018  . Fecal DNA (Cologuard)  03/04/2020  . Hepatitis C Screening  Completed   Cancer Screenings: Lung: Low Dose CT Chest recommended if Age 70-80 years, 30 pack-year currently smoking OR have quit w/in 15years. Patient does not qualify. Colorectal: cologuard  completed 03/04/2017  Additional Screenings:  Hepatitis C Screening:completed 02/25/2017      Plan:    I have personally reviewed and addressed the Medicare Annual Wellness questionnaire and have noted the following in the patient's chart:  A. Medical and social history B. Use of alcohol, tobacco or illicit drugs  C. Current medications and supplements D. Functional ability and status E.  Nutritional status F.  Physical activity G. Advance directives H. List of other physicians I.  Hospitalizations, surgeries, and ER visits in previous 12 months J.  [place] such as hearing and vision if needed, cognitive and depression L. Referrals and appointments   In addition, I have reviewed and discussed with patient certain preventive protocols, quality metrics, and best practice recommendations. A written personalized care plan for preventive services as well as general preventive health recommendations were provided to patient.   Signed,  Tyler Aas, LPN Nurse Health Advisor   Nurse Notes:none

## 2018-02-10 NOTE — Patient Instructions (Addendum)
Bruce Richardson , Thank you for taking time to come for your Medicare Wellness Visit. I appreciate your ongoing commitment to your health goals. Please review the following plan we discussed and let me know if I can assist you in the future.   Screening recommendations/referrals: Colonoscopy: cologuard completed 03/04/2017 Recommended yearly ophthalmology/optometry visit for glaucoma screening and checkup Recommended yearly dental visit for hygiene and checkup  Vaccinations: Influenza vaccine: due 04/2018 Pneumococcal vaccine: due now -declined  Tdap vaccine: due, check with your insurance company for coverage- declined Shingles vaccine: due, check with your insurance company for coverage- declined   Advanced directives: Please bring a copy of your health care power of attorney and living will to the office at your convenience.  Conditions/risks identified: Recommend continue drinking at least 6-8 glasses of water a day   Next appointment: Follow up on 02/16/2018 at 9:00am with Dr.Karamalegos. Follow up in one year for your annual wellness exam    Preventive Care 65 Years and Older, Male Preventive care refers to lifestyle choices and visits with your health care provider that can promote health and wellness. What does preventive care include?  A yearly physical exam. This is also called an annual well check.  Dental exams once or twice a year.  Routine eye exams. Ask your health care provider how often you should have your eyes checked.  Personal lifestyle choices, including:  Daily care of your teeth and gums.  Regular physical activity.  Eating a healthy diet.  Avoiding tobacco and drug use.  Limiting alcohol use.  Practicing safe sex.  Taking low doses of aspirin every day.  Taking vitamin and mineral supplements as recommended by your health care provider. What happens during an annual well check? The services and screenings done by your health care provider during your  annual well check will depend on your age, overall health, lifestyle risk factors, and family history of disease. Counseling  Your health care provider may ask you questions about your:  Alcohol use.  Tobacco use.  Drug use.  Emotional well-being.  Home and relationship well-being.  Sexual activity.  Eating habits.  History of falls.  Memory and ability to understand (cognition).  Work and work Astronomerenvironment. Screening  You may have the following tests or measurements:  Height, weight, and BMI.  Blood pressure.  Lipid and cholesterol levels. These may be checked every 5 years, or more frequently if you are over 674 years old.  Skin check.  Lung cancer screening. You may have this screening every year starting at age 655 if you have a 30-pack-year history of smoking and currently smoke or have quit within the past 15 years.  Fecal occult blood test (FOBT) of the stool. You may have this test every year starting at age 68.  Flexible sigmoidoscopy or colonoscopy. You may have a sigmoidoscopy every 5 years or a colonoscopy every 10 years starting at age 68.  Prostate cancer screening. Recommendations will vary depending on your family history and other risks.  Hepatitis C blood test.  Hepatitis B blood test.  Sexually transmitted disease (STD) testing.  Diabetes screening. This is done by checking your blood sugar (glucose) after you have not eaten for a while (fasting). You may have this done every 1-3 years.  Abdominal aortic aneurysm (AAA) screening. You may need this if you are a current or former smoker.  Osteoporosis. You may be screened starting at age 770 if you are at high risk. Talk with your health care provider  about your test results, treatment options, and if necessary, the need for more tests. Vaccines  Your health care provider may recommend certain vaccines, such as:  Influenza vaccine. This is recommended every year.  Tetanus, diphtheria, and  acellular pertussis (Tdap, Td) vaccine. You may need a Td booster every 10 years.  Zoster vaccine. You may need this after age 33.  Pneumococcal 13-valent conjugate (PCV13) vaccine. One dose is recommended after age 45.  Pneumococcal polysaccharide (PPSV23) vaccine. One dose is recommended after age 27. Talk to your health care provider about which screenings and vaccines you need and how often you need them. This information is not intended to replace advice given to you by your health care provider. Make sure you discuss any questions you have with your health care provider. Document Released: 09/01/2015 Document Revised: 04/24/2016 Document Reviewed: 06/06/2015 Elsevier Interactive Patient Education  2017 [place] Prevention in the Home Falls can cause injuries. They can happen to people of all ages. There are many things you can do to make your home safe and to help prevent falls. What can I do on the outside of my home?  Regularly fix the edges of walkways and driveways and fix any cracks.  Remove anything that might make you trip as you walk through a door, such as a raised step or threshold.  Trim any bushes or trees on the path to your home.  Use bright outdoor lighting.  Clear any walking paths of anything that might make someone trip, such as rocks or tools.  Regularly check to see if handrails are loose or broken. Make sure that both sides of any steps have handrails.  Any raised decks and porches should have guardrails on the edges.  Have any leaves, snow, or ice cleared regularly.  Use sand or salt on walking paths during winter.  Clean up any spills in your garage right away. This includes oil or grease spills. What can I do in the bathroom?  Use night lights.  Install grab bars by the toilet and in the tub and shower. Do not use towel bars as grab bars.  Use non-skid mats or decals in the tub or shower.  If you need to sit down in the shower, use  a plastic, non-slip stool.  Keep the floor dry. Clean up any water that spills on the floor as soon as it happens.  Remove soap buildup in the tub or shower regularly.  Attach bath mats securely with double-sided non-slip rug tape.  Do not have throw rugs and other things on the floor that can make you trip. What can I do in the bedroom?  Use night lights.  Make sure that you have a light by your bed that is easy to reach.  Do not use any sheets or blankets that are too big for your bed. They should not hang down onto the floor.  Have a firm chair that has side arms. You can use this for support while you get dressed.  Do not have throw rugs and other things on the floor that can make you trip. What can I do in the kitchen?  Clean up any spills right away.  Avoid walking on wet floors.  Keep items that you use a lot in easy-to-reach places.  If you need to reach something above you, use a strong step stool that has a grab bar.  Keep electrical cords out of the way.  Do not use floor polish or  wax that makes floors slippery. If you must use wax, use non-skid floor wax.  Do not have throw rugs and other things on the floor that can make you trip. What can I do with my stairs?  Do not leave any items on the stairs.  Make sure that there are handrails on both sides of the stairs and use them. Fix handrails that are broken or loose. Make sure that handrails are as long as the stairways.  Check any carpeting to make sure that it is firmly attached to the stairs. Fix any carpet that is loose or worn.  Avoid having throw rugs at the top or bottom of the stairs. If you do have throw rugs, attach them to the floor with carpet tape.  Make sure that you have a light switch at the top of the stairs and the bottom of the stairs. If you do not have them, ask someone to add them for you. What else can I do to help prevent falls?  Wear shoes that:  Do not have high heels.  Have  rubber bottoms.  Are comfortable and fit you well.  Are closed at the toe. Do not wear sandals.  If you use a stepladder:  Make sure that it is fully opened. Do not climb a closed stepladder.  Make sure that both sides of the stepladder are locked into place.  Ask someone to hold it for you, if possible.  Clearly mark and make sure that you can see:  Any grab bars or handrails.  First and last steps.  Where the edge of each step is.  Use tools that help you move around (mobility aids) if they are needed. These include:  Canes.  Walkers.  Scooters.  Crutches.  Turn on the lights when you go into a dark area. Replace any light bulbs as soon as they burn out.  Set up your furniture so you have a clear path. Avoid moving your furniture around.  If any of your floors are uneven, fix them.  If there are any pets around you, be aware of where they are.  Review your medicines with your doctor. Some medicines can make you feel dizzy. This can increase your chance of falling. Ask your doctor what other things that you can do to help prevent falls. This information is not intended to replace advice given to you by your health care provider. Make sure you discuss any questions you have with your health care provider. Document Released: 06/01/2009 Document Revised: 01/11/2016 Document Reviewed: 09/09/2014 Elsevier Interactive Patient Education  2017 [employer].

## 2018-02-11 LAB — COMPLETE METABOLIC PANEL WITH GFR
AG Ratio: 1.3 (calc) (ref 1.0–2.5)
ALT: 17 U/L (ref 9–46)
AST: 13 U/L (ref 10–35)
Albumin: 4.3 g/dL (ref 3.6–5.1)
Alkaline phosphatase (APISO): 83 U/L (ref 40–115)
BUN: 15 mg/dL (ref 7–25)
CO2: 25 mmol/L (ref 20–32)
Calcium: 10 mg/dL (ref 8.6–10.3)
Chloride: 105 mmol/L (ref 98–110)
Creat: 1.04 mg/dL (ref 0.70–1.25)
GFR, Est African American: 86 mL/min/{1.73_m2} (ref >=60)
GFR, Est Non African American: 74 mL/min/{1.73_m2} (ref >=60)
Globulin: 3.3 g/dL (calc) (ref 1.9–3.7)
Glucose, Bld: 150 mg/dL — ABNORMAL HIGH (ref 65–99)
Potassium: 4.4 mmol/L (ref 3.5–5.3)
Sodium: 140 mmol/L (ref 135–146)
Total Bilirubin: 1 mg/dL (ref 0.2–1.2)
Total Protein: 7.6 g/dL (ref 6.1–8.1)

## 2018-02-11 LAB — HEMOGLOBIN A1C
Hgb A1c MFr Bld: 7.6 % of total Hgb — ABNORMAL HIGH (ref <5.7)
Mean Plasma Glucose: 171 (calc)
eAG (mmol/L): 9.5 (calc)

## 2018-02-11 LAB — CBC WITH DIFFERENTIAL/PLATELET
Basophils Absolute: 71 cells/uL (ref 0–200)
Basophils Relative: 0.9 %
Eosinophils Absolute: 292 cells/uL (ref 15–500)
Eosinophils Relative: 3.7 %
HCT: 47.2 % (ref 38.5–50.0)
Hemoglobin: 15.8 g/dL (ref 13.2–17.1)
Lymphs Abs: 2607 cells/uL (ref 850–3900)
MCH: 28.9 pg (ref 27.0–33.0)
MCHC: 33.5 g/dL (ref 32.0–36.0)
MCV: 86.4 fL (ref 80.0–100.0)
MPV: 10.2 fL (ref 7.5–12.5)
Monocytes Relative: 9.8 %
Neutro Abs: 4155 cells/uL (ref 1500–7800)
Neutrophils Relative %: 52.6 %
Platelets: 268 10*3/uL (ref 140–400)
RBC: 5.46 10*6/uL (ref 4.20–5.80)
RDW: 12.4 % (ref 11.0–15.0)
Total Lymphocyte: 33 %
WBC mixed population: 774 cells/uL (ref 200–950)
WBC: 7.9 10*3/uL (ref 3.8–10.8)

## 2018-02-11 LAB — LIPID PANEL
Cholesterol: 135 mg/dL (ref <200)
HDL: 31 mg/dL — ABNORMAL LOW (ref >40)
LDL Cholesterol (Calc): 86 mg/dL (calc)
Non-HDL Cholesterol (Calc): 104 mg/dL (calc) (ref <130)
Total CHOL/HDL Ratio: 4.4 (calc) (ref <5.0)
Triglycerides: 88 mg/dL (ref <150)

## 2018-02-11 LAB — PSA, TOTAL WITH REFLEX TO PSA, FREE: PSA, Total: 0.4 ng/mL (ref <=4.0)

## 2018-02-12 ENCOUNTER — Encounter: Payer: Self-pay | Admitting: Family Medicine

## 2018-02-16 ENCOUNTER — Ambulatory Visit (INDEPENDENT_AMBULATORY_CARE_PROVIDER_SITE_OTHER): Payer: Medicare HMO | Admitting: Family Medicine

## 2018-02-16 ENCOUNTER — Encounter: Payer: Self-pay | Admitting: Family Medicine

## 2018-02-16 VITALS — BP 135/80 | HR 86 | Temp 99.0°F | Resp 16 | Ht 71.0 in | Wt 235.6 lb

## 2018-02-16 DIAGNOSIS — R69 Illness, unspecified: Secondary | ICD-10-CM | POA: Diagnosis not present

## 2018-02-16 DIAGNOSIS — I519 Heart disease, unspecified: Secondary | ICD-10-CM | POA: Insufficient documentation

## 2018-02-16 DIAGNOSIS — Z8673 Personal history of transient ischemic attack (TIA), and cerebral infarction without residual deficits: Secondary | ICD-10-CM

## 2018-02-16 DIAGNOSIS — M545 Low back pain: Secondary | ICD-10-CM

## 2018-02-16 DIAGNOSIS — N529 Male erectile dysfunction, unspecified: Secondary | ICD-10-CM | POA: Diagnosis not present

## 2018-02-16 DIAGNOSIS — Z0000 Encounter for general adult medical examination without abnormal findings: Secondary | ICD-10-CM | POA: Diagnosis not present

## 2018-02-16 DIAGNOSIS — I5032 Chronic diastolic (congestive) heart failure: Secondary | ICD-10-CM

## 2018-02-16 DIAGNOSIS — K219 Gastro-esophageal reflux disease without esophagitis: Secondary | ICD-10-CM | POA: Diagnosis not present

## 2018-02-16 DIAGNOSIS — I5189 Other ill-defined heart diseases: Secondary | ICD-10-CM | POA: Insufficient documentation

## 2018-02-16 DIAGNOSIS — E1169 Type 2 diabetes mellitus with other specified complication: Secondary | ICD-10-CM | POA: Diagnosis not present

## 2018-02-16 DIAGNOSIS — F331 Major depressive disorder, recurrent, moderate: Secondary | ICD-10-CM | POA: Insufficient documentation

## 2018-02-16 DIAGNOSIS — I10 Essential (primary) hypertension: Secondary | ICD-10-CM

## 2018-02-16 DIAGNOSIS — E785 Hyperlipidemia, unspecified: Secondary | ICD-10-CM

## 2018-02-16 DIAGNOSIS — G8929 Other chronic pain: Secondary | ICD-10-CM

## 2018-02-16 HISTORY — DX: Major depressive disorder, recurrent, moderate: F33.1

## 2018-02-16 MED ORDER — SILDENAFIL CITRATE 20 MG PO TABS: ORAL_TABLET | ORAL | 2 refills | Status: DC

## 2018-02-16 NOTE — Assessment & Plan Note (Signed)
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 Trial on generic Sildenafil 20mg  tabs, take 1-5 tabs about 30 min prior to sexual activity, refills provided - printed, may go to [facility], call if need larger # pills or refill 2. Follow-up as needed

## 2018-02-16 NOTE — Patient Instructions (Addendum)
Thank you for coming to the office today.  A1c is controlled  Kidney is improved  Rx Sildenafil - take as needed, call if need more  [facility]  Address: [address], [place], [place] [address]  Phone: [phone]  [facility]   Address: [address], [place], [place] [address] Hours: 8:30AM-6:30PM Phone: [phone]   Please schedule a Follow-up Appointment to: Return in about 6 months (around 08/19/2018) for DM A1c, HTN, Mood PHQ.  If you have any other questions or concerns, please feel free to call the office or send a message through MyChart. You may also schedule an earlier appointment if necessary.  Additionally, you may be receiving a survey about your experience at our office within a few days to 1 week by e-mail or mail. We value your feedback.  Saralyn PilarAlexander Darra Rosa, DO [facility]

## 2018-02-16 NOTE — Assessment & Plan Note (Signed)
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

## 2018-02-16 NOTE — Progress Notes (Signed)
Subjective:    Patient ID: Bruce Richardson, male    DOB: 20-Mar-1951, 67 y.o.   MRN: [id number]  Bruce Richardson is a 67 y.o. male presenting on 02/16/2018 for Annual Exam (Highest 138 and lowest 119)   HPI   Here for Annual Physical and Lab Review  Chronic Pain / Shoulders / Back Chronic history of osteoarthritis and pain in multiple areas including back and shoulders. He takes Tylenol regularly. Has taken Naproxen in past, asking about kidney health. He states Baclofen no longer helping, he stopped taking this since it was ineffective.  Chronic Depression He has long history of mood disorder and depression, he has declined medication management and psychiatry/therapist in past, see prior note for background. Currently not on med still. He declines PHQ questions today, he was asked these recently at [facility] and has improved. No new concerns from him today.  CHRONIC DM, Type 2: Reports no new concerns. He states next visit he would like to use his own fingerstick device instead for A1c CBGs: No cbg logs today Meds: Metformin '1000mg'$  BID Reports good compliance. Tolerating well w/o side-effects Currently on ACEi, ASA, Statin Lifestyle: - Diet (tries to eat balanced diet, limit sugar/carbs)  - Exercise (limited) Declined DM Eye Exam due to cost. Denies hypoglycemia  CHRONIC HTN: Reports no new concerns. He is checking BP at home. Usually good readings. Earlier today 130s/70s. He has higher BP here in office based on our cuff. Current Meds - Lisinopril '40mg'$  daily   Reports good compliance, took meds today. Tolerating well, w/o complaints.  History CVA without residual deficit He continues on Aspirin '81mg'$  - now he is taking this 3 x weekly M-W-F instead of daily. He has remained off Plavix after initial 30 day of DAPT. He takes Atorvastatin daily.  HYPERLIPIDEMIA: - Reports no concerns. Last lipid panel 01/2018, controlled, except mild low HDL - Currently taking Atorvastatin '80mg'$  daily,  tolerating well without side effects or myalgias  Chronic Diastolic Grade 1 Dysfunction Identified on prior ECHO 07/2017, otherwise normal, normal LVEF. Not followed by Cardiology  Erectile Dysfunction: - Reports difficulty with maintaining erection, able to get erection but loses it quickly. Gradually worsening over few years. Has never been evaluated for this problem before and never taken any medications for ED. - Admits to good sexual desire. Does get nocturnal erections.  Health Maintenance:  Declined Pneumonia vaccine - he is due for DM  Depression screen [facility] 2/9 02/10/2018 02/04/2017 11/14/2016  Decreased Interest 0 2 0  Down, Depressed, Hopeless 3 3 0  PHQ - 2 Score 3 5 0  Altered sleeping 3 3 -  Tired, decreased energy 1 2 -  Change in appetite 0 2 -  Feeling bad or failure about yourself  0 3 -  Trouble concentrating 0 2 -  Moving slowly or fidgety/restless 0 0 -  Suicidal thoughts 0 1 -  PHQ-9 Score 7 18 -  Difficult doing work/chores Not difficult at all Somewhat difficult -    Past Medical History:  Diagnosis Date  . Arthritis   . Bulging lumbar disc    lower back  . Complication of anesthesia    kept moving while under anesthesia during colonoscopy, hard to put under.  Marland Kitchen GERD (gastroesophageal reflux disease)   . Hypertension    Past Surgical History:  Procedure Laterality Date  . CIRCUMCISION N/A 07/24/2015   Procedure: CIRCUMCISION ADULT;  Surgeon: Nickie Retort, MD;  Location: [facility] ORS;  Service: Urology;  Laterality: N/A;  . EYE SURGERY Right    right eye cataract surgery   . SHOULDER ARTHROSCOPY Bilateral   . SHOULDER ARTHROSCOPY Right 12/22/2014   Procedure: RIGHT SHOULDER ARTHROSCOPY /DECOMPRESSION/ROTATOR CUFF REPAIR OF RECURRENT ROTATOR CUFF TEAR;  Surgeon: Corky Mull, MD;  Location: [facility] ORS;  Service: Orthopedics;  Laterality: Right;   Social History   Socioeconomic History  . Marital status: Married    Spouse name: Not on file  . Number  of children: Not on file  . Years of education: Not on file  . Highest education level: Not on file  Occupational History  . Not on file  Social Needs  . Financial resource strain: Very hard  . Food insecurity:    Worry: Often true    Inability: Often true  . Transportation needs:    Medical: No    Non-medical: No  Tobacco Use  . Smoking status: Former Smoker    Packs/day: 1.50    Types: Cigarettes    Start date: 12/19/2001    Last attempt to quit: 07/03/2002    Years since quitting: 15.6  . Smokeless tobacco: Former Systems developer  . Tobacco comment: quit smoking in 06/2001  Substance and Sexual Activity  . Alcohol use: No    Alcohol/week: 0.0 oz  . Drug use: No  . Sexual activity: Not on file  Lifestyle  . Physical activity:    Days per week: 0 days    Minutes per session: 0 min  . Stress: Not at all  Relationships  . Social connections:    Talks on phone: More than three times a week    Gets together: More than three times a week    Attends religious service: Never    Active member of club or organization: No    Attends meetings of clubs or organizations: Never    Relationship status: Married  . Intimate partner violence:    Fear of current or ex partner: No    Emotionally abused: No    Physically abused: No    Forced sexual activity: No  Other Topics Concern  . Not on file  Social History Narrative  . Not on file   Family History  Problem Relation Age of Onset  . Alzheimer's disease Mother   . Diabetes Mother   . Hypertension Mother   . Cancer Father   . Heart failure Brother   . Diabetes Brother   . Hypertension Brother   . Heart disease Brother   . Prostate cancer Other        unknown  . Bladder Cancer Other        unknown  . Heart disease Son   . Diabetes Sister    Current Outpatient Medications on File Prior to Visit  Medication Sig  . ACCU-CHEK SOFTCLIX LANCETS lancets 100 each by Other route 2 (two) times daily. for testing  . aspirin EC 81 MG  tablet Take 1 tablet (81 mg total) by mouth daily.  Marland Kitchen atorvastatin (LIPITOR) 80 MG tablet Take 1 tablet (80 mg total) by mouth every evening.  . Blood Glucose Monitoring Suppl (ONE TOUCH ULTRA MINI) w/Device KIT 1 kit by Does not apply route as directed.  . clotrimazole-betamethasone (LOTRISONE) cream Apply 1 application 2 (two) times daily topically. For 2 weeks then stop  . fluticasone (FLONASE) 50 MCG/ACT nasal spray Place 2 sprays into both nostrils daily. Use for 4-6 weeks then stop and use seasonally or as needed.  Marland Kitchen glucose blood test  strip Check blood sugar twice daily. Dx.E11.9  . Insulin Pen Needle (PEN NEEDLES) 32G X 4 MM MISC 1 Device by Does not apply route every 7 (seven) days.  Marland Kitchen lisinopril (PRINIVIL,ZESTRIL) 40 MG tablet TAKE 1 TABLET BY MOUTH ONCE DAILY  . metFORMIN (GLUCOPHAGE) 1000 MG tablet TAKE 1 TABLET BY MOUTH TWICE DAILY WITH MEALS  . ONETOUCH DELICA LANCETS 62I MISC 1 each by Does not apply route 2 (two) times daily. Dx: E11.9  . pantoprazole (PROTONIX) 40 MG tablet TAKE 1 TABLET BY MOUTH BEFORE BREAKFAST   No current facility-administered medications on file prior to visit.     Review of Systems Per HPI unless specifically indicated above     Objective:    BP 135/80 (BP Location: Left Arm, Cuff Size: Normal) Comment: home reading  Pulse 86   Temp 99 F (37.2 C)   Resp 16   Ht '5\' 11"'$  (1.803 m)   Wt 235 lb 9.6 oz (106.9 kg)   BMI 32.86 kg/m   Wt Readings from Last 3 Encounters:  02/16/18 235 lb 9.6 oz (106.9 kg)  02/10/18 231 lb 9.6 oz (105.1 kg)  09/19/17 242 lb (109.8 kg)    Physical Exam  Constitutional: He is oriented to person, place, and time. He appears well-developed and well-nourished. No distress.  Well-appearing, comfortable, cooperative  HENT:  Head: Normocephalic and atraumatic.  Mouth/Throat: Oropharynx is clear and moist.  Eyes: Pupils are equal, round, and reactive to light. Conjunctivae and EOM are normal. Right eye exhibits no  discharge. Left eye exhibits no discharge.  Neck: Normal range of motion. Neck supple. No thyromegaly present.  Cardiovascular: Normal rate, regular rhythm, normal heart sounds and intact distal pulses.  No murmur heard. Pulmonary/Chest: Effort normal and breath sounds normal. No respiratory distress. He has no wheezes. He has no rales.  Abdominal: Soft. Bowel sounds are normal. He exhibits no distension and no mass. There is no tenderness.  Musculoskeletal: Normal range of motion. He exhibits no edema or tenderness.  Upper / Lower Extremities: - Normal muscle tone, strength bilateral upper extremities 5/5, lower extremities 5/5  Lymphadenopathy:    He has no cervical adenopathy.  Neurological: He is alert and oriented to person, place, and time.  Distal sensation intact to light touch all extremities  Skin: Skin is warm and dry. No rash noted. He is not diaphoretic. No erythema.  Psychiatric: He has a normal mood and affect. His behavior is normal.  Well groomed, good eye contact, normal speech and thoughts  Nursing note and vitals reviewed.    Diabetic Foot Exam - Simple   Simple Foot Form Diabetic Foot exam was performed with the following findings:  Yes 02/16/2018  9:41 AM  Visual Inspection See comments:  Yes Sensation Testing Intact to touch and monofilament testing bilaterally:  Yes Pulse Check Posterior Tibialis and Dorsalis pulse intact bilaterally:  Yes Comments Bilateral slight dry callus formation and skin on heels. Intact monofilament sensation bilateral.    Recent Labs    02/25/17 0812 08/13/17 0953 02/10/18 0000  HGBA1C 7.5* 7.7* 7.6*    Results for orders placed or performed in visit on 02/10/18  PSA, Total with Reflex to PSA, Free  Result Value Ref Range   PSA, Total 0.4 < OR = 4.0 ng/mL  CBC with Differential/Platelet  Result Value Ref Range   WBC 7.9 3.8 - 10.8 Thousand/uL   RBC 5.46 4.20 - 5.80 Million/uL   Hemoglobin 15.8 13.2 - 17.1 g/dL   HCT  47.2  38.5 - 50.0 %   MCV 86.4 80.0 - 100.0 fL   MCH 28.9 27.0 - 33.0 pg   MCHC 33.5 32.0 - 36.0 g/dL   RDW 12.4 11.0 - 15.0 %   Platelets 268 140 - 400 Thousand/uL   MPV 10.2 7.5 - 12.5 fL   Neutro Abs 4,155 1,500 - 7,800 cells/uL   Lymphs Abs 2,607 850 - 3,900 cells/uL   WBC mixed population 774 200 - 950 cells/uL   Eosinophils Absolute 292 15 - 500 cells/uL   Basophils Absolute 71 0 - 200 cells/uL   Neutrophils Relative % 52.6 %   Total Lymphocyte 33.0 %   Monocytes Relative 9.8 %   Eosinophils Relative 3.7 %   Basophils Relative 0.9 %  Lipid panel  Result Value Ref Range   Cholesterol 135 <200 mg/dL   HDL 31 (L) >40 mg/dL   Triglycerides 88 <150 mg/dL   LDL Cholesterol (Calc) 86 mg/dL (calc)   Total CHOL/HDL Ratio 4.4 <5.0 (calc)   Non-HDL Cholesterol (Calc) 104 <130 mg/dL (calc)  Hemoglobin A1c  Result Value Ref Range   Hgb A1c MFr Bld 7.6 (H) <5.7 % of total Hgb   Mean Plasma Glucose 171 (calc)   eAG (mmol/L) 9.5 (calc)  COMPLETE METABOLIC PANEL WITH GFR  Result Value Ref Range   Glucose, Bld 150 (H) 65 - 99 mg/dL   BUN 15 7 - 25 mg/dL   Creat 1.04 0.70 - 1.25 mg/dL   GFR, Est Non African American 74 > OR = 60 mL/min/1.28m   GFR, Est African American 86 > OR = 60 mL/min/1.756m  BUN/Creatinine Ratio NOT APPLICABLE 6 - 22 (calc)   Sodium 140 135 - 146 mmol/L   Potassium 4.4 3.5 - 5.3 mmol/L   Chloride 105 98 - 110 mmol/L   CO2 25 20 - 32 mmol/L   Calcium 10.0 8.6 - 10.3 mg/dL   Total Protein 7.6 6.1 - 8.1 g/dL   Albumin 4.3 3.6 - 5.1 g/dL   Globulin 3.3 1.9 - 3.7 g/dL (calc)   AG Ratio 1.3 1.0 - 2.5 (calc)   Total Bilirubin 1.0 0.2 - 1.2 mg/dL   Alkaline phosphatase (APISO) 83 40 - 115 U/L   AST 13 10 - 35 U/L   ALT 17 9 - 46 U/L      Assessment & Plan:   Problem List Items Addressed This Visit    Chronic bilateral low back pain    Stable without acute flare of back pain Discontinue Baclofen May increase Tylenol dose up to 3g in 24 hours safely as discussed  OTC Tylenol May take Naproxen PRN - caution regular use with kidney function Follow-up in future if need re-evaluation      Chronic diastolic congestive heart failure (HCC)    Stable, asymptomatic, Grade 1 Diastolic Dysfunction Incidental identified on ECHO 07/2017 when he had CVA work-up Continue current meds, no change at this time      Relevant Medications   sildenafil (REVATIO) 20 MG tablet   Erectile dysfunction    Consistent with likely age-related ED - history of DM, HTN - no prior trial on PDE5 inhibitors  Plan: 1. Trial on generic Sildenafil '20mg'$  tabs, take 1-5 tabs about 30 min prior to sexual activity, refills provided - printed, may go to [facility] or [facility] if need larger # pills or refill 2. Follow-up as needed      Relevant Medications   sildenafil (REVATIO) 20 MG tablet  GERD (gastroesophageal reflux disease)    Stable without flare Continue PPI Protonix '40mg'$       History of CVA (cerebrovascular accident) without residual deficits    Stable without any residual deficits Remains on high intensity Statin, ASA 81, off Plavix as he declined to restart this for longer course.      Hyperlipidemia due to type 2 diabetes mellitus (HCC)    Improved control lipids now, on high intensity statin Elevated ASCVD 10 yr risk score with Diabetes Complication history of CVA  Plan: 1. Continue current meds - Atorvastatin '80mg'$  daily 2. Continue ASA '81mg'$  for primary ASCVD risk reduction - OFF Plavix 3. Encourage improved lifestyle - low carb/cholesterol, reduce portion size, continue improving regular exercise 4. Follow-up 6 months, yearly lipids      Relevant Medications   sildenafil (REVATIO) 20 MG tablet   Hypertension    Mildly elevated initial BP. Home reading is normal. He admits elevated BP on our cuff here in office S/p CVA  Plan:  1. Continue current BP regimen - Lisinopril '40mg'$  daily 2. Encourage improved lifestyle - low sodium diet, regular  exercise 3. Continue monitor BP outside office, bring readings to next visit, if persistently >140/90 or new symptoms notify office sooner 4. Follow-up 6 months      Relevant Medications   sildenafil (REVATIO) 20 MG tablet   Moderate episode of recurrent major depressive disorder (HCC)    Stable chronic mood disorder with recurrent depression Refused PHQ today, recently asked at [facility] within past 1 month, improved scores Refuses treatment SSRI or referral Follow-up as advised      Type 2 diabetes mellitus with other specified complication (HCC)    Stable A1c 7.6, from prior 7.5 to 7.8 Only occasional Hyperglycemia. No evidence of Hypoglycemia. Complication with R cataracts and other including hyperlipidemia, GERD, depression, obesity - inreases risk of future cardiovascular complications / poor glucose control due to reduced lifestyle diet/exercise with low energy mood and fatigue OFF Victoza  Plan:  1. Continue current therapy - Metformin '1000mg'$  BID 2. He declines ever going on insulin due to his needle phobia 3. No CBG log today 4. Encourage improved lifestyle - low carb, low sugar diet, reduce portion size, continue improving regular exercise 5. Continue ASA, ACEi, Statin 6. Due for DM eye - needs to f/u with ophtho also with cataracts, admits limited financial barrier 7. Follow-up q 6 months for DM A1c       Other Visit Diagnoses    Annual physical exam    -  Primary    Updated Health Maintenance information Reviewed recent lab results with patient Encouraged improvement to lifestyle with diet and exercise     Meds ordered this encounter  Medications  . sildenafil (REVATIO) 20 MG tablet    Sig: Take 1-5 pills about 30 min prior to sex. Start with 1 and increase as needed.    Dispense:  10 tablet    Refill:  2     Follow up plan: Return in about 6 months (around 08/19/2018) for DM A1c, HTN, Mood PHQ.  Nobie Putnam, [facility] 02/16/2018, 1:18 PM

## 2018-02-16 NOTE — Assessment & Plan Note (Signed)
Mildly elevated initial BP. Home reading is normal. He admits elevated BP on our cuff here in office S/p CVA  Plan:  1. Continue current BP regimen - Lisinopril 40mg  daily 2. Encourage improved lifestyle - low sodium diet, regular exercise 3. Continue monitor BP outside office, bring readings to next visit, if persistently >140/90 or new symptoms notify office sooner 4. Follow-up 6 months

## 2018-02-16 NOTE — Assessment & Plan Note (Signed)
Stable A1c 7.6, from prior 7.5 to 7.8 Only occasional Hyperglycemia. No evidence of Hypoglycemia. Complication with R cataracts and other including hyperlipidemia, GERD, depression, obesity - inreases risk of future cardiovascular complications / poor glucose control due to reduced lifestyle diet/exercise with low energy mood and fatigue OFF Victoza  Plan:  1. Continue current therapy - Metformin 1000mg  BID 2. He declines ever going on insulin due to his needle phobia 3. No CBG log today 4. Encourage improved lifestyle - low carb, low sugar diet, reduce portion size, continue improving regular exercise 5. Continue ASA, ACEi, Statin 6. Due for DM eye - needs to f/u with ophtho also with cataracts, admits limited financial barrier 7. Follow-up q 6 months for DM A1c

## 2018-02-16 NOTE — Assessment & Plan Note (Signed)
Improved control lipids now, on high intensity statin Elevated ASCVD 10 yr risk score with Diabetes Complication history of CVA  Plan: 1. Continue current meds - Atorvastatin 80mg  daily 2. Continue ASA 81mg  for primary ASCVD risk reduction - OFF Plavix 3. Encourage improved lifestyle - low carb/cholesterol, reduce portion size, continue improving regular exercise 4. Follow-up 6 months, yearly lipids

## 2018-02-16 NOTE — Assessment & Plan Note (Signed)
Stable without any residual deficits Remains on high intensity Statin, ASA 81, off Plavix as he declined to restart this for longer course.

## 2018-02-16 NOTE — Assessment & Plan Note (Signed)
Stable without flare Continue PPI Protonix 40mg 

## 2018-02-16 NOTE — Assessment & Plan Note (Signed)
Stable without acute flare of back pain Discontinue Baclofen May increase Tylenol dose up to 3g in 24 hours safely as discussed OTC Tylenol May take Naproxen PRN - caution regular use with kidney function Follow-up in future if need re-evaluation

## 2018-02-16 NOTE — Assessment & Plan Note (Signed)
Stable, asymptomatic, Grade 1 Diastolic Dysfunction Incidental identified on ECHO 07/2017 when he had CVA work-up Continue current meds, no change at this time

## 2018-04-16 ENCOUNTER — Other Ambulatory Visit: Payer: Self-pay | Admitting: Family Medicine

## 2018-04-16 DIAGNOSIS — E1169 Type 2 diabetes mellitus with other specified complication: Secondary | ICD-10-CM

## 2018-04-16 DIAGNOSIS — E785 Hyperlipidemia, unspecified: Principal | ICD-10-CM

## 2018-04-17 ENCOUNTER — Other Ambulatory Visit: Payer: Self-pay | Admitting: Family Medicine

## 2018-04-17 DIAGNOSIS — J3089 Other allergic rhinitis: Secondary | ICD-10-CM

## 2018-04-17 NOTE — Telephone Encounter (Signed)
Pt   Called requesting refill on Flonase called into [facility] [place]

## 2018-06-12 ENCOUNTER — Telehealth: Payer: Self-pay | Admitting: Family Medicine

## 2018-06-12 DIAGNOSIS — M4726 Other spondylosis with radiculopathy, lumbar region: Secondary | ICD-10-CM

## 2018-06-12 DIAGNOSIS — S46012S Strain of muscle(s) and tendon(s) of the rotator cuff of left shoulder, sequela: Secondary | ICD-10-CM

## 2018-06-12 MED ORDER — NAPROXEN 500 MG PO TABS: 500.0000 mg | ORAL_TABLET | Freq: Two times a day (BID) | ORAL | 2 refills | Status: DC

## 2018-06-12 NOTE — Telephone Encounter (Signed)
Pt stopped by office requesting refill on naproxen. Called into [facility] [place]

## 2018-06-12 NOTE — Telephone Encounter (Signed)
Refilled  Bruce Pilar, DO [facility] 06/12/2018, 6:06 PM

## 2018-08-24 ENCOUNTER — Ambulatory Visit (INDEPENDENT_AMBULATORY_CARE_PROVIDER_SITE_OTHER): Payer: Medicare Other | Admitting: Family Medicine

## 2018-08-24 ENCOUNTER — Ambulatory Visit
Admission: RE | Admit: 2018-08-24 | Discharge: 2018-08-24 | Disposition: A | Discharge status: Home / Self Care | Payer: Medicare Other | Source: Ambulatory Visit | Attending: Family Medicine | Admitting: Family Medicine

## 2018-08-24 ENCOUNTER — Encounter: Payer: Self-pay | Admitting: Family Medicine

## 2018-08-24 VITALS — BP 158/85 | HR 103 | Temp 98.7°F | Resp 16 | Ht 71.0 in | Wt 236.0 lb

## 2018-08-24 DIAGNOSIS — N400 Enlarged prostate without lower urinary tract symptoms: Secondary | ICD-10-CM

## 2018-08-24 DIAGNOSIS — I10 Essential (primary) hypertension: Secondary | ICD-10-CM

## 2018-08-24 DIAGNOSIS — R350 Frequency of micturition: Secondary | ICD-10-CM | POA: Diagnosis not present

## 2018-08-24 DIAGNOSIS — R0609 Other forms of dyspnea: Secondary | ICD-10-CM | POA: Diagnosis not present

## 2018-08-24 DIAGNOSIS — E785 Hyperlipidemia, unspecified: Secondary | ICD-10-CM

## 2018-08-24 DIAGNOSIS — E1169 Type 2 diabetes mellitus with other specified complication: Secondary | ICD-10-CM

## 2018-08-24 DIAGNOSIS — J209 Acute bronchitis, unspecified: Secondary | ICD-10-CM

## 2018-08-24 DIAGNOSIS — R0600 Dyspnea, unspecified: Secondary | ICD-10-CM

## 2018-08-24 DIAGNOSIS — F331 Major depressive disorder, recurrent, moderate: Secondary | ICD-10-CM

## 2018-08-24 DIAGNOSIS — I5032 Chronic diastolic (congestive) heart failure: Secondary | ICD-10-CM | POA: Diagnosis not present

## 2018-08-24 DIAGNOSIS — R0602 Shortness of breath: Secondary | ICD-10-CM | POA: Diagnosis not present

## 2018-08-24 LAB — POCT URINALYSIS DIPSTICK
Bilirubin, UA: NEGATIVE
Glucose, UA: NEGATIVE
Ketones, UA: NEGATIVE
Leukocytes, UA: NEGATIVE
Nitrite, UA: NEGATIVE
Protein, UA: POSITIVE — AB
Spec Grav, UA: >=1.03 — AB (ref 1.010–1.025)
Urobilinogen, UA: 0.2 E.U./dL
pH, UA: 5 (ref 5.0–8.0)

## 2018-08-24 NOTE — Patient Instructions (Addendum)
Thank you for coming to the office today.  Urine test today - to see if any potential urinary infection.  If urine is normal and sugar is normal, we will likely get you back on the prostate medication  Check Chest X-ray today to see if any potential pneumonia  If not improving shortness of breath, notify office we will likely refer you to Cardiologist for your symptoms for 2nd opinion.  A1c test today, stay tuned.  Please check in with Dr Clydene Pugh for next Diabetic Eye Exam - in 2020.  DUE for FASTING BLOOD WORK (no food or drink after midnight before the lab appointment, only water or coffee without cream/sugar on the morning of)  SCHEDULE "Lab Only" visit in the morning at the clinic for lab draw in 6 MONTHS   - Make sure Lab Only appointment is at about 1 week before your next appointment, so that results will be available  For Lab Results, once available within 2-3 days of blood draw, you can can log in to MyChart online to view your results and a brief explanation. Also, we can discuss results at next follow-up visit.  Please schedule a Follow-up Appointment to: Return in about 6 months (around 02/22/2019) for Annual Physical.  If you have any other questions or concerns, please feel free to call the office or send a message through MyChart. You may also schedule an earlier appointment if necessary.  Additionally, you may be receiving a survey about your experience at our office within a few days to 1 week by e-mail or mail. We value your feedback.  Saralyn Pilar, DO [facility], [place]

## 2018-08-24 NOTE — Progress Notes (Addendum)
Subjective:    Patient ID: Bruce Richardson, male    DOB: 1951-05-11, 68 y.o.   MRN: [id number]  Bruce Richardson is a 68 y.o. male presenting on 08/24/2018 for Diabetes (obtw SOB onset 2 weeks, HA, sweating) and Nocturia (onset week)  HPI   Urinary Frequency / Nocturia / BPH LUTS Reports new problem for past 2 weeks with increased urinary frequency. He admits that he used to have some urinary symptoms in past, he was treated with prostate medication by previous doctor, but then last PCP Dr Juanetta Gosling has taken him off of it. Denies any dysuria, hematuria, abdominal or pelvic pain  CHRONIC DM, Type 2: He declines A1c fingerstick, states that he prefers lab only after I have advised him it is more accurate. CBGs: Avg 100-130 by his report. No detailed log. Meds: Metformin 1000mg  BID Reports good compliance. Tolerating well w/o side-effects Currently on ACEi, ASA, Statin Lifestyle: - Diet (not drinking soda. Drinks some OJ. Mostly water) - Exercise (limited) Declined DM Eye Exam due to cost. Denies hypoglycemia  CHRONIC HTN: Reports elevated BP recently Current Meds - Lisinopril 40mg  daily   Reports good compliance, took meds today. Tolerating well, w/o complaints. Denies CP, HA, edema, dizziness / lightheadedness  Dyspnea on Exertion / COUGH / History of Diastolic CHF Grade 1 - Reports onset about 2 weeks ago with shortness of breath he noticed some on exertion. - He has had some difficulty with deep breathing at times and feels chest "tight" or congested. - Admits wake up with some sweats - Admits feeling off-balance and dizzy episodic - He admits had some belching and gas and improved w/ alka seltzer recently Additionally background with prior ECHO in 07/2017 showed Grade 1 Diastolic Dysfunction, but he is not followed by Cardiology - Denies fevers, nausea vomiting abdominal pain, body aches  Chronic Depression, recurrent, active He has long history of mood disorder and depression, he  has declined medication management and psychiatry/therapist in past, see prior note for background. - Currently not on med still. He declines PHQ questions today again, he offers resistance to completing these questions because he states that we have already asked him at previous visit, despite counseling that given history of depression he should provide update at least every >4 months. He has shown improvement in PHQ scores previously. He declines today stating that it is "about the same". - Denies any concerns of self harm or harm to others  Health Maintenance: Due for Flu Shot, declines today despite counseling on benefits Declines Pneumonia vaccines previously   Depression screen [facility] 2/9 02/10/2018 02/04/2017 11/14/2016  Decreased Interest 0 2 0  Down, Depressed, Hopeless 3 3 0  PHQ - 2 Score 3 5 0  Altered sleeping 3 3 -  Tired, decreased energy 1 2 -  Change in appetite 0 2 -  Feeling bad or failure about yourself  0 3 -  Trouble concentrating 0 2 -  Moving slowly or fidgety/restless 0 0 -  Suicidal thoughts 0 1 -  PHQ-9 Score 7 18 -  Difficult doing work/chores Not difficult at all Somewhat difficult -    Social History   Tobacco Use  . Smoking status: Former Smoker    Packs/day: 1.50    Types: Cigarettes    Start date: 12/19/2001    Last attempt to quit: 07/03/2002    Years since quitting: 16.1  . Smokeless tobacco: Former Neurosurgeon  . Tobacco comment: quit smoking in 06/2001  Substance Use Topics  .  Alcohol use: No    Alcohol/week: 0.0 standard drinks  . Drug use: No    Review of Systems Per HPI unless specifically indicated above     Objective:    BP (!) 158/85   Pulse (!) 103   Temp 98.7 F (37.1 C)   Resp 16   Ht 5\' 11"  (1.803 m)   Wt 236 lb (107 kg)   SpO2 95%   BMI 32.92 kg/m   Wt Readings from Last 3 Encounters:  08/24/18 236 lb (107 kg)  02/16/18 235 lb 9.6 oz (106.9 kg)  02/10/18 231 lb 9.6 oz (105.1 kg)    Physical Exam Vitals signs and nursing  note reviewed.  Constitutional:      General: He is not in acute distress.    Appearance: He is well-developed. He is not diaphoretic.     Comments: Well-appearing, comfortable, cooperative  HENT:     Head: Normocephalic and atraumatic.  Eyes:     General:        Right eye: No discharge.        Left eye: No discharge.     Conjunctiva/sclera: Conjunctivae normal.  Cardiovascular:     Rate and Rhythm: Normal rate.  Pulmonary:     Effort: Pulmonary effort is normal.     Breath sounds: Normal breath sounds. No wheezing or rales.     Comments: Good air movement. Speaks full sentences. Skin:    General: Skin is warm and dry.     Findings: No erythema or rash.  Neurological:     Mental Status: He is alert and oriented to person, place, and time.  Psychiatric:        Behavior: Behavior normal.        Thought Content: Thought content normal.        Judgment: Judgment normal.     Comments: Well groomed, good eye contact, normal speech and thoughts. Does not appear to have depressed mood and does not appear anxious. No abnormal behaviors. No abnormal sensory perceptions.    I have personally reviewed the radiology report from Chest X-ray on 08/24/18.  CLINICAL DATA:  Cough and shortness of Breath  EXAM: CHEST - 2 VIEW  COMPARISON:  None.  FINDINGS: Cardiac shadows within normal limits. The lungs are well aerated bilaterally. Mild interstitial changes are noted likely related to bronchitis. No focal confluent infiltrate is seen. No effusion is noted. No bony abnormality is noted.  IMPRESSION: Changes consistent with bronchitis.   Electronically Signed   By: Alcide CleverMark  Lukens M.D.   On: 08/24/2018 09:43  Results for orders placed or performed in visit on 08/24/18  POCT urinalysis dipstick  Result Value Ref Range   Color, UA dark amber    Clarity, UA clear    Glucose, UA Negative Negative   Bilirubin, UA negative    Ketones, UA negative    Spec Grav, UA >=1.030 (A) 1.010  - 1.025   Blood, UA trace    pH, UA 5.0 5.0 - 8.0   Protein, UA Positive (A) Negative   Urobilinogen, UA 0.2 0.2 or 1.0 E.U./dL   Nitrite, UA negative    Leukocytes, UA Negative Negative   Appearance clear    Odor none       Assessment & Plan:   Problem List Items Addressed This Visit    BPH without obstruction/lower urinary tract symptoms  Recent worsening nocturia LUTS, most likely BPH related, however seems acute onset Asymptomatic otherwise Could be related to hyperglycemia,  pending A1c - Remains off medication for BPH, he has declined this in past - Not followed by Urology  Will check UA and Urine culture today, if no explanation for frequency / nocturia, and normal results, then will advise that likely need to restart prostate BPH medication alpha blocker or can refer to Urologist.    Relevant Orders   POCT urinalysis dipstick (Completed)   Urine Culture   Chronic diastolic congestive heart failure (HCC)  Clinically euvolemic, not consistent with acute CHF exacerbation, seems less likely cause of his dyspnea recently - Prior ECHO 07/2017 with CVA work-up    Hyperlipidemia due to type 2 diabetes mellitus (HCC)  Secondary complication of DM among other lifestyle factors Due for lipid panel - advised should next check in Summer 2020, q 1 year Continue Atorvastatin    Hypertension Elevated BP today On Lisinopril Not focus of visit today, advised may be related to respiratory illness Follow-up BP outside office and at next visit.    Moderate episode of recurrent major depressive disorder (HCC)  Clinically stable mood and depression, active without worsening by his report However difficult to assess as patient is resistant to general history regarding his mood, he often declines PHQ as he did today, states it is similar to previous - score, which had improved in 01/2018 Denies any suicidal or homicidal ideation Declines treatment with medication or referral to mental  health professional    Type 2 diabetes mellitus with other specified complication (HCC) - Primary  Previous A1c 7-8 range controlled, with occasional hyperglycemia Due for A1c now No hypoglycemia Complicated by cataract, hypderlipidemia, GERD, depression - risk of cardiovascular complication / poor glucose control w/ mood and lifestyle.  Plan Check A1c today - lab draw - follow-up result Continue Metformin 1000mg  BID No CBG log today Encouraged lifestyle diet exercise improve glucose control Continue ASA, ACEi, Statin Again due for DM Eye, he has financial barrier to proceeding with this by his report Follow-up in 6 months for Annual Physical w/ labs    Relevant Orders   Hemoglobin A1c    Other Visit Diagnoses    Dyspnea on exertion     Clinically by his report recent onset x 2 weeks seems to be related to URI vs bronchitis symptoms, clinically not entirely consistent with pneumonia without focal abnormal on lung exam or documented fevers, but cannot rule out. - He is not volume overloaded, unlikely to be acute CHF exacerbation - No active chest pain, other than some chest tightness with breathing  Plan Proceed with Chest X-ray today in office - follow-up results show bronchitis, will notify patient and send antibiotic therapy course. If not improved may consider add prednisone steroid course - Advised him that again if not improving would offer 2nd opinion from Cardiology vs Pulmonology based on progression of symptoms - Strict return criteria given    Relevant Orders   DG Chest 2 View (Completed)   Urinary frequency     Clinically seems more likely BPH LUTS related Some daytime frequency, questionable - advised will rule out UTI first  Check UA and Urine culture - Shows trace blood, concentrated urine - sent for culture pending result, if negative then will recommend restart therapy for BPH, treat alpha blocker vs refer to Urologist    Relevant Orders   POCT urinalysis  dipstick (Completed)   Urine Culture      No orders of the defined types were placed in this encounter.   Follow up plan: Return in  about 6 months (around 02/22/2019) for Annual Physical.   Additional concerns from today's visit. Patient was 8 minutes late to appointment. He reportedly made inappropriate comments and joke to staff members today, and has made inappropriate remarks to staff in the past as well. We have had several disagreements regarding his plan of care and treatment plan as well, due to him refusing to proceed with testing, medications, follow medical advice, or medical non adherence and lack of follow-up care.  Ultimately for these reasons, primarily several disagreements in his medical care and a non therapeutic doctor-patient relationship - it is my opinion after discussing his case with our office manager, that he be discharged from our practice and that he may seek a new PCP going forward. I will contact patient to advise him of this plan after notify him of his test results from today.  --------------  08/25/18 - called patient 830 am with results, discussed A1c stable 7.9, Chest X-ray showed bronchitis, and UA concentrated cannot rule out UTI, pending urine culture. Advised that we can send antibiotic coverage for bronchitis given the duration and his symptoms and will also be empiric coverage for possible UTI with Augmentin sent to pharmacy. Advised him most likely BPH issue causing urination. He is frustrated stating that we have not treated his prostate for past >2 years. However there is documentation in chart over past several visits discussing his prostate and he has had minimal symptom score not meeting criteria for therapy.  He understands about being discharged and states he will find his own new doctor. He will pick up rx.  We will plan to mail him a discharge letter and provide acute only care and refill medications for up to 30 days until he can locate a new PCP.  Additionally will include follow-up information on his discharge letter if his respiratory symptoms do not improve how and when to seek help.  Saralyn PilarAlexander , DO [facility] 08/24/2018, 8:26 AM

## 2018-08-25 ENCOUNTER — Encounter: Payer: Self-pay | Admitting: Family Medicine

## 2018-08-25 LAB — HEMOGLOBIN A1C
Hgb A1c MFr Bld: 7.9 % of total Hgb — ABNORMAL HIGH (ref <5.7)
Mean Plasma Glucose: 180 (calc)
eAG (mmol/L): 10 (calc)

## 2018-08-25 LAB — URINE CULTURE
MICRO NUMBER:: 17408
Result:: NO GROWTH
SPECIMEN QUALITY:: ADEQUATE

## 2018-08-25 MED ORDER — AMOXICILLIN-POT CLAVULANATE 875-125 MG PO TABS: 1.0000 | ORAL_TABLET | Freq: Two times a day (BID) | ORAL | 0 refills | Status: DC

## 2018-08-25 MED ORDER — TAMSULOSIN HCL 0.4 MG PO CAPS: 0.4000 mg | ORAL_CAPSULE | Freq: Every day | ORAL | 0 refills | Status: DC

## 2018-08-25 NOTE — Addendum Note (Signed)
Addended by: Smitty Cords on: 08/25/2018 08:54 AM   Modules accepted: Orders

## 2018-08-26 ENCOUNTER — Encounter: Payer: Self-pay | Admitting: Family Medicine

## 2018-09-02 ENCOUNTER — Emergency Department: Payer: Medicare Other

## 2018-09-02 ENCOUNTER — Encounter: Payer: Self-pay | Admitting: Emergency Medicine

## 2018-09-02 ENCOUNTER — Emergency Department
Admission: EM | Admit: 2018-09-02 | Discharge: 2018-09-02 | Disposition: A | Discharge status: Home / Self Care | Payer: Medicare Other | Attending: Emergency Medicine | Admitting: Emergency Medicine

## 2018-09-02 DIAGNOSIS — Z7982 Long term (current) use of aspirin: Secondary | ICD-10-CM | POA: Insufficient documentation

## 2018-09-02 DIAGNOSIS — Z7984 Long term (current) use of oral hypoglycemic drugs: Secondary | ICD-10-CM | POA: Insufficient documentation

## 2018-09-02 DIAGNOSIS — R0602 Shortness of breath: Secondary | ICD-10-CM | POA: Diagnosis not present

## 2018-09-02 DIAGNOSIS — Z87891 Personal history of nicotine dependence: Secondary | ICD-10-CM | POA: Diagnosis not present

## 2018-09-02 DIAGNOSIS — E785 Hyperlipidemia, unspecified: Secondary | ICD-10-CM | POA: Insufficient documentation

## 2018-09-02 DIAGNOSIS — R000 Tachycardia, unspecified: Secondary | ICD-10-CM | POA: Diagnosis not present

## 2018-09-02 DIAGNOSIS — I5032 Chronic diastolic (congestive) heart failure: Secondary | ICD-10-CM | POA: Diagnosis not present

## 2018-09-02 DIAGNOSIS — I110 Hypertensive heart disease with heart failure: Secondary | ICD-10-CM | POA: Diagnosis not present

## 2018-09-02 DIAGNOSIS — J439 Emphysema, unspecified: Secondary | ICD-10-CM | POA: Insufficient documentation

## 2018-09-02 DIAGNOSIS — Z79899 Other long term (current) drug therapy: Secondary | ICD-10-CM | POA: Diagnosis not present

## 2018-09-02 DIAGNOSIS — E1169 Type 2 diabetes mellitus with other specified complication: Secondary | ICD-10-CM | POA: Diagnosis not present

## 2018-09-02 DIAGNOSIS — I700 Atherosclerosis of aorta: Secondary | ICD-10-CM | POA: Insufficient documentation

## 2018-09-02 LAB — COMPREHENSIVE METABOLIC PANEL
ALT: 24 U/L (ref 0–44)
AST: 27 U/L (ref 15–41)
Albumin: 4 g/dL (ref 3.5–5.0)
Alkaline Phosphatase: 92 U/L (ref 38–126)
Anion gap: 11 (ref 5–15)
BUN: 19 mg/dL (ref 8–23)
CO2: 21 mmol/L — ABNORMAL LOW (ref 22–32)
Calcium: 9.2 mg/dL (ref 8.9–10.3)
Chloride: 105 mmol/L (ref 98–111)
Creatinine, Ser: 1.01 mg/dL (ref 0.61–1.24)
GFR calc Af Amer: >60 mL/min (ref >60)
GFR calc non Af Amer: >60 mL/min (ref >60)
Glucose, Bld: 189 mg/dL — ABNORMAL HIGH (ref 70–99)
Potassium: 4 mmol/L (ref 3.5–5.1)
Sodium: 137 mmol/L (ref 135–145)
Total Bilirubin: 0.9 mg/dL (ref 0.3–1.2)
Total Protein: 8 g/dL (ref 6.5–8.1)

## 2018-09-02 LAB — CBC WITH DIFFERENTIAL/PLATELET
Abs Immature Granulocytes: 0.02 10*3/uL (ref 0.00–0.07)
Basophils Absolute: 0.1 10*3/uL (ref 0.0–0.1)
Basophils Relative: 1 %
Eosinophils Absolute: 0.4 10*3/uL (ref 0.0–0.5)
Eosinophils Relative: 5 %
HCT: 46.5 % (ref 39.0–52.0)
Hemoglobin: 15.3 g/dL (ref 13.0–17.0)
Immature Granulocytes: 0 %
Lymphocytes Relative: 25 %
Lymphs Abs: 2.2 10*3/uL (ref 0.7–4.0)
MCH: 28.5 pg (ref 26.0–34.0)
MCHC: 32.9 g/dL (ref 30.0–36.0)
MCV: 86.6 fL (ref 80.0–100.0)
Monocytes Absolute: 1 10*3/uL (ref 0.1–1.0)
Monocytes Relative: 11 %
Neutro Abs: 5.1 10*3/uL (ref 1.7–7.7)
Neutrophils Relative %: 58 %
Platelets: 345 10*3/uL (ref 150–400)
RBC: 5.37 MIL/uL (ref 4.22–5.81)
RDW: 12.5 % (ref 11.5–15.5)
WBC: 8.8 10*3/uL (ref 4.0–10.5)
nRBC: 0 % (ref 0.0–0.2)

## 2018-09-02 LAB — FIBRIN DERIVATIVES D-DIMER (ARMC ONLY): Fibrin derivatives D-dimer (ARMC): 996.4 ng/mL (FEU) — ABNORMAL HIGH (ref 0.00–499.00)

## 2018-09-02 MED ORDER — IOHEXOL 350 MG/ML SOLN
75.0000 mL | Freq: Once | INTRAVENOUS | Status: AC | PRN
  Administered 2018-09-02: 75 mL via INTRAVENOUS

## 2018-09-02 MED ORDER — SODIUM CHLORIDE 0.9 % IV BOLUS
1000.0000 mL | Freq: Once | INTRAVENOUS | Status: AC
  Administered 2018-09-02: 1000 mL via INTRAVENOUS

## 2018-09-02 NOTE — ED Provider Notes (Signed)
[facility] Emergency Department Provider Note  ____________________________________________  Time seen: Approximately 1:32 PM  I have reviewed the triage vital signs and the nursing notes.   HISTORY  Chief Complaint Shortness of Breath    HPI Bruce Richardson is a 68 y.o. male with a history of hypertension and diabetes who complains of shortness of breath for the past 4 weeks.  Gradual onset, denies chest pain.  No cough fevers or chills.  No body aches.  He saw primary care about a week ago who put him on Augmentin and prednisone.  He reports that he has been taking those medications as prescribed but not improving.  Never had anything like this before.  He had a prior hospitalization for TIA back in December 2019.  No recent surgeries travel or history of DVT or PE.      Past Medical History:  Diagnosis Date  . Arthritis   . Bulging lumbar disc    lower back  . Complication of anesthesia    kept moving while under anesthesia during colonoscopy, hard to put under.  . Diabetes mellitus without complication ([place])   . GERD (gastroesophageal reflux disease)   . Hypertension      Patient Active Problem List   Diagnosis Date Noted  . Chronic diastolic congestive heart failure ([place]) 02/16/2018  . Moderate episode of recurrent major depressive disorder ([place]) 02/16/2018  . Erectile dysfunction 02/16/2018  . History of CVA (cerebrovascular accident) without residual deficits 08/13/2017  . BPH without obstruction/lower urinary tract symptoms 03/19/2017  . Chronic bilateral low back pain 11/14/2016  . 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 GERD (gastroesophageal reflux disease) 04/11/2016  . Obesity 04/11/2016  . Allergic rhinitis 08/04/2015  . Hypertension 07/04/2015  . Hyperlipidemia due to type 2 diabetes mellitus ([place]) 07/04/2015  . Type 2 diabetes mellitus with other specified complication ([place]) 16/60/6301  . Non-retracting  foreskin 07/04/2015  . Tendinitis of elbow or forearm 04/26/2015  . Injury of tendon of upper extremity 12/23/2014  . Complete rotator cuff rupture of left shoulder 09/23/2014  . H/O repair of rotator cuff 08/31/2014  . History of repair of rotator cuff 08/31/2014     Past Surgical History:  Procedure Laterality Date  . CIRCUMCISION N/A 07/24/2015   Procedure: CIRCUMCISION ADULT;  Surgeon: Nickie Retort, MD;  Location: [facility] ORS;  Service: Urology;  Laterality: N/A;  . EYE SURGERY Right    right eye cataract surgery   . SHOULDER ARTHROSCOPY Bilateral   . SHOULDER ARTHROSCOPY Right 12/22/2014   Procedure: RIGHT SHOULDER ARTHROSCOPY /DECOMPRESSION/ROTATOR CUFF REPAIR OF RECURRENT ROTATOR CUFF TEAR;  Surgeon: Corky Mull, MD;  Location: [facility] ORS;  Service: Orthopedics;  Laterality: Right;     Prior to Admission medications   Medication Sig Start Date End Date Taking? Authorizing Provider  ACCU-CHEK SOFTCLIX LANCETS lancets 100 each by Other route 2 (two) times daily. for testing 12/12/17   Olin Hauser, DO  amoxicillin-clavulanate (AUGMENTIN) 875-125 MG tablet Take 1 tablet by mouth 2 (two) times daily. For 10 days 08/25/18   Olin Hauser, DO  aspirin EC 81 MG tablet Take 1 tablet (81 mg total) by mouth daily. 01/29/17   Karamalegos, Devonne Doughty, DO  atorvastatin (LIPITOR) 80 MG tablet TAKE 1 TABLET BY MOUTH IN THE MORNING 04/16/18   Karamalegos, Devonne Doughty, DO  Blood Glucose Monitoring Suppl (ONE TOUCH ULTRA MINI) w/Device KIT 1 kit by Does not apply route as directed. 12/12/17  Karamalegos, Devonne Doughty, DO  clotrimazole-betamethasone (LOTRISONE) cream Apply 1 application 2 (two) times daily topically. For 2 weeks then stop 06/24/17   Karamalegos, Alexander J, DO  fluticasone (FLONASE) 50 MCG/ACT nasal spray USE 2 SPRAY(S) IN EACH NOSTRIL ONCE DAILY USE  FOR  4-6  WEEKS  THEN  STOP  AND  USE  SEASONALLY  OR  AS  NEEDED 04/17/18   Parks Ranger, Devonne Doughty, DO  glucose  blood test strip Check blood sugar twice daily. Dx.E11.9 12/12/17   Karamalegos, Devonne Doughty, DO  Insulin Pen Needle (PEN NEEDLES) 32G X 4 MM MISC 1 Device by Does not apply route every 7 (seven) days. 07/10/15   Roselee Nova, MD  lisinopril (PRINIVIL,ZESTRIL) 40 MG tablet TAKE 1 TABLET BY MOUTH ONCE DAILY 01/08/18   Parks Ranger, Devonne Doughty, DO  metFORMIN (GLUCOPHAGE) 1000 MG tablet TAKE 1 TABLET BY MOUTH TWICE DAILY WITH MEALS 01/08/18   Karamalegos, Devonne Doughty, DO  naproxen (NAPROSYN) 500 MG tablet Take 1 tablet (500 mg total) by mouth 2 (two) times daily with a meal. For 2-4 weeks then as needed 06/12/18   Karamalegos, Devonne Doughty, DO  ONETOUCH DELICA LANCETS 67O MISC 1 each by Does not apply route 2 (two) times daily. Dx: E11.9 04/30/16   Olin Hauser, DO  pantoprazole (PROTONIX) 40 MG tablet TAKE 1 TABLET BY MOUTH BEFORE BREAKFAST 01/08/18   Karamalegos, Devonne Doughty, DO  sildenafil (REVATIO) 20 MG tablet Take 1-5 pills about 30 min prior to sex. Start with 1 and increase as needed. 02/16/18   Karamalegos, Devonne Doughty, DO  tamsulosin (FLOMAX) 0.4 MG CAPS capsule Take 1 capsule (0.4 mg total) by mouth daily. For prostate 08/25/18   Olin Hauser, DO     Allergies Oxycodone   Family History  Problem Relation Age of Onset  . Alzheimer's disease Mother   . Diabetes Mother   . Hypertension Mother   . Cancer Father   . Heart failure Brother   . Diabetes Brother   . Hypertension Brother   . Heart disease Brother   . Prostate cancer Other        unknown  . Bladder Cancer Other        unknown  . Heart disease Son   . Diabetes Sister     Social History Social History   Tobacco Use  . Smoking status: Former Smoker    Packs/day: 1.50    Types: Cigarettes    Start date: 12/19/2001    Last attempt to quit: 07/03/2002    Years since quitting: 16.1  . Smokeless tobacco: Former Systems developer  . Tobacco comment: quit smoking in 06/2001  Substance Use Topics  . Alcohol use:  No    Alcohol/week: 0.0 standard drinks  . Drug use: No    Review of Systems  Constitutional:   No fever or chills.  ENT:   No sore throat. No rhinorrhea. Cardiovascular:   No chest pain or syncope. Respiratory:   Positive shortness of breath without cough. Gastrointestinal:   Negative for abdominal pain, vomiting and diarrhea.  Musculoskeletal:   Negative for focal pain or swelling All other systems reviewed and are negative except as documented above in ROS and HPI.  ____________________________________________   PHYSICAL EXAM:  VITAL SIGNS: ED Triage Vitals  Enc Vitals Group     BP 09/02/18 1005 (!) 153/73     Pulse Rate 09/02/18 1005 (!) 122     Resp 09/02/18 1005 (!) 22  Temp 09/02/18 1005 98.4 F (36.9 C)     Temp Source 09/02/18 1005 Oral     SpO2 09/02/18 1005 93 %     Weight 09/02/18 1007 236 lb (107 kg)     Height 09/02/18 1007 '5\' 11"'$  (1.803 m)     Head Circumference --      Peak Flow --      Pain Score 09/02/18 1007 0     Pain Loc --      Pain Edu? --      Excl. in [place]? --     Vital signs reviewed, nursing assessments reviewed.   Constitutional:   Alert and oriented. Non-toxic appearance. Eyes:   Conjunctivae are normal. EOMI. PERRL. ENT      Head:   Normocephalic and atraumatic.      Nose:   No congestion/rhinnorhea.       Mouth/Throat:   MMM, no pharyngeal erythema. No peritonsillar mass.       Neck:   No meningismus. Full ROM. Hematological/Lymphatic/Immunilogical:   No cervical lymphadenopathy. Cardiovascular:   RRR. Symmetric bilateral radial and DP pulses.  No murmurs. Cap refill less than 2 seconds. Respiratory:   Normal respiratory effort without tachypnea/retractions. Breath sounds are clear and equal bilaterally. No wheezes/rales/rhonchi. Gastrointestinal:   Soft and nontender. Non distended. There is no CVA tenderness.  No rebound, rigidity, or guarding.  Musculoskeletal:   Normal range of motion in all extremities. No joint effusions.   No lower extremity tenderness.  No edema. Neurologic:   Normal speech and language.  Motor grossly intact. No acute focal neurologic deficits are appreciated.  Skin:    Skin is warm, dry and intact. No rash noted.  No petechiae, purpura, or bullae.  ____________________________________________    LABS (pertinent positives/negatives) (all labs ordered are listed, but only abnormal results are displayed) Labs Reviewed  COMPREHENSIVE METABOLIC PANEL - Abnormal; Notable for the following components:      Result Value   CO2 21 (*)    Glucose, Bld 189 (*)    All other components within normal limits  FIBRIN DERIVATIVES D-DIMER ([facility] ONLY) - Abnormal; Notable for the following components:   Fibrin derivatives D-dimer [facility]) 996.40 (*)    All other components within normal limits  CBC WITH DIFFERENTIAL/PLATELET   ____________________________________________   EKG  Interpreted by me Sinus tachycardia rate 120, normal axis intervals QRS ST segments and T waves.  Isolated T wave inversion in lead III which is nonspecific.  ____________________________________________    RADIOLOGY  Dg Chest 2 View  Result Date: 09/02/2018 CLINICAL DATA:  Shortness of breath for the past 9 days. EXAM: CHEST - 2 VIEW COMPARISON:  Chest x-ray dated January 23, 2019. FINDINGS: The heart size and mediastinal contours are within normal limits. Coarsened interstitial markings are similar to prior study. No focal consolidation, pleural effusion, or pneumothorax. No acute osseous abnormality. IMPRESSION: 1. Unchanged coarse interstitial markings which could reflect acute and/or chronic bronchitic changes. Electronically Signed   By: Titus Dubin M.D.   On: 09/02/2018 11:30   Ct Angio Chest Pe W And/or Wo Contrast  Result Date: 09/02/2018 CLINICAL DATA:  Shortness of breath. Evaluate for pulmonary embolism. EXAM: CT ANGIOGRAPHY CHEST WITH CONTRAST TECHNIQUE: Multidetector CT imaging of the chest was performed  using the standard protocol during bolus administration of intravenous contrast. Multiplanar CT image reconstructions and MIPs were obtained to evaluate the vascular anatomy. CONTRAST:  51m OMNIPAQUE IOHEXOL 350 MG/ML SOLN COMPARISON:  Chest radiograph - earlier same  day; 08/24/2018 FINDINGS: Vascular Findings: There is suboptimal opacification of the pulmonary arterial system with the main pulmonary artery measuring only 212 Hounsfield units. Given this limitation there are no discrete filling defects within the pulmonary arterial tree to the level the bilateral subsegmental pulmonary arteries. Evaluation of the distal subsegmental pulmonary arteries is degraded secondary to suboptimal vessel opacification. Normal caliber of the main pulmonary artery. Normal heart size.  No pericardial effusion. Atherosclerotic plaque within a normal caliber thoracic aorta. Conventional configuration of the aortic arch. The branch vessels of the aortic arch appear widely patent throughout their imaged courses. Review of the MIP images confirms the above findings. ---------------------------------------------------------------------------------- Nonvascular Findings: Mediastinum/Lymph Nodes: Scattered mediastinal lymph nodes are numerous though individually not enlarged by size criteria with index pre carinal node measuring 0.8 cm in greatest short axis diameter (image 37, series 4, index AP window lymph node measuring 0.6 cm (image 38) an index right suprahilar lymph node measuring 0.9 cm (image 41). No definitive bulky mediastinal, hilar or axillary lymphadenopathy. Lungs/Pleura: Mild to moderate apical predominant centrilobular emphysematous change. Additionally, there are scattered bilateral subpleural interstitial opacities without definitive evidence of honeycombing. No discrete focal airspace opacities. No pleural effusion or pneumothorax. The central pulmonary airways appear widely patent. No discrete pulmonary nodules.  Upper abdomen: Limited early arterial phase evaluation of the upper abdomen demonstrates a approximately 0.9 cm stone within the neck of an otherwise normal-appearing gallbladder (image 89, series 4). There is an approximately 5.8 x 5.2 cm hypoattenuating presumed cyst arising exophytically from the right kidney, incompletely imaged. Musculoskeletal: No acute or aggressive osseous abnormalities. Degenerative change within the lower cervical spine, incompletely evaluated. Regional soft tissues appear normal. Normal appearance of the thyroid gland. IMPRESSION: 1. No acute cardiopulmonary disease. Specifically, no evidence of pulmonary embolism to the level the bilateral subsegmental pulmonary arteries. 2. Mild centrilobular emphysema with suspected smoking-related parenchymal abnormalities/fibrosis without discrete areas of honeycombing to suggest UIP. Further evaluation could be performed with nonemergent ILD protocol chest CT as indicated. Emphysema (ICD10-J43.9). 3. Cholelithiasis without evidence of cholecystitis. 4.  Aortic Atherosclerosis (ICD10-I70.0). Electronically Signed   By: Sandi Mariscal M.D.   On: 09/02/2018 12:37    ____________________________________________   PROCEDURES Procedures  ____________________________________________  DIFFERENTIAL DIAGNOSIS   Pneumonia, pulmonary edema, pneumothorax, pericardial effusion, pulmonary embolism  CLINICAL IMPRESSION / ASSESSMENT AND PLAN / ED COURSE  Pertinent labs & imaging results that were available during my care of the patient were reviewed by me and considered in my medical decision making (see chart for details).    Patient presents with subacute to chronic shortness of breath, without significant wheezing.  Vital signs and labs are unremarkable, chest x-ray is nondiagnostic.  D-dimer was elevated at nearly 1000 so a CT scan was obtained which is negative for PE.  It does not show any other acute disease such as pneumonia pulmonary edema  or pericardial effusion, but does reveal evidence of emphysema.  Patient reports he quit smoking 20 years ago.  I will refer him to pulmonology for his ongoing shortness of breath symptoms and negative work-up so far.      ____________________________________________   FINAL CLINICAL IMPRESSION(S) / ED DIAGNOSES    Final diagnoses:  SOB (shortness of breath)  Pulmonary emphysema, unspecified emphysema type ([place])  Aortic atherosclerosis [facility])     ED Discharge Orders    None      Portions of this note were generated with dragon dictation software. Dictation errors may occur despite best attempts at proofreading.  Carrie Mew, MD 09/02/18 1335

## 2018-09-02 NOTE — ED Triage Notes (Signed)
Pt arrives with concerns over shortness of breath. Pt's breathing regular and unlabored in triage. Pt was seen and diagnosed with bronchitis on 1/6. Pt states he is short of breath with exertion, deep breaths, and deep breathing causes pt to cough per pt report. Pt denies pain.

## 2018-09-02 NOTE — ED Notes (Signed)
Patient verbalized understanding of discharge instructions, no questions. Patient ambulated out of ED with steady gait in no distress.  

## 2018-09-02 NOTE — ED Notes (Signed)
Patient transported to CT 

## 2018-09-02 NOTE — ED Notes (Signed)
First nurse note: being treated for bronchitis, states when he takes a deep breath, he coughs. Appears in NAD.

## 2018-09-02 NOTE — Discharge Instructions (Signed)
Your blood tests today look okay.  Your CT scan shows emphysema but no blood clots fluid or infection in the lung.  Please follow-up with the pulmonology specialist for further evaluation of your symptoms.  Continue taking all your other medications.

## 2018-09-14 DIAGNOSIS — R05 Cough: Secondary | ICD-10-CM | POA: Diagnosis not present

## 2018-09-14 DIAGNOSIS — J449 Chronic obstructive pulmonary disease, unspecified: Secondary | ICD-10-CM | POA: Diagnosis not present

## 2018-09-28 DIAGNOSIS — R05 Cough: Secondary | ICD-10-CM | POA: Diagnosis not present

## 2018-11-04 LAB — HM DIABETES EYE EXAM

## 2019-01-12 ENCOUNTER — Other Ambulatory Visit: Payer: Self-pay | Admitting: Family Medicine

## 2019-01-12 ENCOUNTER — Other Ambulatory Visit: Payer: Self-pay

## 2019-01-12 DIAGNOSIS — IMO0001 Reserved for inherently not codable concepts without codable children: Secondary | ICD-10-CM

## 2019-01-12 DIAGNOSIS — I10 Essential (primary) hypertension: Secondary | ICD-10-CM

## 2019-01-12 NOTE — Patient Outreach (Signed)
[facility]) Care Management  01/12/2019  Bruce Richardson 06/03/1951 [id number]   Medication Adherence call to Mr. Bruce Richardson [facility] Identifiers Verify spoke with patient he is due on Metformin 1000 mg and Lisinopril 40 mg patient explain he has medication for about 2 more weeks and does not have refills and does not have a doctor at this time and can not see any doctor at this time because of covid-19.patient will get in touch with the insurance to see if they can assist on this matter. [name] is showing past due under [facility] Ins.   Lillia Abed CPhT Pharmacy Technician [facility] Care Management Direct Dial [phone]  Fax [phone] [email or web address]

## 2019-01-12 NOTE — Telephone Encounter (Signed)
Patient was discharged from our practice on 08/24/18.  It has been >90 days. We are no longer writing any refills.  He should not be scheduled for any Medicare Wellness through our office.  Saralyn Pilar, DO [facility] 01/12/2019, 4:35 PM

## 2019-01-13 ENCOUNTER — Telehealth: Payer: Self-pay

## 2019-01-13 ENCOUNTER — Other Ambulatory Visit: Payer: Self-pay | Admitting: Family Medicine

## 2019-01-13 DIAGNOSIS — IMO0001 Reserved for inherently not codable concepts without codable children: Secondary | ICD-10-CM

## 2019-01-13 DIAGNOSIS — I10 Essential (primary) hypertension: Secondary | ICD-10-CM

## 2019-01-13 NOTE — Telephone Encounter (Signed)
Open in error

## 2019-01-23 ENCOUNTER — Other Ambulatory Visit: Payer: Self-pay | Admitting: Family Medicine

## 2019-01-23 DIAGNOSIS — I10 Essential (primary) hypertension: Secondary | ICD-10-CM

## 2019-01-23 DIAGNOSIS — IMO0001 Reserved for inherently not codable concepts without codable children: Secondary | ICD-10-CM

## 2019-01-26 ENCOUNTER — Ambulatory Visit (INDEPENDENT_AMBULATORY_CARE_PROVIDER_SITE_OTHER): Payer: Medicare Other | Admitting: Internal Medicine

## 2019-01-26 ENCOUNTER — Other Ambulatory Visit: Payer: Self-pay

## 2019-01-26 ENCOUNTER — Encounter: Payer: Self-pay | Admitting: Internal Medicine

## 2019-01-26 VITALS — BP 136/80 | HR 101 | Ht 71.0 in | Wt 239.0 lb

## 2019-01-26 DIAGNOSIS — K219 Gastro-esophageal reflux disease without esophagitis: Secondary | ICD-10-CM

## 2019-01-26 DIAGNOSIS — E1169 Type 2 diabetes mellitus with other specified complication: Secondary | ICD-10-CM | POA: Diagnosis not present

## 2019-01-26 DIAGNOSIS — J432 Centrilobular emphysema: Secondary | ICD-10-CM | POA: Insufficient documentation

## 2019-01-26 DIAGNOSIS — M4726 Other spondylosis with radiculopathy, lumbar region: Secondary | ICD-10-CM | POA: Diagnosis not present

## 2019-01-26 DIAGNOSIS — N400 Enlarged prostate without lower urinary tract symptoms: Secondary | ICD-10-CM

## 2019-01-26 DIAGNOSIS — IMO0001 Reserved for inherently not codable concepts without codable children: Secondary | ICD-10-CM

## 2019-01-26 DIAGNOSIS — E1165 Type 2 diabetes mellitus with hyperglycemia: Secondary | ICD-10-CM

## 2019-01-26 DIAGNOSIS — E785 Hyperlipidemia, unspecified: Secondary | ICD-10-CM

## 2019-01-26 DIAGNOSIS — I519 Heart disease, unspecified: Secondary | ICD-10-CM

## 2019-01-26 DIAGNOSIS — I700 Atherosclerosis of aorta: Secondary | ICD-10-CM | POA: Insufficient documentation

## 2019-01-26 DIAGNOSIS — I10 Essential (primary) hypertension: Secondary | ICD-10-CM | POA: Diagnosis not present

## 2019-01-26 DIAGNOSIS — Z8673 Personal history of transient ischemic attack (TIA), and cerebral infarction without residual deficits: Secondary | ICD-10-CM

## 2019-01-26 DIAGNOSIS — I5189 Other ill-defined heart diseases: Secondary | ICD-10-CM

## 2019-01-26 MED ORDER — METFORMIN HCL 1000 MG PO TABS: 1000.0000 mg | ORAL_TABLET | Freq: Two times a day (BID) | ORAL | 1 refills | Status: DC

## 2019-01-26 MED ORDER — ATORVASTATIN CALCIUM 80 MG PO TABS: 80.0000 mg | ORAL_TABLET | Freq: Every morning | ORAL | 1 refills | Status: DC

## 2019-01-26 MED ORDER — TAMSULOSIN HCL 0.4 MG PO CAPS: 0.4000 mg | ORAL_CAPSULE | Freq: Every day | ORAL | 1 refills | Status: DC

## 2019-01-26 MED ORDER — NAPROXEN 500 MG PO TABS: 500.0000 mg | ORAL_TABLET | Freq: Two times a day (BID) | ORAL | 2 refills | Status: DC

## 2019-01-26 MED ORDER — LISINOPRIL 40 MG PO TABS: 40.0000 mg | ORAL_TABLET | Freq: Every day | ORAL | 1 refills | Status: DC

## 2019-01-26 MED ORDER — PANTOPRAZOLE SODIUM 40 MG PO TBEC: 40.0000 mg | DELAYED_RELEASE_TABLET | Freq: Every day | ORAL | 1 refills | Status: DC

## 2019-01-26 NOTE — Progress Notes (Signed)
Date:  01/26/2019   Name:  Bruce Richardson   DOB:  19-Jan-1951   MRN:  [id number]   Chief Complaint: Establish Care and Diabetes Patient is transferring from [place].  He is out of his medications for the past 2 weeks.  Diabetes  He presents for his follow-up diabetic visit. He has type 2 diabetes mellitus. His disease course has been stable. Pertinent negatives for hypoglycemia include no dizziness, headaches, nervousness/anxiousness or tremors. Pertinent negatives for diabetes include no chest pain, no fatigue, no polydipsia and no polyuria. Symptoms are stable. Current diabetic treatment includes oral agent (monotherapy). He is compliant with treatment all of the time. He is following a diabetic diet. He monitors blood glucose at home 3-4 x per week. There is no change in his home blood glucose trend. His breakfast blood glucose is taken between 7-8 am. His breakfast blood glucose range is generally 110-130 mg/dl. An ACE inhibitor/angiotensin II receptor blocker is being taken.  Hypertension  This is a chronic problem. The problem is controlled. Pertinent negatives include no chest pain, headaches, palpitations or shortness of breath. Past treatments include ACE inhibitors.  Hyperlipidemia  This is a chronic problem. The problem is controlled. Pertinent negatives include no chest pain or shortness of breath. Current antihyperlipidemic treatment includes statins. The current treatment provides significant improvement of lipids.  Benign Prostatic Hypertrophy  This is a chronic problem. The problem is unchanged. Irritative symptoms include frequency and urgency. Pertinent negatives include no dysuria or hematuria. Past treatments include tamsulosin. The treatment provided moderate relief.  Gastroesophageal Reflux  He complains of heartburn. He reports no abdominal pain, no chest pain, no coughing or no wheezing. This is a recurrent problem. The problem occurs occasionally. Pertinent negatives  include no fatigue. He has tried a PPI for the symptoms. The treatment provided significant relief. Past procedures include an EGD.  CVA - pt suffered a mild CVA 18 months ago. He has no residual and has had no recurrence.  He is on aspirin 81 mg per day. Carotid US 07/2017 showed less than 50% stenosis bilaterally.  Lab Results  Component Value Date   HGBA1C 7.9 (H) 08/24/2018   Lab Results  Component Value Date   CHOL 135 02/10/2018   HDL 31 (L) 02/10/2018   LDLCALC 86 02/10/2018   TRIG 88 02/10/2018   CHOLHDL 4.4 02/10/2018    Review of Systems  Constitutional: Negative for appetite change, fatigue and unexpected weight change.  HENT: Negative for trouble swallowing.   Eyes: Negative for visual disturbance.  Respiratory: Negative for cough, shortness of breath and wheezing.   Cardiovascular: Negative for chest pain, palpitations and leg swelling.  Gastrointestinal: Positive for heartburn. Negative for abdominal pain, blood in stool and constipation.  Endocrine: Negative for polydipsia and polyuria.  Genitourinary: Positive for frequency and urgency. Negative for dysuria and hematuria.  Musculoskeletal: Positive for back pain.  Skin: Negative for color change and rash.  Allergic/Immunologic: Negative for environmental allergies.  Neurological: Negative for dizziness, tremors, numbness and headaches.  Hematological: Negative for adenopathy.  Psychiatric/Behavioral: Negative for dysphoric mood and sleep disturbance. The patient is not nervous/anxious.     Patient Active Problem List   Diagnosis Date Noted  . Chronic diastolic congestive heart failure ([place]) 02/16/2018  . Moderate episode of recurrent major depressive disorder ([place]) 02/16/2018  . Erectile dysfunction 02/16/2018  . History of CVA (cerebrovascular accident) without residual deficits 08/13/2017  . BPH without obstruction/lower urinary tract symptoms 03/19/2017  . Chronic  bilateral low back pain 11/14/2016  .  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 GERD (gastroesophageal reflux disease) 04/11/2016  . Obesity 04/11/2016  . Allergic rhinitis 08/04/2015  . Hypertension 07/04/2015  . Hyperlipidemia due to type 2 diabetes mellitus ([place]) 07/04/2015  . Type 2 diabetes mellitus with other specified complication ([place]) 25/12/3974  . Non-retracting foreskin 07/04/2015  . Tendinitis of elbow or forearm 04/26/2015  . Injury of tendon of upper extremity 12/23/2014  . Complete rotator cuff rupture of left shoulder 09/23/2014  . H/O repair of rotator cuff 08/31/2014  . History of repair of rotator cuff 08/31/2014    No Known Allergies  Past Surgical History:  Procedure Laterality Date  . CIRCUMCISION N/A 07/24/2015   Procedure: CIRCUMCISION ADULT;  Surgeon: Nickie Retort, MD;  Location: [facility] ORS;  Service: Urology;  Laterality: N/A;  . EYE SURGERY Right    right eye cataract surgery   . SHOULDER ARTHROSCOPY Bilateral   . SHOULDER ARTHROSCOPY Right 12/22/2014   Procedure: RIGHT SHOULDER ARTHROSCOPY /DECOMPRESSION/ROTATOR CUFF REPAIR OF RECURRENT ROTATOR CUFF TEAR;  Surgeon: Corky Mull, MD;  Location: [facility] ORS;  Service: Orthopedics;  Laterality: Right;    Social History   Tobacco Use  . Smoking status: Former Smoker    Packs/day: 1.50    Years: 25.00    Pack years: 37.50    Types: Cigarettes    Start date: 12/19/2001    Last attempt to quit: 07/03/2002    Years since quitting: 16.5  . Smokeless tobacco: Former Systems developer  . Tobacco comment: quit smoking in 06/2001  Substance Use Topics  . Alcohol use: No    Alcohol/week: 0.0 standard drinks  . Drug use: No     Medication list has been reviewed and updated.  Current Meds  Medication Sig  . ACCU-CHEK SOFTCLIX LANCETS lancets 100 each by Other route 2 (two) times daily. for testing  . albuterol (VENTOLIN HFA) 108 (90 Base) MCG/ACT inhaler Inhale into the lungs every 6 (six) hours as needed for wheezing or shortness of  breath.  Marland Kitchen aspirin EC 81 MG tablet Take 1 tablet (81 mg total) by mouth daily.  Marland Kitchen atorvastatin (LIPITOR) 80 MG tablet TAKE 1 TABLET BY MOUTH IN THE MORNING  . Blood Glucose Monitoring Suppl (ONE TOUCH ULTRA MINI) w/Device KIT 1 kit by Does not apply route as directed.  . fluticasone (FLONASE) 50 MCG/ACT nasal spray USE 2 SPRAY(S) IN EACH NOSTRIL ONCE DAILY USE  FOR  4-6  WEEKS  THEN  STOP  AND  USE  SEASONALLY  OR  AS  NEEDED  . glucose blood test strip Check blood sugar twice daily. Dx.E11.9  . Insulin Pen Needle (PEN NEEDLES) 32G X 4 MM MISC 1 Device by Does not apply route every 7 (seven) days.  Marland Kitchen lisinopril (PRINIVIL,ZESTRIL) 40 MG tablet TAKE 1 TABLET BY MOUTH ONCE DAILY  . metFORMIN (GLUCOPHAGE) 1000 MG tablet TAKE 1 TABLET BY MOUTH TWICE DAILY WITH MEALS  . naproxen (NAPROSYN) 500 MG tablet Take 1 tablet (500 mg total) by mouth 2 (two) times daily with a meal. For 2-4 weeks then as needed  . ONETOUCH DELICA LANCETS 73A MISC 1 each by Does not apply route 2 (two) times daily. Dx: E11.9  . pantoprazole (PROTONIX) 40 MG tablet TAKE 1 TABLET BY MOUTH BEFORE BREAKFAST    PHQ 2/9 Scores 01/26/2019 08/24/2018 08/24/2018 02/16/2018  PHQ - 2 Score 3 - - -  PHQ- 9 Score 10 - - -  Exception Documentation - Patient refusal Patient refusal Patient refusal    BP Readings from Last 3 Encounters:  01/26/19 136/80  09/02/18 118/66  08/24/18 (!) 158/85    Physical Exam Vitals signs and nursing note reviewed.  Constitutional:      General: He is not in acute distress.    Appearance: He is well-developed.  HENT:     Head: Normocephalic and atraumatic.  Neck:     Musculoskeletal: Normal range of motion.     Vascular: No carotid bruit.  Cardiovascular:     Rate and Rhythm: Normal rate and regular rhythm.     Pulses: Normal pulses.     Heart sounds: No murmur.  Pulmonary:     Effort: Pulmonary effort is normal. No respiratory distress.     Breath sounds: No wheezing or rhonchi.  Musculoskeletal:  Normal range of motion.     Right lower leg: No edema.     Left lower leg: No edema.  Lymphadenopathy:     Cervical: No cervical adenopathy.  Skin:    General: Skin is warm and dry.     Capillary Refill: Capillary refill takes less than 2 seconds.     Findings: No rash.  Neurological:     Mental Status: He is alert and oriented to person, place, and time.  Psychiatric:        Behavior: Behavior normal.        Thought Content: Thought content normal.     Wt Readings from Last 3 Encounters:  01/26/19 239 lb (108.4 kg)  09/02/18 236 lb (107 kg)  08/24/18 236 lb (107 kg)    BP 136/80   Pulse (!) 101   Ht '5\' 11"'$  (1.803 m)   Wt 239 lb (108.4 kg)   SpO2 97%   BMI 33.33 kg/m   Assessment and Plan: 1. Hyperlipidemia due to type 2 diabetes mellitus ([place]) Continue statin medication Last labs not quite at goal - atorvastatin (LIPITOR) 80 MG tablet; Take 1 tablet (80 mg total) by mouth every morning.  Dispense: 90 tablet; Refill: 1  2. Gastroesophageal reflux disease, esophagitis presence not specified Controlled with PPI - pantoprazole (PROTONIX) 40 MG tablet; Take 1 tablet (40 mg total) by mouth daily.  Dispense: 90 tablet; Refill: 1  3. Osteoarthritis of spine with radiculopathy, lumbar region Pt cautioned to use sparingly - naproxen (NAPROSYN) 500 MG tablet; Take 1 tablet (500 mg total) by mouth 2 (two) times daily with a meal. For 2-4 weeks then as needed  Dispense: 60 tablet; Refill: 2  4. Uncontrolled type 2 diabetes mellitus without complication, without long-term current use of insulin ([facility]) Continue metformin and daily FSBS - metFORMIN (GLUCOPHAGE) 1000 MG tablet; Take 1 tablet (1,000 mg total) by mouth 2 (two) times daily with a meal.  Dispense: 180 tablet; Refill: 1  5. Essential hypertension controlled - lisinopril (ZESTRIL) 40 MG tablet; Take 1 tablet (40 mg total) by mouth daily.  Dispense: 90 tablet; Refill: 1  6. BPH without obstruction/lower urinary tract  symptoms Resume Flomax - tamsulosin (FLOMAX) 0.4 MG CAPS capsule; Take 1 capsule (0.4 mg total) by mouth daily. For prostate  Dispense: 90 capsule; Refill: 1  7. History of CVA (cerebrovascular accident) without residual deficits Also aortic atherosclerosis seen on CT chest Continue Aspirin Recommend repeat Carotid US in 6 months for a 2 yr follow up   Partially dictated using Editor, commissioning. Any errors are unintentional.  Halina Maidens, MD [facility]  01/26/2019      

## 2019-02-03 ENCOUNTER — Other Ambulatory Visit: Payer: Self-pay

## 2019-02-03 DIAGNOSIS — N400 Enlarged prostate without lower urinary tract symptoms: Secondary | ICD-10-CM

## 2019-02-03 DIAGNOSIS — K219 Gastro-esophageal reflux disease without esophagitis: Secondary | ICD-10-CM

## 2019-02-03 DIAGNOSIS — E1169 Type 2 diabetes mellitus with other specified complication: Secondary | ICD-10-CM

## 2019-02-03 DIAGNOSIS — I10 Essential (primary) hypertension: Secondary | ICD-10-CM

## 2019-02-03 DIAGNOSIS — IMO0001 Reserved for inherently not codable concepts without codable children: Secondary | ICD-10-CM

## 2019-02-03 DIAGNOSIS — E785 Hyperlipidemia, unspecified: Secondary | ICD-10-CM

## 2019-02-03 DIAGNOSIS — M4726 Other spondylosis with radiculopathy, lumbar region: Secondary | ICD-10-CM

## 2019-02-05 ENCOUNTER — Other Ambulatory Visit: Payer: Self-pay

## 2019-02-05 DIAGNOSIS — J3089 Other allergic rhinitis: Secondary | ICD-10-CM

## 2019-02-05 MED ORDER — FLUTICASONE PROPIONATE 50 MCG/ACT NA SUSP: 1.0000 | Freq: Every day | NASAL | 0 refills | Status: DC

## 2019-02-05 MED ORDER — ALBUTEROL SULFATE HFA 108 (90 BASE) MCG/ACT IN AERS: 1.0000 | INHALATION_SPRAY | Freq: Four times a day (QID) | RESPIRATORY_TRACT | 0 refills | Status: DC | PRN

## 2019-02-23 ENCOUNTER — Ambulatory Visit: Payer: Medicare HMO

## 2019-03-16 ENCOUNTER — Emergency Department
Admission: EM | Admit: 2019-03-16 | Discharge: 2019-03-16 | Disposition: A | Discharge status: Home / Self Care | Payer: Medicare Other | Attending: Emergency Medicine | Admitting: Emergency Medicine

## 2019-03-16 ENCOUNTER — Other Ambulatory Visit: Payer: Self-pay

## 2019-03-16 ENCOUNTER — Emergency Department: Payer: Medicare Other

## 2019-03-16 ENCOUNTER — Encounter: Payer: Self-pay | Admitting: Emergency Medicine

## 2019-03-16 DIAGNOSIS — R51 Headache: Secondary | ICD-10-CM | POA: Insufficient documentation

## 2019-03-16 DIAGNOSIS — Z79899 Other long term (current) drug therapy: Secondary | ICD-10-CM | POA: Diagnosis not present

## 2019-03-16 DIAGNOSIS — E119 Type 2 diabetes mellitus without complications: Secondary | ICD-10-CM | POA: Diagnosis not present

## 2019-03-16 DIAGNOSIS — Z7982 Long term (current) use of aspirin: Secondary | ICD-10-CM | POA: Insufficient documentation

## 2019-03-16 DIAGNOSIS — Z8673 Personal history of transient ischemic attack (TIA), and cerebral infarction without residual deficits: Secondary | ICD-10-CM | POA: Insufficient documentation

## 2019-03-16 DIAGNOSIS — I10 Essential (primary) hypertension: Secondary | ICD-10-CM | POA: Diagnosis not present

## 2019-03-16 DIAGNOSIS — Z87891 Personal history of nicotine dependence: Secondary | ICD-10-CM | POA: Diagnosis not present

## 2019-03-16 DIAGNOSIS — Z7984 Long term (current) use of oral hypoglycemic drugs: Secondary | ICD-10-CM | POA: Diagnosis not present

## 2019-03-16 DIAGNOSIS — R519 Headache, unspecified: Secondary | ICD-10-CM

## 2019-03-16 HISTORY — DX: Cerebral infarction, unspecified: I63.9

## 2019-03-16 MED ORDER — BUTALBITAL-APAP-CAFFEINE 50-325-40 MG PO TABS
1.0000 | ORAL_TABLET | Freq: Once | ORAL | Status: AC
  Administered 2019-03-16: 18:00:00 1 via ORAL
  Filled 2019-03-16: qty 1

## 2019-03-16 NOTE — ED Notes (Signed)
At bedside with Dr. Jari Pigg who offered multiple tests to patient after head CT came back unremarkable. Patient declined anymore tests and wanted to be discharged.

## 2019-03-16 NOTE — Discharge Instructions (Signed)
Your CT was reassuring.  We recommended LP or CTA to rule out subarachnoid hemorrhage.  You declined.  You should return to the ER if your symptoms are worsening or you would like further work-up.

## 2019-03-16 NOTE — ED Notes (Signed)
Patient c/o having a headache today before coming to the ER. Reported to this RN he no longer had a headache and wanted to go home. Denies any dizziness. Reports he came to the ER to be seen because of his "worrisome wife"

## 2019-03-16 NOTE — ED Provider Notes (Signed)
[facility] Emergency Department Provider Note  ____________________________________________   First MD Initiated Contact with Patient 03/16/19 1711     (approximate)  I have reviewed the triage vital signs and the nursing notes.   HISTORY  Chief Complaint Headache    HPI Bruce Richardson is a 68 y.o. male with diabetes, hypertension, stroke who presents with headache.  Patient endorses having a headache that started yesterday.  Patient endorses that he had the worst headache of his life.  Is on the left frontal headache.  Denies any vision changes.  It was constant, better on its own.  Today the headache is minimal but his wife insisted that he come came in and have it evaluated. No fevers, no neck stiffness.           Past Medical History:  Diagnosis Date  . Arthritis   . Bulging lumbar disc    lower back  . Complete rotator cuff rupture of left shoulder 09/23/2014  . Complication of anesthesia    kept moving while under anesthesia during colonoscopy, hard to put under.  . Diabetes mellitus without complication ([place])   . GERD (gastroesophageal reflux disease)   . Hypertension   . Injury of tendon of upper extremity 12/23/2014  . Non-retracting foreskin 07/04/2015  . Stroke [facility])     Patient Active Problem List   Diagnosis Date Noted  . Centrilobular emphysema ([place]) 01/26/2019  . Aortic atherosclerosis ([place]) 01/26/2019  . Echocardiogram shows left ventricular diastolic dysfunction 36/46/8032  . Moderate episode of recurrent major depressive disorder ([place]) 02/16/2018  . Erectile dysfunction 02/16/2018  . History of CVA (cerebrovascular accident) without residual deficits 08/13/2017  . BPH without obstruction/lower urinary tract symptoms 03/19/2017  . Chronic bilateral low back pain 11/14/2016  . 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 GERD (gastroesophageal reflux disease) 04/11/2016  . Obesity 04/11/2016  . Allergic  rhinitis 08/04/2015  . Hypertension 07/04/2015  . Hyperlipidemia due to type 2 diabetes mellitus ([place]) 07/04/2015  . Type 2 diabetes mellitus with other specified complication ([place]) 08/11/8249  . Tendinitis of elbow or forearm 04/26/2015  . History of repair of rotator cuff 08/31/2014    Past Surgical History:  Procedure Laterality Date  . CIRCUMCISION N/A 07/24/2015   Procedure: CIRCUMCISION ADULT;  Surgeon: Nickie Retort, MD;  Location: [facility] ORS;  Service: Urology;  Laterality: N/A;  . EYE SURGERY Right    right eye cataract surgery   . SHOULDER ARTHROSCOPY Bilateral   . SHOULDER ARTHROSCOPY Right 12/22/2014   Procedure: RIGHT SHOULDER ARTHROSCOPY /DECOMPRESSION/ROTATOR CUFF REPAIR OF RECURRENT ROTATOR CUFF TEAR;  Surgeon: Corky Mull, MD;  Location: [facility] ORS;  Service: Orthopedics;  Laterality: Right;    Prior to Admission medications   Medication Sig Start Date End Date Taking? Authorizing Provider  ACCU-CHEK SOFTCLIX LANCETS lancets 100 each by Other route 2 (two) times daily. for testing 12/12/17   Olin Hauser, DO  albuterol (VENTOLIN HFA) 108 (90 Base) MCG/ACT inhaler Inhale 1-2 puffs into the lungs every 6 (six) hours as needed for wheezing or shortness of breath. 02/05/19   Glean Hess, MD  aspirin EC 81 MG tablet Take 1 tablet (81 mg total) by mouth daily. 01/29/17   Karamalegos, Devonne Doughty, DO  atorvastatin (LIPITOR) 80 MG tablet Take 1 tablet (80 mg total) by mouth every morning. 01/26/19   Glean Hess, MD  Blood Glucose Monitoring Suppl (ONE TOUCH ULTRA MINI) w/Device KIT 1 kit by  Does not apply route as directed. 12/12/17   Karamalegos, Devonne Doughty, DO  fluticasone (FLONASE) 50 MCG/ACT nasal spray Place 1 spray into both nostrils daily. 02/05/19 05/06/19  Glean Hess, MD  glucose blood test strip Check blood sugar twice daily. Dx.E11.9 12/12/17   Karamalegos, Devonne Doughty, DO  Insulin Pen Needle (PEN NEEDLES) 32G X 4 MM MISC 1 Device by Does not  apply route every 7 (seven) days. 07/10/15   Roselee Nova, MD  lisinopril (ZESTRIL) 40 MG tablet Take 1 tablet (40 mg total) by mouth daily. 01/26/19   Glean Hess, MD  metFORMIN (GLUCOPHAGE) 1000 MG tablet Take 1 tablet (1,000 mg total) by mouth 2 (two) times daily with a meal. 01/26/19   Glean Hess, MD  naproxen (NAPROSYN) 500 MG tablet Take 1 tablet (500 mg total) by mouth 2 (two) times daily with a meal. For 2-4 weeks then as needed 01/26/19   Glean Hess, MD  [facility] DELICA LANCETS 44W MISC 1 each by Does not apply route 2 (two) times daily. Dx: E11.9 04/30/16   Karamalegos, Devonne Doughty, DO  pantoprazole (PROTONIX) 40 MG tablet Take 1 tablet (40 mg total) by mouth daily. 01/26/19   Glean Hess, MD  tamsulosin (FLOMAX) 0.4 MG CAPS capsule Take 1 capsule (0.4 mg total) by mouth daily. For prostate 01/26/19   Glean Hess, MD    Allergies Patient has no known allergies.  Family History  Problem Relation Age of Onset  . Alzheimer's disease Mother   . Diabetes Mother   . Hypertension Mother   . Cancer Father   . Heart failure Brother   . Diabetes Brother   . Hypertension Brother   . Heart disease Brother   . Prostate cancer Other        unknown  . Bladder Cancer Other        unknown  . Heart disease Son   . Diabetes Sister     Social History Social History   Tobacco Use  . Smoking status: Former Smoker    Packs/day: 1.50    Years: 25.00    Pack years: 37.50    Types: Cigarettes    Start date: 12/19/2001    Quit date: 07/03/2002    Years since quitting: 16.7  . Smokeless tobacco: Former Systems developer  . Tobacco comment: quit smoking in 06/2001  Substance Use Topics  . Alcohol use: No    Alcohol/week: 0.0 standard drinks  . Drug use: No      Review of Systems Constitutional: No fever/chills Eyes: No visual changes. ENT: No sore throat. Cardiovascular: Denies chest pain. Respiratory: Denies shortness of breath. Gastrointestinal: No abdominal pain.   No nausea, no vomiting.  No diarrhea.  No constipation. Genitourinary: Negative for dysuria. Musculoskeletal: Negative for back pain. Skin: Negative for rash. Neurological: Positive for headache focal weakness or numbness. All other ROS negative ____________________________________________   PHYSICAL EXAM:  VITAL SIGNS: ED Triage Vitals  Enc Vitals Group     BP 03/16/19 1529 (!) 160/98     Pulse Rate 03/16/19 1529 98     Resp 03/16/19 1529 16     Temp 03/16/19 1529 98.4 F (36.9 C)     Temp Source 03/16/19 1529 Oral     SpO2 03/16/19 1529 97 %     Weight 03/16/19 1523 238 lb 15.7 oz (108.4 kg)     Height --      Head Circumference --  Peak Flow --      Pain Score 03/16/19 1522 4     Pain Loc --      Pain Edu? --      Excl. in [place]? --     Constitutional: Alert and oriented. Well appearing and in no acute distress. Eyes: Conjunctivae are normal. EOMI. Head: Atraumatic. Nose: No congestion/rhinnorhea. Mouth/Throat: Mucous membranes are moist.   Neck: No stridor. Trachea Midline. FROM Cardiovascular: Normal rate, regular rhythm. Grossly normal heart sounds.  Good peripheral circulation. Respiratory: Normal respiratory effort.  No retractions. Lungs CTAB. Gastrointestinal: Soft and nontender. No distention. No abdominal bruits.  Musculoskeletal: No lower extremity tenderness nor edema.  No joint effusions. Neurologic:  Normal speech and language. No gross focal neurologic deficits are appreciated.  Skin:  Skin is warm, dry and intact. No rash noted. Psychiatric: Mood and affect are normal. Speech and behavior are normal. GU: Deferred    RADIOLOGY   Official radiology report(s): Ct Head Wo Contrast  Result Date: 03/16/2019 CLINICAL DATA:  C/O left forehead pain x 2-3 days. Also c/o intermittent dizziness with symptoms.Patient states headache was better this morning but returned this afternoon. Denies c/o dizziness. EXAM: CT HEAD WITHOUT CONTRAST TECHNIQUE: Contiguous  axial images were obtained from the base of the skull through the vertex without intravenous contrast. COMPARISON:  Head CT, 08/13/2017 FINDINGS: Brain: No evidence of acute infarction, hemorrhage, hydrocephalus, extra-axial collection or mass lesion/mass effect. Vascular: Patchy bilateral white matter hypoattenuation is noted consistent with mild chronic microvascular ischemic change. Skull: Normal. Negative for fracture or focal lesion. Sinuses/Orbits: Visualized globes and orbits are unremarkable. The visualized sinuses and mastoid air cells are clear. Other: None. IMPRESSION: 1. No acute intracranial abnormalities. 2. Mild chronic microvascular ischemic change. Stable appearance from the prior head CT Electronically Signed   By: Lajean Manes M.D.   On: 03/16/2019 17:56    ____________________________________________   PROCEDURES  Procedure(s) performed (including Critical Care):  Procedures   ____________________________________________   INITIAL IMPRESSION / ASSESSMENT AND PLAN / ED COURSE  Bruce Richardson was evaluated in Emergency Department on 03/16/2019 for the symptoms described in the history of present illness. He was evaluated in the context of the global COVID-19 pandemic, which necessitated consideration that the patient might be at risk for infection with the SARS-CoV-2 virus that causes COVID-19. Institutional protocols and algorithms that pertain to the evaluation of patients at risk for COVID-19 are in a state of rapid change based on information released by regulatory bodies including the CDC and federal and state organizations. These policies and algorithms were followed during the patient's care in the ED.     Discussed with patient my concern for subarachnoid hemorrhage given him describing it as worst headache of his life.  I stressed that the gold standard of diagnosis is a CT plus LP.  Patient declined LP.  Then I offered CT plus CT angio to rule out aneurysm.  Patient  again said he did not want to have a IV placed.  He says that if it is time to die that he understands that that is what god would have wanted.  He understands the risk of not having these procedures done are permanent disability and/or death.  He says that if his symptoms are worsening than he would come back.  At this time he is only open to getting a CT scan and does not want any further interventions done. No evidence of stroke based upon examination.  Pupils reactive and low  suspicion for glaucoma.  6:10 PM reevaluated patient and explained that his CT scan was negative.  Patient would like to go home at this time.  He still declines further work-up.  He understands the risk of leaving including death and disability.  He says that he will return if his symptoms are worsening and he decides he wants additional testing.   I discussed the provisional nature of ED diagnosis, the treatment so far, the ongoing plan of care, follow up appointments and return precautions with the patient and any family or support people present. They expressed understanding and agreed with the plan, discharged home.    ____________________________________________   FINAL CLINICAL IMPRESSION(S) / ED DIAGNOSES   Final diagnoses:  Bad headache      MEDICATIONS GIVEN DURING THIS VISIT:  Medications  butalbital-acetaminophen-caffeine (FIORICET) 50-325-40 MG per tablet 1 tablet (1 tablet Oral Given 03/16/19 1754)     ED Discharge Orders    None       Note:  This document was prepared using Dragon voice recognition software and may include unintentional dictation errors.   Vanessa [place], MD 03/16/19 [phone]

## 2019-03-16 NOTE — ED Triage Notes (Addendum)
C/O left forehead pain x 2-3 days.  Also c/o intermittent dizziness with symptoms.  Patient states headache was better this morning but returned this afternoon.  Denies c/o dizziness.

## 2019-03-17 ENCOUNTER — Encounter: Payer: Self-pay | Admitting: Emergency Medicine

## 2019-03-17 ENCOUNTER — Emergency Department
Admission: EM | Admit: 2019-03-17 | Discharge: 2019-03-17 | Disposition: A | Discharge status: Home / Self Care | Payer: Medicare Other | Attending: Emergency Medicine | Admitting: Emergency Medicine

## 2019-03-17 ENCOUNTER — Emergency Department: Payer: Medicare Other

## 2019-03-17 ENCOUNTER — Telehealth: Payer: Self-pay

## 2019-03-17 ENCOUNTER — Other Ambulatory Visit: Payer: Self-pay

## 2019-03-17 DIAGNOSIS — R51 Headache: Secondary | ICD-10-CM | POA: Insufficient documentation

## 2019-03-17 DIAGNOSIS — Z79899 Other long term (current) drug therapy: Secondary | ICD-10-CM | POA: Diagnosis not present

## 2019-03-17 DIAGNOSIS — I10 Essential (primary) hypertension: Secondary | ICD-10-CM | POA: Insufficient documentation

## 2019-03-17 DIAGNOSIS — Z87891 Personal history of nicotine dependence: Secondary | ICD-10-CM | POA: Diagnosis not present

## 2019-03-17 DIAGNOSIS — Z794 Long term (current) use of insulin: Secondary | ICD-10-CM | POA: Diagnosis not present

## 2019-03-17 DIAGNOSIS — Z8673 Personal history of transient ischemic attack (TIA), and cerebral infarction without residual deficits: Secondary | ICD-10-CM | POA: Diagnosis not present

## 2019-03-17 DIAGNOSIS — Z7982 Long term (current) use of aspirin: Secondary | ICD-10-CM | POA: Diagnosis not present

## 2019-03-17 DIAGNOSIS — R519 Headache, unspecified: Secondary | ICD-10-CM

## 2019-03-17 DIAGNOSIS — E119 Type 2 diabetes mellitus without complications: Secondary | ICD-10-CM | POA: Diagnosis not present

## 2019-03-17 LAB — COMPREHENSIVE METABOLIC PANEL
ALT: 35 U/L (ref 0–44)
AST: 38 U/L (ref 15–41)
Albumin: 4.3 g/dL (ref 3.5–5.0)
Alkaline Phosphatase: 75 U/L (ref 38–126)
Anion gap: 12 (ref 5–15)
BUN: 20 mg/dL (ref 8–23)
CO2: 25 mmol/L (ref 22–32)
Calcium: 8.8 mg/dL — ABNORMAL LOW (ref 8.9–10.3)
Chloride: 100 mmol/L (ref 98–111)
Creatinine, Ser: 1.05 mg/dL (ref 0.61–1.24)
GFR calc Af Amer: >60 mL/min (ref >60)
GFR calc non Af Amer: >60 mL/min (ref >60)
Glucose, Bld: 149 mg/dL — ABNORMAL HIGH (ref 70–99)
Potassium: 5.2 mmol/L — ABNORMAL HIGH (ref 3.5–5.1)
Sodium: 137 mmol/L (ref 135–145)
Total Bilirubin: 1.7 mg/dL — ABNORMAL HIGH (ref 0.3–1.2)
Total Protein: 8.3 g/dL — ABNORMAL HIGH (ref 6.5–8.1)

## 2019-03-17 LAB — CBC
HCT: 48.4 % (ref 39.0–52.0)
Hemoglobin: 16.1 g/dL (ref 13.0–17.0)
MCH: 28.4 pg (ref 26.0–34.0)
MCHC: 33.3 g/dL (ref 30.0–36.0)
MCV: 85.5 fL (ref 80.0–100.0)
Platelets: 263 10*3/uL (ref 150–400)
RBC: 5.66 MIL/uL (ref 4.22–5.81)
RDW: 12.3 % (ref 11.5–15.5)
WBC: 8.4 10*3/uL (ref 4.0–10.5)
nRBC: 0 % (ref 0.0–0.2)

## 2019-03-17 MED ORDER — BUTALBITAL-APAP-CAFFEINE 50-325-40 MG PO TABS
1.0000 | ORAL_TABLET | Freq: Once | ORAL | Status: AC
  Administered 2019-03-17: 1 via ORAL
  Filled 2019-03-17: qty 1

## 2019-03-17 MED ORDER — IOHEXOL 350 MG/ML SOLN
75.0000 mL | Freq: Once | INTRAVENOUS | Status: AC | PRN
  Administered 2019-03-17: 15:00:00 75 mL via INTRAVENOUS

## 2019-03-17 MED ORDER — BUTALBITAL-APAP-CAFFEINE 50-325-40 MG PO TABS: 1.0000 | ORAL_TABLET | Freq: Four times a day (QID) | ORAL | 0 refills | Status: DC | PRN

## 2019-03-17 NOTE — ED Notes (Signed)
Pt states this RN that he has a pocket knife, this RN offered to have [employer] lock it up with a sticker, pt refused stating he will put it in his car. On the way back to lobby pt asked this RN, "I had it all day yesterday and nobody said anything so why can't I have it today". This RN explained the no weapons policy, pt states "Well how are you going to know if I acutally put it in my car?" Pt ambulatory to his car to put his pocket knife in the car, [place], First RN aware.

## 2019-03-17 NOTE — ED Notes (Signed)
First RN Note: Pt presents to ED via POV with c/o continued HA. Per chart patient was seen yesterday for same with unremarkable head CT and refused further testing. Pt presents to ED with c/o continued testing. Pt ambulatory without difficulty at this time, A&O x4.

## 2019-03-17 NOTE — ED Notes (Signed)
PT asking where restroom is. This RN asked pt to wait while I get a cup for urine sample. PT asks if urine sample is needed, RN states she does not know but better safe than sorry if urine sample were to be ordered after empty bladder. PT states he will not provide sample until it is needed. PT shown to closest restroom

## 2019-03-17 NOTE — Telephone Encounter (Signed)
Patient called for advice. Michela Pitcher he went to ER for bad headache yesterday. They told him to come back today and have needle put in his back with dye put in to help with headaches?  Wants your opinion if he should do this or not?  Please Advise.

## 2019-03-17 NOTE — ED Notes (Signed)
Spoke with Dr. Burlene Arnt, no new orders.

## 2019-03-17 NOTE — ED Provider Notes (Signed)
CTA head without any aneurysm. Discussed finding with patient. Discussed with patient importance of follow up. Will give neurology follow up information.   Nance Pear, MD 03/17/19 [phone]

## 2019-03-17 NOTE — ED Notes (Signed)
Pt back from CT

## 2019-03-17 NOTE — ED Notes (Signed)
CT called back and stated that no lab work was done- Dr Kerman Passey aware

## 2019-03-17 NOTE — Discharge Instructions (Signed)
Please seek medical attention for any high fevers, chest pain, shortness of breath, change in behavior, persistent vomiting, bloody stool or any other new or concerning symptoms.  

## 2019-03-17 NOTE — ED Notes (Signed)
Pt is refusing to sit on stretcher. Pt is walking around hallway, peering in other pt rooms. Pt asked by this RN what he is doing. Pt responded in rude way stating "I wasn't looking in rooms". Pt asked to sit back on stretcher, refused to do so, kept pacing in hall. Pt stated that he wasn't going to fall. Pt asked for a chair to sit on. This RN provided chair for pt, pt then stated "I was going to play a trick on you and lay on the floor." pt informed that is inappropriate behavior.

## 2019-03-17 NOTE — ED Provider Notes (Signed)
[facility] Emergency Department Provider Note  Time seen: 2:27 PM  I have reviewed the triage vital signs and the nursing notes.   HISTORY  Chief Complaint Headache   HPI Bruce Richardson is a 68 y.o. male with a past medical history of arthritis, gastric reflux, diabetes, hypertension, presents to the emergency department for a headache.  According to the patient for the past 2 days he is woken up with a left-sided headache.  States it is worse at night and then goes away slowly during the day.  Denies any headache at this time.  Patient states it is mostly when he lays down at night.  Denies any nausea or vomiting.  Denies any recent fever cough or congestion.  Patient was seen in the emergency department yesterday for the headache.  Had a CT scan that was negative but refused any further work-up such as CT angiography.  Wanted to speak to his doctor first.  Patient states he was able to speak to his doctor today who recommended that he follow back up for the CT angiography of the head.  Patient's lab work yesterday was normal.  CT scan without contrast was normal.  Overall the patient appears well, no acute distress.   Past Medical History:  Diagnosis Date  . Arthritis   . Bulging lumbar disc    lower back  . Complete rotator cuff rupture of left shoulder 09/23/2014  . Complication of anesthesia    kept moving while under anesthesia during colonoscopy, hard to put under.  . Diabetes mellitus without complication ([place])   . GERD (gastroesophageal reflux disease)   . Hypertension   . Injury of tendon of upper extremity 12/23/2014  . Non-retracting foreskin 07/04/2015  . Stroke [facility])     Patient Active Problem List   Diagnosis Date Noted  . Centrilobular emphysema ([place]) 01/26/2019  . Aortic atherosclerosis ([place]) 01/26/2019  . Echocardiogram shows left ventricular diastolic dysfunction 51/70/0174  . Moderate episode of recurrent major depressive disorder ([place])  02/16/2018  . Erectile dysfunction 02/16/2018  . History of CVA (cerebrovascular accident) without residual deficits 08/13/2017  . BPH without obstruction/lower urinary tract symptoms 03/19/2017  . Chronic bilateral low back pain 11/14/2016  . 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 GERD (gastroesophageal reflux disease) 04/11/2016  . Obesity 04/11/2016  . Allergic rhinitis 08/04/2015  . Hypertension 07/04/2015  . Hyperlipidemia due to type 2 diabetes mellitus ([place]) 07/04/2015  . Type 2 diabetes mellitus with other specified complication ([place]) 94/49/6759  . Tendinitis of elbow or forearm 04/26/2015  . History of repair of rotator cuff 08/31/2014    Past Surgical History:  Procedure Laterality Date  . CIRCUMCISION N/A 07/24/2015   Procedure: CIRCUMCISION ADULT;  Surgeon: Nickie Retort, MD;  Location: [facility] ORS;  Service: Urology;  Laterality: N/A;  . EYE SURGERY Right    right eye cataract surgery   . SHOULDER ARTHROSCOPY Bilateral   . SHOULDER ARTHROSCOPY Right 12/22/2014   Procedure: RIGHT SHOULDER ARTHROSCOPY /DECOMPRESSION/ROTATOR CUFF REPAIR OF RECURRENT ROTATOR CUFF TEAR;  Surgeon: Corky Mull, MD;  Location: [facility] ORS;  Service: Orthopedics;  Laterality: Right;    Prior to Admission medications   Medication Sig Start Date End Date Taking? Authorizing Provider  ACCU-CHEK SOFTCLIX LANCETS lancets 100 each by Other route 2 (two) times daily. for testing 12/12/17   Olin Hauser, DO  albuterol (VENTOLIN HFA) 108 (90 Base) MCG/ACT inhaler Inhale 1-2 puffs into the lungs every 6 (  six) hours as needed for wheezing or shortness of breath. 02/05/19   Glean Hess, MD  aspirin EC 81 MG tablet Take 1 tablet (81 mg total) by mouth daily. 01/29/17   Karamalegos, Devonne Doughty, DO  atorvastatin (LIPITOR) 80 MG tablet Take 1 tablet (80 mg total) by mouth every morning. 01/26/19   Glean Hess, MD  Blood Glucose Monitoring Suppl (ONE TOUCH ULTRA MINI)  w/Device KIT 1 kit by Does not apply route as directed. 12/12/17   Karamalegos, Devonne Doughty, DO  fluticasone (FLONASE) 50 MCG/ACT nasal spray Place 1 spray into both nostrils daily. 02/05/19 05/06/19  Glean Hess, MD  glucose blood test strip Check blood sugar twice daily. Dx.E11.9 12/12/17   Karamalegos, Devonne Doughty, DO  Insulin Pen Needle (PEN NEEDLES) 32G X 4 MM MISC 1 Device by Does not apply route every 7 (seven) days. 07/10/15   Roselee Nova, MD  lisinopril (ZESTRIL) 40 MG tablet Take 1 tablet (40 mg total) by mouth daily. 01/26/19   Glean Hess, MD  metFORMIN (GLUCOPHAGE) 1000 MG tablet Take 1 tablet (1,000 mg total) by mouth 2 (two) times daily with a meal. 01/26/19   Glean Hess, MD  naproxen (NAPROSYN) 500 MG tablet Take 1 tablet (500 mg total) by mouth 2 (two) times daily with a meal. For 2-4 weeks then as needed 01/26/19   Glean Hess, MD  [facility] DELICA LANCETS 01B MISC 1 each by Does not apply route 2 (two) times daily. Dx: E11.9 04/30/16   Karamalegos, Devonne Doughty, DO  pantoprazole (PROTONIX) 40 MG tablet Take 1 tablet (40 mg total) by mouth daily. 01/26/19   Glean Hess, MD  tamsulosin (FLOMAX) 0.4 MG CAPS capsule Take 1 capsule (0.4 mg total) by mouth daily. For prostate 01/26/19   Glean Hess, MD    No Known Allergies  Family History  Problem Relation Age of Onset  . Alzheimer's disease Mother   . Diabetes Mother   . Hypertension Mother   . Cancer Father   . Heart failure Brother   . Diabetes Brother   . Hypertension Brother   . Heart disease Brother   . Prostate cancer Other        unknown  . Bladder Cancer Other        unknown  . Heart disease Son   . Diabetes Sister     Social History Social History   Tobacco Use  . Smoking status: Former Smoker    Packs/day: 1.50    Years: 25.00    Pack years: 37.50    Types: Cigarettes    Start date: 12/19/2001    Quit date: 07/03/2002    Years since quitting: 16.7  . Smokeless tobacco:  Former Systems developer  . Tobacco comment: quit smoking in 06/2001  Substance Use Topics  . Alcohol use: No    Alcohol/week: 0.0 standard drinks  . Drug use: No    Review of Systems Constitutional: Negative for fever. Cardiovascular: Negative for chest pain. Respiratory: Negative for shortness of breath. Gastrointestinal: Negative for abdominal pain, vomiting Musculoskeletal: Negative for musculoskeletal complaints Skin: Negative for skin complaints  Neurological: Headache over the past 2 days in the morning, goes away by the evening. All other ROS negative  ____________________________________________   PHYSICAL EXAM:  VITAL SIGNS: ED Triage Vitals  Enc Vitals Group     BP 03/17/19 1218 (!) 158/76     Pulse Rate 03/17/19 1218 95     Resp  03/17/19 1218 18     Temp 03/17/19 1218 98.8 F (37.1 C)     Temp Source 03/17/19 1218 Oral     SpO2 03/17/19 1218 95 %     Weight 03/17/19 1219 238 lb 15.7 oz (108.4 kg)     Height 03/17/19 1219 _0  (1.803 m)     Head Circumference --      Peak Flow --      Pain Score 03/17/19 1218 0     Pain Loc --      Pain Edu? --      Excl. in [place]? --     Constitutional: Alert and oriented. Well appearing and in no distress. Eyes: Normal exam ENT      Head: Normocephalic and atraumatic.      Mouth/Throat: Mucous membranes are moist. Cardiovascular: Normal rate, regular rhythm. No murmur Respiratory: Normal respiratory effort without tachypnea nor retractions. Breath sounds are clear  Gastrointestinal: Soft and nontender. No distention.  Musculoskeletal: Nontender with normal range of motion in all extremities.  Neurologic:  Normal speech and language. No gross focal neurologic deficits Skin:  Skin is warm, dry and intact.  Psychiatric: Mood and affect are normal.   ____________________________________________   RADIOLOGY  CTA pending  ____________________________________________   INITIAL IMPRESSION / ASSESSMENT AND PLAN / ED  COURSE  Pertinent labs & imaging results that were available during my care of the patient were reviewed by me and considered in my medical decision making (see chart for details).   Patient presents to the emergency department for a headache for the past 2 days.  States the headache is worse in the morning and goes away by the evening.  Denies any headache at this time.  Differential would include headache, migraine, tension headache, tumor/mass, ICH or aneurysm.  Overall the patient appears well.  We will check labs, I discussed the pros and cons of CT angiography with the patient, he is agreeable to CTA of the head to further evaluate.  Patient is already asking how long it will be before he is able to leave.  Denies any headache at this time.  CTA pending.  Patient care signed out to oncoming physician.  DAXON KYNE was evaluated in Emergency Department on 03/17/2019 for the symptoms described in the history of present illness. He was evaluated in the context of the global COVID-19 pandemic, which necessitated consideration that the patient might be at risk for infection with the SARS-CoV-2 virus that causes COVID-19. Institutional protocols and algorithms that pertain to the evaluation of patients at risk for COVID-19 are in a state of rapid change based on information released by regulatory bodies including the CDC and federal and state organizations. These policies and algorithms were followed during the patient's care in the ED.  ____________________________________________   FINAL CLINICAL IMPRESSION(S) / ED DIAGNOSES  Headache   Harvest Dark, MD 03/18/19 2230

## 2019-03-17 NOTE — ED Notes (Signed)
Called CT and let them know that pt was ready

## 2019-03-17 NOTE — ED Triage Notes (Signed)
See this RN's first RN Note. Pt states HA resolved yesterday but returned last night, pt also c/o worsening pain to L side of his head with cough. Pt states wanted to speak with PCP before any images required contrast were done but has not been able to speak to PCP. Pt denies HA at this time, states "it disappears every morning and comes back every night".

## 2019-03-17 NOTE — ED Notes (Signed)
Pt in CT.

## 2019-03-17 NOTE — ED Notes (Signed)
Dr Kerman Passey at bedside to talk with pt

## 2019-03-17 NOTE — Telephone Encounter (Signed)
Called and left detailed message regarding this. Told to call if any more questions.

## 2019-03-17 NOTE — ED Notes (Signed)
Pt refused discharge vital signs- pt could not sign for discharge as there is no signature pad- pt verbalized understanding of discharge, follow up, and prescriptions

## 2019-03-17 NOTE — Telephone Encounter (Signed)
If the headache is better and since the CT was normal, he may not need any further tests.  If the headaches becomes severe again, he should let the ED do any tests that they believe are necessary.

## 2019-03-26 ENCOUNTER — Other Ambulatory Visit: Payer: Self-pay

## 2019-03-28 ENCOUNTER — Other Ambulatory Visit: Payer: Self-pay | Admitting: Internal Medicine

## 2019-03-28 DIAGNOSIS — J3089 Other allergic rhinitis: Secondary | ICD-10-CM

## 2019-04-02 ENCOUNTER — Ambulatory Visit (INDEPENDENT_AMBULATORY_CARE_PROVIDER_SITE_OTHER): Payer: Medicare Other | Admitting: Internal Medicine

## 2019-04-02 ENCOUNTER — Other Ambulatory Visit: Payer: Self-pay

## 2019-04-02 ENCOUNTER — Encounter: Payer: Self-pay | Admitting: Internal Medicine

## 2019-04-02 VITALS — BP 134/70 | HR 87 | Ht 71.0 in | Wt 234.0 lb

## 2019-04-02 DIAGNOSIS — I700 Atherosclerosis of aorta: Secondary | ICD-10-CM

## 2019-04-02 DIAGNOSIS — F331 Major depressive disorder, recurrent, moderate: Secondary | ICD-10-CM | POA: Diagnosis not present

## 2019-04-02 DIAGNOSIS — E785 Hyperlipidemia, unspecified: Secondary | ICD-10-CM

## 2019-04-02 DIAGNOSIS — I10 Essential (primary) hypertension: Secondary | ICD-10-CM

## 2019-04-02 DIAGNOSIS — G4489 Other headache syndrome: Secondary | ICD-10-CM | POA: Diagnosis not present

## 2019-04-02 DIAGNOSIS — N400 Enlarged prostate without lower urinary tract symptoms: Secondary | ICD-10-CM

## 2019-04-02 DIAGNOSIS — K219 Gastro-esophageal reflux disease without esophagitis: Secondary | ICD-10-CM

## 2019-04-02 DIAGNOSIS — E1169 Type 2 diabetes mellitus with other specified complication: Secondary | ICD-10-CM | POA: Diagnosis not present

## 2019-04-02 MED ORDER — ATORVASTATIN CALCIUM 80 MG PO TABS: 80.0000 mg | ORAL_TABLET | Freq: Every morning | ORAL | 1 refills | Status: DC

## 2019-04-02 MED ORDER — PANTOPRAZOLE SODIUM 40 MG PO TBEC: 40.0000 mg | DELAYED_RELEASE_TABLET | Freq: Every day | ORAL | 1 refills | Status: DC

## 2019-04-02 MED ORDER — LISINOPRIL 40 MG PO TABS: 40.0000 mg | ORAL_TABLET | Freq: Every day | ORAL | 1 refills | Status: DC

## 2019-04-02 MED ORDER — TAMSULOSIN HCL 0.4 MG PO CAPS: 0.4000 mg | ORAL_CAPSULE | Freq: Every day | ORAL | 1 refills | Status: DC

## 2019-04-02 MED ORDER — METFORMIN HCL 1000 MG PO TABS: 1000.0000 mg | ORAL_TABLET | Freq: Two times a day (BID) | ORAL | 1 refills | Status: DC

## 2019-04-02 NOTE — Progress Notes (Signed)
Date:  04/02/2019   Name:  Bruce Richardson   DOB:  1950/10/29   MRN:  [id number]   Chief Complaint: Hypertension (Follow up.), Diabetes (Foot Exam.), and Depression (10- PHQ9)  Hypertension This is a chronic problem. The problem is unchanged. The problem is controlled. Pertinent negatives include no chest pain, headaches, palpitations or shortness of breath. There are no compliance problems.   Diabetes He presents for his follow-up diabetic visit. He has type 2 diabetes mellitus. His disease course has been stable. There are no hypoglycemic associated symptoms. Pertinent negatives for hypoglycemia include no dizziness, headaches, nervousness/anxiousness or tremors. Pertinent negatives for diabetes include no chest pain, no fatigue, no polydipsia, no polyuria, no visual change and no weakness. Current diabetic treatment includes oral agent (monotherapy). His weight is stable. He monitors blood glucose at home 1-2 x per week. His breakfast blood glucose is taken between 6-7 am. His breakfast blood glucose range is generally 130-140 mg/dl.  Depression        This is a chronic (per patient, he has been this way all his life) problem.  The problem occurs every several days.The problem is unchanged.  Associated symptoms include irritable and decreased interest.  Associated symptoms include no fatigue, no restlessness, no appetite change, no headaches, not sad and no suicidal ideas.     The symptoms are aggravated by social issues and family issues.  Past treatments include nothing (he is not interested in medications). Headache  This is a new (seen twice in ED for severe headache. CT normal.  Treated with 3 days of butalbital every 6 hours - headache is gone and has not recurrend) problem. The problem has been resolved. The pain is located in the left unilateral region. The quality of the pain is described as band-like. The pain is moderate. Pertinent negatives include no abdominal pain, coughing,  dizziness, hearing loss, numbness, visual change or weakness. Treatments tried: fioricet. The treatment provided significant (He is seeing Neurology for follow up next month) relief. His past medical history is significant for hypertension.   Pt requests that all medications be refilled at [facility] mail order pharmacy.  Lab Results  Component Value Date   HGBA1C 7.9 (H) 08/24/2018   Lab Results  Component Value Date   CREATININE 1.05 03/17/2019   BUN 20 03/17/2019   NA 137 03/17/2019   K 5.2 (H) 03/17/2019   CL 100 03/17/2019   CO2 25 03/17/2019   Lab Results  Component Value Date   CHOL 135 02/10/2018   HDL 31 (L) 02/10/2018   LDLCALC 86 02/10/2018   TRIG 88 02/10/2018   CHOLHDL 4.4 02/10/2018    Review of Systems  Constitutional: Negative for appetite change, fatigue and unexpected weight change.  HENT: Negative for hearing loss and trouble swallowing.   Eyes: Negative for visual disturbance.  Respiratory: Negative for cough, shortness of breath and wheezing.   Cardiovascular: Negative for chest pain, palpitations and leg swelling.  Gastrointestinal: Negative for abdominal pain and blood in stool.  Endocrine: Negative for polydipsia and polyuria.  Genitourinary: Negative for dysuria and hematuria.  Musculoskeletal: Positive for arthralgias (shoulders).  Skin: Negative for color change and rash.  Neurological: Negative for dizziness, tremors, weakness, numbness and headaches.  Psychiatric/Behavioral: Positive for depression and dysphoric mood. Negative for sleep disturbance and suicidal ideas. The patient is not nervous/anxious.     Patient Active Problem List   Diagnosis Date Noted  . Centrilobular emphysema ([place]) 01/26/2019  . Aortic atherosclerosis (  [place]) 01/26/2019  . Echocardiogram shows left ventricular diastolic dysfunction 62/37/6283  . Moderate episode of recurrent major depressive disorder ([place]) 02/16/2018  . Erectile dysfunction 02/16/2018  . History of CVA  (cerebrovascular accident) without residual deficits 08/13/2017  . BPH without obstruction/lower urinary tract symptoms 03/19/2017  . Chronic bilateral low back pain 11/14/2016  . 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 GERD (gastroesophageal reflux disease) 04/11/2016  . Obesity 04/11/2016  . Allergic rhinitis 08/04/2015  . Hypertension 07/04/2015  . Hyperlipidemia due to type 2 diabetes mellitus ([place]) 07/04/2015  . Type 2 diabetes mellitus with other specified complication ([place]) 15/17/6160  . Tendinitis of elbow or forearm 04/26/2015  . History of repair of rotator cuff 08/31/2014    No Known Allergies  Past Surgical History:  Procedure Laterality Date  . CIRCUMCISION N/A 07/24/2015   Procedure: CIRCUMCISION ADULT;  Surgeon: Nickie Retort, MD;  Location: [facility] ORS;  Service: Urology;  Laterality: N/A;  . EYE SURGERY Right    right eye cataract surgery   . SHOULDER ARTHROSCOPY Bilateral   . SHOULDER ARTHROSCOPY Right 12/22/2014   Procedure: RIGHT SHOULDER ARTHROSCOPY /DECOMPRESSION/ROTATOR CUFF REPAIR OF RECURRENT ROTATOR CUFF TEAR;  Surgeon: Corky Mull, MD;  Location: [facility] ORS;  Service: Orthopedics;  Laterality: Right;    Social History   Tobacco Use  . Smoking status: Former Smoker    Packs/day: 1.50    Years: 25.00    Pack years: 37.50    Types: Cigarettes    Start date: 12/19/2001    Quit date: 07/03/2002    Years since quitting: 16.7  . Smokeless tobacco: Former Systems developer  . Tobacco comment: quit smoking in 06/2001  Substance Use Topics  . Alcohol use: No    Alcohol/week: 0.0 standard drinks  . Drug use: No     Medication list has been reviewed and updated.  Current Meds  Medication Sig  . ACCU-CHEK SOFTCLIX LANCETS lancets 100 each by Other route 2 (two) times daily. for testing  . albuterol (VENTOLIN HFA) 108 (90 Base) MCG/ACT inhaler Inhale 1-2 puffs into the lungs every 6 (six) hours as needed for wheezing or shortness of breath.  Marland Kitchen  aspirin EC 81 MG tablet Take 1 tablet (81 mg total) by mouth daily.  Marland Kitchen atorvastatin (LIPITOR) 80 MG tablet Take 1 tablet (80 mg total) by mouth every morning.  . Blood Glucose Monitoring Suppl (ONE TOUCH ULTRA MINI) w/Device KIT 1 kit by Does not apply route as directed.  . fluticasone (FLONASE) 50 MCG/ACT nasal spray USE 1 SPRAY IN BOTH  NOSTRILS DAILY  . glucose blood test strip Check blood sugar twice daily. Dx.E11.9  . Insulin Pen Needle (PEN NEEDLES) 32G X 4 MM MISC 1 Device by Does not apply route every 7 (seven) days.  Marland Kitchen lisinopril (ZESTRIL) 40 MG tablet Take 1 tablet (40 mg total) by mouth daily.  . metFORMIN (GLUCOPHAGE) 1000 MG tablet Take 1 tablet (1,000 mg total) by mouth 2 (two) times daily with a meal.  . naproxen (NAPROSYN) 500 MG tablet Take 1 tablet (500 mg total) by mouth 2 (two) times daily with a meal. For 2-4 weeks then as needed  . ONETOUCH DELICA LANCETS 73X MISC 1 each by Does not apply route 2 (two) times daily. Dx: E11.9  . pantoprazole (PROTONIX) 40 MG tablet Take 1 tablet (40 mg total) by mouth daily.  . tamsulosin (FLOMAX) 0.4 MG CAPS capsule Take 1 capsule (0.4 mg total) by mouth daily. For prostate  PHQ 2/9 Scores 04/02/2019 01/26/2019 08/24/2018 08/24/2018  PHQ - 2 Score 5 3 - -  PHQ- 9 Score 10 10 - -  Exception Documentation - - Patient refusal Patient refusal    BP Readings from Last 3 Encounters:  04/02/19 134/70  03/17/19 (!) 158/76  03/16/19 (!) 163/100    Physical Exam Vitals signs and nursing note reviewed.  Constitutional:      General: He is irritable. He is not in acute distress.    Appearance: He is well-developed.  HENT:     Head: Normocephalic and atraumatic.  Cardiovascular:     Rate and Rhythm: Normal rate and regular rhythm.     Pulses: Normal pulses.     Heart sounds: No murmur.  Pulmonary:     Effort: Pulmonary effort is normal. No respiratory distress.     Breath sounds: Decreased breath sounds present. No wheezing or rhonchi.   Musculoskeletal: Normal range of motion.     Right lower leg: No edema.     Left lower leg: No edema.  Skin:    General: Skin is warm and dry.     Capillary Refill: Capillary refill takes less than 2 seconds.     Findings: No rash.  Neurological:     General: No focal deficit present.     Mental Status: He is alert and oriented to person, place, and time.     Cranial Nerves: Cranial nerves are intact.     Sensory: Sensation is intact.     Motor: Motor function is intact.     Gait: Gait is intact.     Deep Tendon Reflexes:     Reflex Scores:      Bicep reflexes are 2+ on the right side and 2+ on the left side.      Patellar reflexes are 2+ on the right side and 2+ on the left side. Psychiatric:        Attention and Perception: Attention normal.        Mood and Affect: Mood is depressed. Affect is not flat.        Speech: Speech normal.        Behavior: Behavior normal.        Thought Content: Thought content normal.        Cognition and Memory: Cognition normal.        Judgment: Judgment normal.     Wt Readings from Last 3 Encounters:  04/02/19 234 lb (106.1 kg)  03/17/19 238 lb 15.7 oz (108.4 kg)  03/16/19 238 lb 15.7 oz (108.4 kg)    BP 134/70   Pulse 87   Ht '5\' 11"'$  (1.803 m)   Wt 234 lb (106.1 kg)   SpO2 96%   BMI 32.64 kg/m   Assessment and Plan: 1. Essential hypertension Clinically stable with good control of BP on current regimen Hx of TIA so will continue ASA daily Discussed regular exercise and low sodium diet - lisinopril (ZESTRIL) 40 MG tablet; Take 1 tablet (40 mg total) by mouth daily.  Dispense: 90 tablet; Refill: 1  2. Type 2 diabetes mellitus with other specified complication, without long-term current use of insulin (HCC) Pt compliant with medication and occasional FSBS- appears to be controlled Re-enforced exercise, weight loss  Pt reminded to schedule DM eye exam - Comprehensive metabolic panel - Hemoglobin A1c  3. Moderate episode of  recurrent major depressive disorder (HCC) Pt declines medication or referral at this time He feels stable without worrisome symptoms Urged to  follow up if worsening  4. Aortic atherosclerosis (HCC) With hx of TIA Continue daily statin therapy - adjust if not at goal of < 70 - Lipid panel  5. Other headache syndrome Now resolved Await Neuro evaluation  6. Hyperlipidemia due to type 2 diabetes mellitus (HCC) - atorvastatin (LIPITOR) 80 MG tablet; Take 1 tablet (80 mg total) by mouth every morning.  Dispense: 90 tablet; Refill: 1  7. Gastroesophageal reflux disease, esophagitis presence not specified - pantoprazole (PROTONIX) 40 MG tablet; Take 1 tablet (40 mg total) by mouth daily.  Dispense: 90 tablet; Refill: 1  8. BPH without obstruction/lower urinary tract symptoms - tamsulosin (FLOMAX) 0.4 MG CAPS capsule; Take 1 capsule (0.4 mg total) by mouth daily. For prostate  Dispense: 90 capsule; Refill: 1   Partially dictated using Editor, commissioning. Any errors are unintentional.  Halina Maidens, MD [facility]  04/02/2019

## 2019-04-03 LAB — COMPREHENSIVE METABOLIC PANEL
ALT: 26 IU/L (ref 0–44)
AST: 25 IU/L (ref 0–40)
Albumin/Globulin Ratio: 1.6 (ref 1.2–2.2)
Albumin: 4.5 g/dL (ref 3.8–4.8)
Alkaline Phosphatase: 96 IU/L (ref 39–117)
BUN/Creatinine Ratio: 12 (ref 10–24)
BUN: 15 mg/dL (ref 8–27)
Bilirubin Total: 0.6 mg/dL (ref 0.0–1.2)
CO2: 26 mmol/L (ref 20–29)
Calcium: 10 mg/dL (ref 8.6–10.2)
Chloride: 102 mmol/L (ref 96–106)
Creatinine, Ser: 1.21 mg/dL (ref 0.76–1.27)
GFR calc Af Amer: 70 mL/min/{1.73_m2} (ref >59)
GFR calc non Af Amer: 61 mL/min/{1.73_m2} (ref >59)
Globulin, Total: 2.9 g/dL (ref 1.5–4.5)
Glucose: 196 mg/dL — ABNORMAL HIGH (ref 65–99)
Potassium: 5.1 mmol/L (ref 3.5–5.2)
Sodium: 142 mmol/L (ref 134–144)
Total Protein: 7.4 g/dL (ref 6.0–8.5)

## 2019-04-03 LAB — LIPID PANEL
Chol/HDL Ratio: 4.4 ratio (ref 0.0–5.0)
Cholesterol, Total: 162 mg/dL (ref 100–199)
HDL: 37 mg/dL — ABNORMAL LOW (ref >39)
LDL Calculated: 111 mg/dL — ABNORMAL HIGH (ref 0–99)
Triglycerides: 68 mg/dL (ref 0–149)
VLDL Cholesterol Cal: 14 mg/dL (ref 5–40)

## 2019-04-03 LAB — HEMOGLOBIN A1C
Est. average glucose Bld gHb Est-mCnc: 171 mg/dL
Hgb A1c MFr Bld: 7.6 % — ABNORMAL HIGH (ref 4.8–5.6)

## 2019-04-08 ENCOUNTER — Other Ambulatory Visit: Payer: Self-pay | Admitting: Internal Medicine

## 2019-04-08 ENCOUNTER — Telehealth: Payer: Self-pay

## 2019-04-08 DIAGNOSIS — E1169 Type 2 diabetes mellitus with other specified complication: Secondary | ICD-10-CM

## 2019-04-08 MED ORDER — ROSUVASTATIN CALCIUM 40 MG PO TABS: 40.0000 mg | ORAL_TABLET | Freq: Every day | ORAL | 1 refills | Status: DC

## 2019-04-08 NOTE — Telephone Encounter (Signed)
Patient called back today after leaving pt several VMs about labs- he stated the doctor can send in his medication change  Please Advise.

## 2019-04-08 NOTE — Telephone Encounter (Signed)
New Rx sent to [facility].  Take current atorvastatin until new Rx arrives.

## 2019-04-09 ENCOUNTER — Encounter: Payer: Self-pay | Admitting: Internal Medicine

## 2019-04-09 NOTE — Telephone Encounter (Signed)
Called and left detailed message informing this info.

## 2019-04-12 ENCOUNTER — Other Ambulatory Visit: Payer: Self-pay | Admitting: Internal Medicine

## 2019-04-12 ENCOUNTER — Other Ambulatory Visit: Payer: Self-pay

## 2019-04-12 DIAGNOSIS — J432 Centrilobular emphysema: Secondary | ICD-10-CM

## 2019-04-12 MED ORDER — ALBUTEROL SULFATE HFA 108 (90 BASE) MCG/ACT IN AERS: 1.0000 | INHALATION_SPRAY | Freq: Four times a day (QID) | RESPIRATORY_TRACT | 3 refills | Status: DC | PRN

## 2019-04-12 MED ORDER — ALBUTEROL SULFATE HFA 108 (90 BASE) MCG/ACT IN AERS: 2.0000 | INHALATION_SPRAY | Freq: Four times a day (QID) | RESPIRATORY_TRACT | 1 refills | Status: DC | PRN

## 2019-04-14 ENCOUNTER — Other Ambulatory Visit: Payer: Self-pay

## 2019-04-14 MED ORDER — ALBUTEROL SULFATE HFA 108 (90 BASE) MCG/ACT IN AERS: 2.0000 | INHALATION_SPRAY | Freq: Four times a day (QID) | RESPIRATORY_TRACT | 3 refills | Status: DC | PRN

## 2019-04-28 DIAGNOSIS — Z8673 Personal history of transient ischemic attack (TIA), and cerebral infarction without residual deficits: Secondary | ICD-10-CM | POA: Diagnosis not present

## 2019-04-28 DIAGNOSIS — R51 Headache: Secondary | ICD-10-CM | POA: Diagnosis not present

## 2019-04-29 DIAGNOSIS — R05 Cough: Secondary | ICD-10-CM | POA: Diagnosis not present

## 2019-05-12 ENCOUNTER — Ambulatory Visit: Payer: Medicare Other | Admitting: Internal Medicine

## 2019-06-01 ENCOUNTER — Telehealth: Payer: Self-pay

## 2019-06-01 NOTE — Telephone Encounter (Signed)
He has not had this medication for at least a year.  It was removed from his medication list at the end of 2018.  We can discuss his need for this at his next appointment.

## 2019-06-01 NOTE — Telephone Encounter (Signed)
Patient called requesting a Refill for Baclofen. Wants sent to [facility].  Please advise.

## 2019-06-02 NOTE — Telephone Encounter (Signed)
Please advise and call pt. If he needs this now, then he needs to come in for a OV and discuss getting RF.

## 2019-06-24 ENCOUNTER — Other Ambulatory Visit: Payer: Self-pay | Admitting: Internal Medicine

## 2019-06-24 DIAGNOSIS — I10 Essential (primary) hypertension: Secondary | ICD-10-CM

## 2019-07-01 ENCOUNTER — Other Ambulatory Visit: Payer: Self-pay | Admitting: Internal Medicine

## 2019-07-01 DIAGNOSIS — E1169 Type 2 diabetes mellitus with other specified complication: Secondary | ICD-10-CM

## 2019-07-28 ENCOUNTER — Ambulatory Visit (INDEPENDENT_AMBULATORY_CARE_PROVIDER_SITE_OTHER): Payer: Medicare Other | Admitting: Internal Medicine

## 2019-07-28 ENCOUNTER — Other Ambulatory Visit: Payer: Self-pay

## 2019-07-28 ENCOUNTER — Encounter: Payer: Self-pay | Admitting: Internal Medicine

## 2019-07-28 VITALS — BP 146/80 | HR 93 | Ht 71.0 in | Wt 236.0 lb

## 2019-07-28 DIAGNOSIS — G8929 Other chronic pain: Secondary | ICD-10-CM

## 2019-07-28 DIAGNOSIS — B356 Tinea cruris: Secondary | ICD-10-CM | POA: Diagnosis not present

## 2019-07-28 DIAGNOSIS — E118 Type 2 diabetes mellitus with unspecified complications: Secondary | ICD-10-CM | POA: Diagnosis not present

## 2019-07-28 DIAGNOSIS — I10 Essential (primary) hypertension: Secondary | ICD-10-CM

## 2019-07-28 DIAGNOSIS — M545 Low back pain: Secondary | ICD-10-CM | POA: Diagnosis not present

## 2019-07-28 MED ORDER — CLOTRIMAZOLE-BETAMETHASONE 1-0.05 % EX CREA: 1.0000 "application " | TOPICAL_CREAM | Freq: Two times a day (BID) | CUTANEOUS | 1 refills | Status: DC

## 2019-07-28 MED ORDER — BACLOFEN 10 MG PO TABS: 10.0000 mg | ORAL_TABLET | Freq: Three times a day (TID) | ORAL | 0 refills | Status: DC

## 2019-07-28 NOTE — Progress Notes (Signed)
Date:  07/28/2019   Name:  Bruce Richardson   DOB:  1950/09/27   MRN:  [id number]   Chief Complaint: Hypertension  Diabetes He presents for his follow-up diabetic visit. He has type 2 diabetes mellitus. His disease course has been stable. Pertinent negatives for hypoglycemia include no dizziness, headaches or nervousness/anxiousness. Pertinent negatives for diabetes include no chest pain and no fatigue. Current diabetic treatment includes oral agent (monotherapy). He is compliant with treatment all of the time. His weight is stable. He is following a generally healthy diet. He monitors blood glucose at home 3-4 x per week. His breakfast blood glucose is taken between 7-8 am. His breakfast blood glucose range is generally 130-140 mg/dl. An ACE inhibitor/angiotensin II receptor blocker is being taken.  Hypertension This is a chronic problem. The problem is controlled. Pertinent negatives include no chest pain, headaches, palpitations or shortness of breath. Past treatments include ACE inhibitors.  Back Pain This is a chronic problem. The problem occurs daily. The pain is present in the lumbar spine. The quality of the pain is described as cramping and shooting. Associated symptoms include bladder incontinence. Pertinent negatives include no abdominal pain, bowel incontinence, chest pain, dysuria, fever or headaches. He has tried muscle relaxant, heat and bed rest for the symptoms. The treatment provided moderate relief.  Rash This is a recurrent problem. The affected locations include the groin. The rash is characterized by itchiness and redness. He was exposed to nothing. Pertinent negatives include no cough, diarrhea, fatigue, fever or shortness of breath. Treatments tried: topical antifungal cream. The treatment provided significant relief.    Lab Results  Component Value Date   CREATININE 1.21 04/02/2019   BUN 15 04/02/2019   NA 142 04/02/2019   K 5.1 04/02/2019   CL 102 04/02/2019   CO2 26  04/02/2019   Lab Results  Component Value Date   CHOL 162 04/02/2019   HDL 37 (L) 04/02/2019   LDLCALC 111 (H) 04/02/2019   TRIG 68 04/02/2019   CHOLHDL 4.4 04/02/2019   No results found for: TSH Lab Results  Component Value Date   HGBA1C 7.6 (H) 04/02/2019     Review of Systems  Constitutional: Negative for chills, fatigue, fever and unexpected weight change.  HENT: Negative for trouble swallowing.   Respiratory: Negative for cough, chest tightness and shortness of breath.   Cardiovascular: Negative for chest pain, palpitations and leg swelling.  Gastrointestinal: Negative for abdominal pain, bowel incontinence, constipation and diarrhea.  Genitourinary: Positive for bladder incontinence. Negative for dysuria and hematuria.  Musculoskeletal: Positive for back pain. Negative for gait problem and myalgias.  Skin: Positive for rash. Negative for color change.  Neurological: Negative for dizziness and headaches.  Psychiatric/Behavioral: Positive for dysphoric mood. Negative for sleep disturbance. The patient is not nervous/anxious.     Patient Active Problem List   Diagnosis Date Noted  . Centrilobular emphysema ([place]) 01/26/2019  . Aortic atherosclerosis ([place]) 01/26/2019  . Echocardiogram shows left ventricular diastolic dysfunction 49/75/3005  . Moderate episode of recurrent major depressive disorder ([place]) 02/16/2018  . Erectile dysfunction 02/16/2018  . History of CVA (cerebrovascular accident) without residual deficits 08/13/2017  . BPH without obstruction/lower urinary tract symptoms 03/19/2017  . Chronic bilateral low back pain 11/14/2016  . 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 GERD (gastroesophageal reflux disease) 04/11/2016  . Obesity 04/11/2016  . Allergic rhinitis 08/04/2015  . Hypertension 07/04/2015  . Hyperlipidemia due to type 2 diabetes mellitus ([place])  07/04/2015  . Type 2 diabetes mellitus with other specified complication ([place])  91/69/4503  . Tendinitis of elbow or forearm 04/26/2015  . History of repair of rotator cuff 08/31/2014    No Known Allergies  Past Surgical History:  Procedure Laterality Date  . CIRCUMCISION N/A 07/24/2015   Procedure: CIRCUMCISION ADULT;  Surgeon: Nickie Retort, MD;  Location: [facility] ORS;  Service: Urology;  Laterality: N/A;  . EYE SURGERY Right    right eye cataract surgery   . SHOULDER ARTHROSCOPY Bilateral   . SHOULDER ARTHROSCOPY Right 12/22/2014   Procedure: RIGHT SHOULDER ARTHROSCOPY /DECOMPRESSION/ROTATOR CUFF REPAIR OF RECURRENT ROTATOR CUFF TEAR;  Surgeon: Corky Mull, MD;  Location: [facility] ORS;  Service: Orthopedics;  Laterality: Right;    Social History   Tobacco Use  . Smoking status: Former Smoker    Packs/day: 1.50    Years: 25.00    Pack years: 37.50    Types: Cigarettes    Start date: 12/19/2001    Quit date: 07/03/2002    Years since quitting: 17.0  . Smokeless tobacco: Former Systems developer  . Tobacco comment: quit smoking in 06/2001  Substance Use Topics  . Alcohol use: No    Alcohol/week: 0.0 standard drinks  . Drug use: No     Medication list has been reviewed and updated.  Current Meds  Medication Sig  . ACCU-CHEK SOFTCLIX LANCETS lancets 100 each by Other route 2 (two) times daily. for testing  . albuterol (VENTOLIN HFA) 108 (90 Base) MCG/ACT inhaler Inhale 2 puffs into the lungs every 6 (six) hours as needed for wheezing or shortness of breath.  Marland Kitchen aspirin EC 81 MG tablet Take 1 tablet (81 mg total) by mouth daily.  . Blood Glucose Monitoring Suppl (ONE TOUCH ULTRA MINI) w/Device KIT 1 kit by Does not apply route as directed.  . fluticasone (FLONASE) 50 MCG/ACT nasal spray USE 1 SPRAY IN BOTH  NOSTRILS DAILY  . glucose blood test strip Check blood sugar twice daily. Dx.E11.9  . lisinopril (ZESTRIL) 40 MG tablet TAKE 1 TABLET BY MOUTH  DAILY  . metFORMIN (GLUCOPHAGE) 1000 MG tablet Take 1 tablet (1,000 mg total) by mouth 2 (two) times daily with a meal.   . naproxen (NAPROSYN) 500 MG tablet Take 1 tablet (500 mg total) by mouth 2 (two) times daily with a meal. For 2-4 weeks then as needed  . ONETOUCH DELICA LANCETS 88E MISC 1 each by Does not apply route 2 (two) times daily. Dx: E11.9  . pantoprazole (PROTONIX) 40 MG tablet Take 1 tablet (40 mg total) by mouth daily.  . rosuvastatin (CRESTOR) 40 MG tablet TAKE 1 TABLET BY MOUTH  DAILY  . tamsulosin (FLOMAX) 0.4 MG CAPS capsule Take 1 capsule (0.4 mg total) by mouth daily. For prostate    PHQ 2/9 Scores 07/28/2019 04/02/2019 01/26/2019 08/24/2018  PHQ - 2 Score '6 5 3 '$ -  PHQ- 9 Score '10 10 10 '$ -  Exception Documentation - - - Patient refusal    BP Readings from Last 3 Encounters:  07/28/19 (!) 146/80  04/02/19 134/70  03/17/19 (!) 158/76    Physical Exam Vitals signs and nursing note reviewed.  Constitutional:      General: He is not in acute distress.    Appearance: He is well-developed. He is obese.  HENT:     Head: Normocephalic and atraumatic.  Neck:     Musculoskeletal: Normal range of motion.  Cardiovascular:     Rate and Rhythm: Normal rate and  regular rhythm.     Pulses: Normal pulses.     Heart sounds: No murmur.  Pulmonary:     Effort: Pulmonary effort is normal. No respiratory distress.     Breath sounds: No wheezing or rhonchi.  Genitourinary:    Comments: GU exam deferred Musculoskeletal:     Right lower leg: No edema.     Left lower leg: No edema.  Lymphadenopathy:     Cervical: No cervical adenopathy.  Skin:    General: Skin is warm and dry.     Capillary Refill: Capillary refill takes less than 2 seconds.     Findings: No rash.  Neurological:     General: No focal deficit present.     Mental Status: He is alert and oriented to person, place, and time.     Gait: Gait normal.     Deep Tendon Reflexes: Reflexes normal.  Psychiatric:        Behavior: Behavior normal.        Thought Content: Thought content normal.     Wt Readings from Last 3 Encounters:   07/28/19 236 lb (107 kg)  04/02/19 234 lb (106.1 kg)  03/17/19 238 lb 15.7 oz (108.4 kg)    BP (!) 146/80   Pulse 93   Ht '5\' 11"'$  (1.803 m)   Wt 236 lb (107 kg)   SpO2 96%   BMI 32.92 kg/m   Assessment and Plan: 1. Essential hypertension Clinically stable exam with well controlled BP.   Tolerating medications, lisinopril 40 mg, without side effects at this time. Pt to continue current regimen and low sodium diet; benefits of regular exercise as able discussed.  2. Type II diabetes mellitus with complication ([place]) Clinically stable by exam and report without s/s of hypoglycemia. DM complicated by htn,lipids. Tolerating medications metformin 1000 mg bid, well without side effects or other concerns. - Hemoglobin A5B - Basic metabolic panel  3. Chronic bilateral low back pain without sciatica Long standing lumbar DDD with intermittent flares of spasm and pain Continue ice, tylenol and baclofen (refilled today) Pt does not desire to pursue imaging or surgery at this time - baclofen (LIORESAL) 10 MG tablet; Take 1 tablet (10 mg total) by mouth 3 (three) times daily.  Dispense: 180 each; Refill: 0  4. Tinea cruris Recurrent rash responds well to lotrisone - clotrimazole-betamethasone (LOTRISONE) cream; Apply 1 application topically 2 (two) times daily. Apply twice a day to groin rash  Dispense: 45 g; Refill: 1   Partially dictated using Editor, commissioning. Any errors are unintentional.  Halina Maidens, MD [facility]  07/28/2019

## 2019-07-29 ENCOUNTER — Other Ambulatory Visit: Payer: Self-pay | Admitting: Internal Medicine

## 2019-07-29 DIAGNOSIS — E118 Type 2 diabetes mellitus with unspecified complications: Secondary | ICD-10-CM

## 2019-07-29 LAB — BASIC METABOLIC PANEL
BUN/Creatinine Ratio: 12 (ref 10–24)
BUN: 13 mg/dL (ref 8–27)
CO2: 25 mmol/L (ref 20–29)
Calcium: 10 mg/dL (ref 8.6–10.2)
Chloride: 100 mmol/L (ref 96–106)
Creatinine, Ser: 1.08 mg/dL (ref 0.76–1.27)
GFR calc Af Amer: 81 mL/min/{1.73_m2} (ref >59)
GFR calc non Af Amer: 70 mL/min/{1.73_m2} (ref >59)
Glucose: 216 mg/dL — ABNORMAL HIGH (ref 65–99)
Potassium: 5 mmol/L (ref 3.5–5.2)
Sodium: 140 mmol/L (ref 134–144)

## 2019-07-29 LAB — HEMOGLOBIN A1C
Est. average glucose Bld gHb Est-mCnc: 203 mg/dL
Hgb A1c MFr Bld: 8.7 % — ABNORMAL HIGH (ref 4.8–5.6)

## 2019-07-29 MED ORDER — SITAGLIPTIN PHOSPHATE 100 MG PO TABS: 100.0000 mg | ORAL_TABLET | Freq: Every day | ORAL | 1 refills | Status: DC

## 2019-07-30 NOTE — Progress Notes (Signed)
Patient informed of labs. Said he tried pick up the Rx from [place] and its too expensive for him. $149 dollars for what we called in. He also said he does not use [facility] unless its a temporary Rx. Wants new medicine sent to [facility].  Please advise. Told him I will call him Monday with more instructions.

## 2019-08-02 ENCOUNTER — Other Ambulatory Visit: Payer: Self-pay | Admitting: Internal Medicine

## 2019-08-02 DIAGNOSIS — E118 Type 2 diabetes mellitus with unspecified complications: Secondary | ICD-10-CM

## 2019-08-02 MED ORDER — SITAGLIPTIN PHOSPHATE 100 MG PO TABS: 100.0000 mg | ORAL_TABLET | Freq: Every day | ORAL | 1 refills | Status: DC

## 2019-08-03 ENCOUNTER — Telehealth: Payer: Self-pay

## 2019-08-03 ENCOUNTER — Other Ambulatory Visit: Payer: Self-pay | Admitting: Internal Medicine

## 2019-08-03 NOTE — Telephone Encounter (Signed)
Called and spoke with pt about recent labs. He said that Januvia is too expensive for him. Also, he used to take Victoza but that is too expensive for him as well. He does not want [place] to send any medications locally for now on. He only wants medications sent to [facility]- even for a temporary problem and he said he will "wait on them to come."  He said he does not want to start on any new medications at this time. And that we don't need to send in any of his refills until the are needed.  Patient also spoke with my office manager as I began to feel uncomfortable since pt started to raise his voice saying I was supposed to call and cancel his prescriptions through his pharmacy and since I didn't they took $131 out of his check.   Glass blower/designer explained its his job to contact his pharmacy on what he needs done. And that we will document that he does not want to use any pharmacy ever but [facility] and that he does not wish to start on any new medications at this time for DM.

## 2019-08-12 ENCOUNTER — Other Ambulatory Visit: Payer: Self-pay | Admitting: Internal Medicine

## 2019-08-12 DIAGNOSIS — N400 Enlarged prostate without lower urinary tract symptoms: Secondary | ICD-10-CM

## 2019-08-12 DIAGNOSIS — K219 Gastro-esophageal reflux disease without esophagitis: Secondary | ICD-10-CM

## 2019-08-12 DIAGNOSIS — E1169 Type 2 diabetes mellitus with other specified complication: Secondary | ICD-10-CM

## 2019-09-18 ENCOUNTER — Other Ambulatory Visit: Payer: Self-pay | Admitting: Internal Medicine

## 2019-09-18 DIAGNOSIS — M545 Low back pain, unspecified: Secondary | ICD-10-CM

## 2019-09-18 DIAGNOSIS — G8929 Other chronic pain: Secondary | ICD-10-CM

## 2019-10-13 ENCOUNTER — Ambulatory Visit (INDEPENDENT_AMBULATORY_CARE_PROVIDER_SITE_OTHER): Payer: Medicare Other

## 2019-10-13 VITALS — Ht 71.0 in | Wt 225.0 lb

## 2019-10-13 DIAGNOSIS — Z599 Problem related to housing and economic circumstances, unspecified: Secondary | ICD-10-CM

## 2019-10-13 DIAGNOSIS — Z598 Other problems related to housing and economic circumstances: Secondary | ICD-10-CM

## 2019-10-13 DIAGNOSIS — Z0000 Encounter for general adult medical examination without abnormal findings: Secondary | ICD-10-CM

## 2019-10-13 NOTE — Progress Notes (Signed)
Subjective:   Bruce Richardson is a 69 y.o. male who presents for Medicare Annual/Subsequent preventive examination.  Virtual Visit via Telephone Note  I connected with [name] on 10/13/19 at 10:00 AM EST by telephone and verified that I am speaking with the correct person using two identifiers.  Medicare Annual Wellness visit completed telephonically due to Covid-19 pandemic.   Location: Patient: home Provider: office   I discussed the limitations, risks, security and privacy concerns of performing an evaluation and management service by telephone and the availability of in person appointments. The patient expressed understanding and agreed to proceed.  Some vital signs may be absent or patient reported.   Clemetine Marker, LPN    Review of Systems:   Cardiac Risk Factors include: advanced age (>4mn, >>36women);hypertension;diabetes mellitus;dyslipidemia;obesity (BMI >30kg/m2)     Objective:    Vitals: Ht '5\' 11"'$  (1.803 m)   Wt 225 lb (102.1 kg)   BMI 31.38 kg/m   Body mass index is 31.38 kg/m.  Advanced Directives 10/13/2019 03/17/2019 03/16/2019 09/02/2018 02/10/2018 08/13/2017 08/13/2017  Does Patient Have a Medical Advance Directive? No No No No Yes Yes No  Type of Advance Directive - - - - Living will;Healthcare Power of Attorney Living will -  Does patient want to make changes to medical advance directive? - - - - - No - Patient declined -  Copy of [place] Chart? - - - - No - copy requested - -  Would patient like information on creating a medical advance directive? Yes (MAU/Ambulatory/Procedural Areas - Information given) No - Patient declined No - Patient declined No - Patient declined - - -  Some encounter information is confidential and restricted. Go to Review Flowsheets activity to see all data.    Tobacco Social History   Tobacco Use  Smoking Status Former Smoker  . Packs/day: 1.50  . Years: 25.00  . Pack years: 37.50  . Types:  Cigarettes  . Start date: 12/19/2001  . Quit date: 07/03/2002  . Years since quitting: 17.2  Smokeless Tobacco Former [place]  quit smoking in 06/2001     Counseling given: Not Answered Comment: quit smoking in 06/2001   Clinical Intake:  Pre-visit preparation completed: Yes  Pain : No/denies pain     BMI - recorded: 31.38 Nutritional Status: BMI > 30  Obese Nutritional Risks: None Diabetes: No   Nutrition Risk Assessment:  Has the patient had any N/V/D within the last 2 months?  No  Does the patient have any non-healing wounds?  No  Has the patient had any unintentional weight loss or weight gain?  No   Diabetes:  Is the patient diabetic?  Yes  If diabetic, was a CBG obtained today?  No  Did the patient bring in their glucometer from home?  No  How often do you monitor your CBG's? daily.   Financial Strains and Diabetes Management:  Are you having any financial strains with the device, your supplies or your medication? No .  Does the patient want to be seen by Chronic Care Management for management of their diabetes?  No  Would the patient like to be referred to a Nutritionist or for Diabetic Management?  No   Diabetic Exams:  Diabetic Eye Exam: Completed 11/04/18 negative retinopathy.  Diabetic Foot Exam: Completed 04/02/19.   How often do you need to have someone help you when you read instructions, pamphlets, or other written materials from  your doctor or pharmacy?: 1 - Never  Interpreter Needed?: No  Information entered by :: Clemetine Marker LPN  Past Medical History:  Diagnosis Date  . Arthritis   . Bulging lumbar disc    lower back  . Complete rotator cuff rupture of left shoulder 09/23/2014  . Complication of anesthesia    kept moving while under anesthesia during colonoscopy, hard to put under.  . Diabetes mellitus without complication ([place])   . GERD (gastroesophageal reflux disease)   . Hypertension   . Injury of tendon of upper  extremity 12/23/2014  . Non-retracting foreskin 07/04/2015  . Stroke [facility])    Past Surgical History:  Procedure Laterality Date  . CIRCUMCISION N/A 07/24/2015   Procedure: CIRCUMCISION ADULT;  Surgeon: Nickie Retort, MD;  Location: [facility] ORS;  Service: Urology;  Laterality: N/A;  . EYE SURGERY Right    right eye cataract surgery   . SHOULDER ARTHROSCOPY Bilateral   . SHOULDER ARTHROSCOPY Right 12/22/2014   Procedure: RIGHT SHOULDER ARTHROSCOPY /DECOMPRESSION/ROTATOR CUFF REPAIR OF RECURRENT ROTATOR CUFF TEAR;  Surgeon: Corky Mull, MD;  Location: [facility] ORS;  Service: Orthopedics;  Laterality: Right;   Family History  Problem Relation Age of Onset  . Alzheimer's disease Mother   . Diabetes Mother   . Hypertension Mother   . Cancer Father   . Heart failure Brother   . Diabetes Brother   . Hypertension Brother   . Heart disease Brother   . Prostate cancer Other        unknown  . Bladder Cancer Other        unknown  . Heart disease Son   . Diabetes Sister    Social History   Socioeconomic History  . Marital status: Married    Spouse name: Not on file  . Number of children: Not on file  . Years of education: Not on file  . Highest education level: Not on file  Occupational History  . Occupation: retired  Tobacco Use  . Smoking status: Former Smoker    Packs/day: 1.50    Years: 25.00    Pack years: 37.50    Types: Cigarettes    Start date: 12/19/2001    Quit date: 07/03/2002    Years since quitting: 17.2  . Smokeless tobacco: Former Systems developer  . Tobacco comment: quit smoking in 06/2001  Substance and Sexual Activity  . Alcohol use: No    Alcohol/week: 0.0 standard drinks  . Drug use: No  . Sexual activity: Yes    Partners: Female  Other Topics Concern  . Not on file  Social History Narrative  . Not on file   Social Determinants of Health   Financial Resource Strain: Low Risk   . Difficulty of Paying Living Expenses: Not very hard  Food Insecurity: Food Insecurity  Present  . Worried About Charity fundraiser in the Last Year: Often true  . Ran Out of Food in the Last Year: Often true  Transportation Needs: No Transportation Needs  . Lack of Transportation (Medical): No  . Lack of Transportation (Non-Medical): No  Physical Activity: Inactive  . Days of Exercise per Week: 0 days  . Minutes of Exercise per Session: 0 min  Stress: No Stress Concern Present  . Feeling of Stress : Only a little  Social Connections: Somewhat Isolated  . Frequency of Communication with Friends and Family: More than three times a week  . Frequency of Social Gatherings with Friends and Family: More than three  times a week  . Attends Religious Services: Never  . Active Member of Clubs or Organizations: No  . Attends Archivist Meetings: Never  . Marital Status: Married    Outpatient Encounter Medications as of 10/13/2019  Medication Sig  . albuterol (VENTOLIN HFA) 108 (90 Base) MCG/ACT inhaler Inhale 2 puffs into the lungs every 6 (six) hours as needed for wheezing or shortness of breath.  . Ascorbic Acid (VITAMIN C) 1000 MG tablet Take 1,000 mg by mouth daily.  Marland Kitchen aspirin EC 81 MG tablet Take 1 tablet (81 mg total) by mouth daily.  . baclofen (LIORESAL) 10 MG tablet TAKE 1 TABLET BY MOUTH 3  TIMES DAILY  . Blood Glucose Monitoring Suppl (ONE TOUCH ULTRA MINI) w/Device KIT 1 kit by Does not apply route as directed.  . clotrimazole-betamethasone (LOTRISONE) cream Apply 1 application topically 2 (two) times daily. Apply twice a day to groin rash  . fluticasone (FLONASE) 50 MCG/ACT nasal spray USE 1 SPRAY IN BOTH  NOSTRILS DAILY  . glucose blood test strip Check blood sugar twice daily. Dx.E11.9  . lisinopril (ZESTRIL) 40 MG tablet TAKE 1 TABLET BY MOUTH  DAILY  . metFORMIN (GLUCOPHAGE) 1000 MG tablet TAKE 1 TABLET BY MOUTH  TWICE DAILY WITH A MEAL  . naproxen (NAPROSYN) 500 MG tablet Take 1 tablet (500 mg total) by mouth 2 (two) times daily with a meal. For 2-4  weeks then as needed  . Omega 3-6-9 Fatty Acids (OMEGA-3-6-9 PO) Take by mouth. 1600 mg  . ONETOUCH DELICA LANCETS 78M MISC 1 each by Does not apply route 2 (two) times daily. Dx: E11.9  . pantoprazole (PROTONIX) 40 MG tablet TAKE 1 TABLET BY MOUTH  DAILY  . rosuvastatin (CRESTOR) 40 MG tablet TAKE 1 TABLET BY MOUTH  DAILY  . tamsulosin (FLOMAX) 0.4 MG CAPS capsule TAKE 1 CAPSULE BY MOUTH  DAILY FOR PROSTATE  . vitamin B-12 (CYANOCOBALAMIN) 1000 MCG tablet Take 1,000 mcg by mouth daily.  . [DISCONTINUED] ACCU-CHEK SOFTCLIX LANCETS lancets 100 each by Other route 2 (two) times daily. for testing   No facility-administered encounter medications on file as of 10/13/2019.    Activities of Daily Living In your present state of health, do you have any difficulty performing the following activities: 10/13/2019 04/02/2019  Hearing? N N  Comment declines hearing aids -  Vision? N N  Difficulty concentrating or making decisions? N N  Walking or climbing stairs? N N  Dressing or bathing? N N  Doing errands, shopping? N N  Preparing Food and eating ? N -  Using the Toilet? N -  In the past six months, have you accidently leaked urine? N -  Do you have problems with loss of bowel control? N -  Managing your Medications? N -  Managing your Finances? N -  Housekeeping or managing your Housekeeping? N -  Some recent data might be hidden    Patient Care Team: Glean Hess, MD as PCP - General (Internal Medicine) Anell Barr, OD (Optometry)   Assessment:   This is a routine wellness examination for [place].  Exercise Activities and Dietary recommendations Current Exercise Habits: The patient does not participate in regular exercise at present, Exercise limited by: None identified  Goals    . DIET - INCREASE WATER INTAKE     Recommend continue drinking at least 6-8 glasses of water a day     . Exercise     Continue walking.  Fall Risk Fall Risk  10/13/2019 01/26/2019 08/24/2018  02/16/2018 02/10/2018  Falls in the past year? 0 0 0 No No  Number falls in past yr: 0 0 - - -  Injury with Fall? 0 0 - - -  Risk for fall due to : No Fall Risks - - - -  Follow up Falls prevention discussed Falls evaluation completed Falls evaluation completed - -   FALL RISK PREVENTION PERTAINING TO THE HOME:  Any stairs in or around the home? Yes  If so, do they handrails? Yes   Home free of loose throw rugs in walkways, pet beds, electrical cords, etc? Yes  Adequate lighting in your home to reduce risk of falls? Yes   ASSISTIVE DEVICES UTILIZED TO PREVENT FALLS:  Life alert? No  Use of a cane, walker or w/c? No  Grab bars in the bathroom? Yes  Shower chair or bench in shower? No  Elevated toilet seat or a handicapped toilet? Yes   DME ORDERS:  DME order needed?  No   TIMED UP AND GO:  Was the test performed? No . Telephonic visit.   Education: Fall risk prevention has been discussed.  Intervention(s) required? No   Depression Screen PHQ 2/9 Scores 10/13/2019 07/28/2019 04/02/2019 01/26/2019  PHQ - 2 Score '6 6 5 3  '$ PHQ- 9 Score '12 10 10 10  '$ Exception Documentation - - - -    Cognitive Function MMSE - Mini Mental State Exam 04/12/2015  Orientation to time 5  Orientation to Place 5  Registration 3  Attention/ Calculation 5  Recall 3  Language- name 2 objects 2  Language- repeat 1  Language- follow 3 step command 3  Language- read & follow direction 0  Write a sentence 0  Copy design 0  Total score 27     6CIT Screen 10/13/2019 02/10/2018 02/04/2017  What Year? 0 points 0 points 0 points  What month? 0 points 0 points 0 points  What time? 0 points 0 points 0 points  Count back from 20 0 points 0 points 0 points  Months in reverse 0 points 0 points 0 points  Repeat phrase 0 points 0 points 0 points  Total Score 0 0 0     There is no immunization history on file for this patient.  Qualifies for Shingles Vaccine? Yes . Due for Shingrix. Education has been  provided regarding the importance of this vaccine. Pt has been advised to call insurance company to determine out of pocket expense. Advised may also receive vaccine at local pharmacy or Health Dept. Verbalized acceptance and understanding.  Tdap: Although this vaccine is not a covered service during a Wellness Exam, does the patient still wish to receive this vaccine today?  No .  Education has been provided regarding the importance of this vaccine. Advised may receive this vaccine at local pharmacy or Health Dept. Aware to provide a copy of the vaccination record if obtained from local pharmacy or Health Dept. Verbalized acceptance and understanding.  Flu Vaccine: Due for Flu vaccine. Does the patient want to receive this vaccine today?  No . Education has been provided regarding the importance of this vaccine but still declined. Advised may receive this vaccine at local pharmacy or Health Dept. Aware to provide a copy of the vaccination record if obtained from local pharmacy or Health Dept. Verbalized acceptance and understanding.  Pneumococcal Vaccine: Due for Pneumococcal vaccine. Does the patient want to receive this vaccine today?  No . Education  has been provided regarding the importance of this vaccine but still declined. Advised may receive this vaccine at local pharmacy or Health Dept. Aware to provide a copy of the vaccination record if obtained from local pharmacy or Health Dept. Verbalized acceptance and understanding.   Screening Tests Health Maintenance  Topic Date Due  . INFLUENZA VACCINE  11/17/2019 (Originally 03/20/2019)  . PNA vac Low Risk Adult (1 of 2 - PCV13) 01/26/2020 (Originally 02/16/2015)  . TETANUS/TDAP  07/27/2020 (Originally 02/15/1970)  . OPHTHALMOLOGY EXAM  11/04/2019  . HEMOGLOBIN A1C  01/26/2020  . Fecal DNA (Cologuard)  03/04/2020  . FOOT EXAM  04/01/2020  . Hepatitis C Screening  Completed   Cancer Screenings:  Colorectal Screening: Cologuard Completed  03/04/17. Repeat every 3 years;  Lung Cancer Screening: (Low Dose CT Chest recommended if Age 68-80 years, 30 pack-year currently smoking OR have quit w/in 15years.) does not qualify.    Additional Screening:  Hepatitis C Screening: does qualify; Completed 02/25/17  Vision Screening: Recommended annual ophthalmology exams for early detection of glaucoma and other disorders of the eye. Is the patient up to date with their annual eye exam?  Yes  Who is the provider or what is the name of the office in which the pt attends annual eye exams? Dr. Ellin Mayhew  Dental Screening: Recommended annual dental exams for proper oral hygiene  Community Resource Referral:  CRR required this visit?  Yes - food resources      Plan:    I have personally reviewed and addressed the Medicare Annual Wellness questionnaire and have noted the following in the patient's chart:  A. Medical and social history B. Use of alcohol, tobacco or illicit drugs  C. Current medications and supplements D. Functional ability and status E.  Nutritional status F.  Physical activity G. Advance directives H. List of other physicians I.  Hospitalizations, surgeries, and ER visits in previous 12 months J.  [place] such as hearing and vision if needed, cognitive and depression L. Referrals and appointments   In addition, I have reviewed and discussed with patient certain preventive protocols, quality metrics, and best practice recommendations. A written personalized care plan for preventive services as well as general preventive health recommendations were provided to patient.   Signed,  Clemetine Marker, LPN Nurse Health Advisor   Nurse Notes: none

## 2019-10-13 NOTE — Patient Instructions (Signed)
Bruce Richardson , Thank you for taking time to come for your Medicare Wellness Visit. I appreciate your ongoing commitment to your health goals. Please review the following plan we discussed and let me know if I can assist you in the future.   Screening recommendations/referrals: Colonoscopy: Cologuard completed 02/2017. Repeat this year.  Recommended yearly ophthalmology/optometry visit for glaucoma screening and checkup Recommended yearly dental visit for hygiene and checkup  Vaccinations: Influenza vaccine: postponed Pneumococcal vaccine: postponed Tdap vaccine: postponed Shingles vaccine: Shingrix discussed. Please contact your pharmacy for coverage information.   Advanced directives: Advance directive discussed with you today. I have provided a copy for you to complete at home and have notarized. Once this is complete please bring a copy in to our office so we can scan it into your chart.  Conditions/risks identified: Recommend increasing physical activity   Next appointment: Please follow up in one year for your Medicare Annual Wellness visit.    Preventive Care 70 Years and Older, Male Preventive care refers to lifestyle choices and visits with your health care provider that can promote health and wellness. What does preventive care include?  A yearly physical exam. This is also called an annual well check.  Dental exams once or twice a year.  Routine eye exams. Ask your health care provider how often you should have your eyes checked.  Personal lifestyle choices, including:  Daily care of your teeth and gums.  Regular physical activity.  Eating a healthy diet.  Avoiding tobacco and drug use.  Limiting alcohol use.  Practicing safe sex.  Taking low doses of aspirin every day.  Taking vitamin and mineral supplements as recommended by your health care provider. What happens during an annual well check? The services and screenings done by your health care provider during  your annual well check will depend on your age, overall health, lifestyle risk factors, and family history of disease. Counseling  Your health care provider may ask you questions about your:  Alcohol use.  Tobacco use.  Drug use.  Emotional well-being.  Home and relationship well-being.  Sexual activity.  Eating habits.  History of falls.  Memory and ability to understand (cognition).  Work and work Astronomer. Screening  You may have the following tests or measurements:  Height, weight, and BMI.  Blood pressure.  Lipid and cholesterol levels. These may be checked every 5 years, or more frequently if you are over 51 years old.  Skin check.  Lung cancer screening. You may have this screening every year starting at age 40 if you have a 30-pack-year history of smoking and currently smoke or have quit within the past 15 years.  Fecal occult blood test (FOBT) of the stool. You may have this test every year starting at age 70.  Flexible sigmoidoscopy or colonoscopy. You may have a sigmoidoscopy every 5 years or a colonoscopy every 10 years starting at age 70.  Prostate cancer screening. Recommendations will vary depending on your family history and other risks.  Hepatitis C blood test.  Hepatitis B blood test.  Sexually transmitted disease (STD) testing.  Diabetes screening. This is done by checking your blood sugar (glucose) after you have not eaten for a while (fasting). You may have this done every 1-3 years.  Abdominal aortic aneurysm (AAA) screening. You may need this if you are a current or former smoker.  Osteoporosis. You may be screened starting at age 70 if you are at high risk. Talk with your health care provider about  your test results, treatment options, and if necessary, the need for more tests. Vaccines  Your health care provider may recommend certain vaccines, such as:  Influenza vaccine. This is recommended every year.  Tetanus, diphtheria, and  acellular pertussis (Tdap, Td) vaccine. You may need a Td booster every 10 years.  Zoster vaccine. You may need this after age 40.  Pneumococcal 13-valent conjugate (PCV13) vaccine. One dose is recommended after age 27.  Pneumococcal polysaccharide (PPSV23) vaccine. One dose is recommended after age 70. Talk to your health care provider about which screenings and vaccines you need and how often you need them. This information is not intended to replace advice given to you by your health care provider. Make sure you discuss any questions you have with your health care provider. Document Released: 09/01/2015 Document Revised: 04/24/2016 Document Reviewed: 06/06/2015 Elsevier Interactive Patient Education  2017 [place] Prevention in the Home Falls can cause injuries. They can happen to people of all ages. There are many things you can do to make your home safe and to help prevent falls. What can I do on the outside of my home?  Regularly fix the edges of walkways and driveways and fix any cracks.  Remove anything that might make you trip as you walk through a door, such as a raised step or threshold.  Trim any bushes or trees on the path to your home.  Use bright outdoor lighting.  Clear any walking paths of anything that might make someone trip, such as rocks or tools.  Regularly check to see if handrails are loose or broken. Make sure that both sides of any steps have handrails.  Any raised decks and porches should have guardrails on the edges.  Have any leaves, snow, or ice cleared regularly.  Use sand or salt on walking paths during winter.  Clean up any spills in your garage right away. This includes oil or grease spills. What can I do in the bathroom?  Use night lights.  Install grab bars by the toilet and in the tub and shower. Do not use towel bars as grab bars.  Use non-skid mats or decals in the tub or shower.  If you need to sit down in the shower, use  a plastic, non-slip stool.  Keep the floor dry. Clean up any water that spills on the floor as soon as it happens.  Remove soap buildup in the tub or shower regularly.  Attach bath mats securely with double-sided non-slip rug tape.  Do not have throw rugs and other things on the floor that can make you trip. What can I do in the bedroom?  Use night lights.  Make sure that you have a light by your bed that is easy to reach.  Do not use any sheets or blankets that are too big for your bed. They should not hang down onto the floor.  Have a firm chair that has side arms. You can use this for support while you get dressed.  Do not have throw rugs and other things on the floor that can make you trip. What can I do in the kitchen?  Clean up any spills right away.  Avoid walking on wet floors.  Keep items that you use a lot in easy-to-reach places.  If you need to reach something above you, use a strong step stool that has a grab bar.  Keep electrical cords out of the way.  Do not use floor polish or wax  that makes floors slippery. If you must use wax, use non-skid floor wax.  Do not have throw rugs and other things on the floor that can make you trip. What can I do with my stairs?  Do not leave any items on the stairs.  Make sure that there are handrails on both sides of the stairs and use them. Fix handrails that are broken or loose. Make sure that handrails are as long as the stairways.  Check any carpeting to make sure that it is firmly attached to the stairs. Fix any carpet that is loose or worn.  Avoid having throw rugs at the top or bottom of the stairs. If you do have throw rugs, attach them to the floor with carpet tape.  Make sure that you have a light switch at the top of the stairs and the bottom of the stairs. If you do not have them, ask someone to add them for you. What else can I do to help prevent falls?  Wear shoes that:  Do not have high heels.  Have  rubber bottoms.  Are comfortable and fit you well.  Are closed at the toe. Do not wear sandals.  If you use a stepladder:  Make sure that it is fully opened. Do not climb a closed stepladder.  Make sure that both sides of the stepladder are locked into place.  Ask someone to hold it for you, if possible.  Clearly mark and make sure that you can see:  Any grab bars or handrails.  First and last steps.  Where the edge of each step is.  Use tools that help you move around (mobility aids) if they are needed. These include:  Canes.  Walkers.  Scooters.  Crutches.  Turn on the lights when you go into a dark area. Replace any light bulbs as soon as they burn out.  Set up your furniture so you have a clear path. Avoid moving your furniture around.  If any of your floors are uneven, fix them.  If there are any pets around you, be aware of where they are.  Review your medicines with your doctor. Some medicines can make you feel dizzy. This can increase your chance of falling. Ask your doctor what other things that you can do to help prevent falls. This information is not intended to replace advice given to you by your health care provider. Make sure you discuss any questions you have with your health care provider. Document Released: 06/01/2009 Document Revised: 01/11/2016 Document Reviewed: 09/09/2014 Elsevier Interactive Patient Education  2017 [employer].

## 2019-10-14 ENCOUNTER — Telehealth: Payer: Self-pay

## 2019-10-14 NOTE — Telephone Encounter (Signed)
10/14/2019 Spoke with patient about food resources. Patient requestes that resources be mailed to his home.  Emailed letter to [employer] to mail to patient. Will call patient in a few days to make sure he has received mailed resources. Olean Ree [phone]

## 2019-11-04 ENCOUNTER — Telehealth: Payer: Self-pay

## 2019-11-04 DIAGNOSIS — H1045 Other chronic allergic conjunctivitis: Secondary | ICD-10-CM | POA: Diagnosis not present

## 2019-11-04 DIAGNOSIS — H2512 Age-related nuclear cataract, left eye: Secondary | ICD-10-CM | POA: Diagnosis not present

## 2019-11-04 DIAGNOSIS — E119 Type 2 diabetes mellitus without complications: Secondary | ICD-10-CM | POA: Diagnosis not present

## 2019-11-04 NOTE — Telephone Encounter (Signed)
11/04/2019 Spoke with patient, he received his resources for food pantries and has transportation to get to them.  Patient has no other needs right now. Closing referral. Olean Ree [phone]

## 2019-11-29 ENCOUNTER — Encounter: Payer: Self-pay | Admitting: Internal Medicine

## 2019-11-29 ENCOUNTER — Other Ambulatory Visit: Payer: Self-pay

## 2019-11-29 ENCOUNTER — Ambulatory Visit (INDEPENDENT_AMBULATORY_CARE_PROVIDER_SITE_OTHER): Payer: Medicare Other | Admitting: Internal Medicine

## 2019-11-29 VITALS — BP 124/78 | HR 86 | Temp 97.5°F | Ht 71.0 in | Wt 228.0 lb

## 2019-11-29 DIAGNOSIS — J432 Centrilobular emphysema: Secondary | ICD-10-CM | POA: Diagnosis not present

## 2019-11-29 DIAGNOSIS — E118 Type 2 diabetes mellitus with unspecified complications: Secondary | ICD-10-CM

## 2019-11-29 DIAGNOSIS — I5032 Chronic diastolic (congestive) heart failure: Secondary | ICD-10-CM | POA: Diagnosis not present

## 2019-11-29 DIAGNOSIS — I10 Essential (primary) hypertension: Secondary | ICD-10-CM

## 2019-11-29 DIAGNOSIS — I700 Atherosclerosis of aorta: Secondary | ICD-10-CM

## 2019-11-29 DIAGNOSIS — F331 Major depressive disorder, recurrent, moderate: Secondary | ICD-10-CM

## 2019-11-29 NOTE — Progress Notes (Signed)
Date:  11/29/2019   Name:  Bruce Richardson   DOB:  06/21/1951   MRN:  [id number]   Chief Complaint: Hypertension (4 MONTH FOLLOW UP. Had eye exam in the last few months. ) and Diabetes (A1C.)   There is no immunization history on file for this patient.  He was prescribed Januvia to [facility].  He says he never received it.  He has since changed his diet, not eating after 8 pm.  His blood sugars are averaging around 150.  He feels well.  He does not want to take more medication.  Diabetes He presents for his follow-up diabetic visit. He has type 2 diabetes mellitus. His disease course has been improving. Pertinent negatives for hypoglycemia include no headaches or tremors. Pertinent negatives for diabetes include no chest pain, no fatigue, no polydipsia and no polyuria. Symptoms are improving. Current diabetic treatment includes oral agent (monotherapy). He is compliant with treatment all of the time. He is following a generally healthy diet. He monitors blood glucose at home 1-2 x per day. His breakfast blood glucose is taken between 8-9 am. His breakfast blood glucose range is generally 140-180 mg/dl. An ACE inhibitor/angiotensin II receptor blocker is being taken. Eye exam is current.  Hypertension This is a chronic problem. The problem is controlled. Pertinent negatives include no chest pain, headaches, palpitations or shortness of breath. Past treatments include ACE inhibitors. The current treatment provides significant improvement. There are no compliance problems.    There is no immunization history on file for this patient.   Lab Results  Component Value Date   CREATININE 1.08 07/28/2019   BUN 13 07/28/2019   NA 140 07/28/2019   K 5.0 07/28/2019   CL 100 07/28/2019   CO2 25 07/28/2019   Lab Results  Component Value Date   CHOL 162 04/02/2019   HDL 37 (L) 04/02/2019   LDLCALC 111 (H) 04/02/2019   TRIG 68 04/02/2019   CHOLHDL 4.4 04/02/2019   No results found for: TSH Lab  Results  Component Value Date   HGBA1C 8.7 (H) 07/28/2019   Lab Results  Component Value Date   WBC 8.4 03/17/2019   HGB 16.1 03/17/2019   HCT 48.4 03/17/2019   MCV 85.5 03/17/2019   PLT 263 03/17/2019   Lab Results  Component Value Date   ALT 26 04/02/2019   AST 25 04/02/2019   ALKPHOS 96 04/02/2019   BILITOT 0.6 04/02/2019     Review of Systems  Constitutional: Negative for appetite change, fatigue and unexpected weight change.  Eyes: Negative for visual disturbance.  Respiratory: Negative for cough, shortness of breath and wheezing.   Cardiovascular: Negative for chest pain, palpitations and leg swelling.  Gastrointestinal: Negative for abdominal pain and blood in stool.  Endocrine: Negative for polydipsia and polyuria.  Genitourinary: Negative for dysuria and hematuria.  Skin: Negative for color change and rash.  Allergic/Immunologic: Negative for environmental allergies.  Neurological: Negative for tremors, numbness and headaches.  Psychiatric/Behavioral: Negative for dysphoric mood.    Patient Active Problem List   Diagnosis Date Noted  . Centrilobular emphysema ([place]) 01/26/2019  . Aortic atherosclerosis ([place]) 01/26/2019  . Echocardiogram shows left ventricular diastolic dysfunction 20/25/4270  . Moderate episode of recurrent major depressive disorder ([place]) 02/16/2018  . Erectile dysfunction 02/16/2018  . History of CVA (cerebrovascular accident) without residual deficits 08/13/2017  . BPH without obstruction/lower urinary tract symptoms 03/19/2017  . Chronic bilateral low back pain 11/14/2016  . 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 GERD (gastroesophageal reflux disease) 04/11/2016  . Obesity 04/11/2016  . Allergic rhinitis 08/04/2015  . Hypertension 07/04/2015  . Hyperlipidemia due to type 2 diabetes mellitus ([place]) 07/04/2015  . Type II diabetes mellitus with complication ([place]) 46/96/2952  . Tendinitis of elbow or forearm 04/26/2015   . History of repair of rotator cuff 08/31/2014    No Known Allergies  Past Surgical History:  Procedure Laterality Date  . CIRCUMCISION N/A 07/24/2015   Procedure: CIRCUMCISION ADULT;  Surgeon: Nickie Retort, MD;  Location: [facility] ORS;  Service: Urology;  Laterality: N/A;  . EYE SURGERY Right    right eye cataract surgery   . SHOULDER ARTHROSCOPY Bilateral   . SHOULDER ARTHROSCOPY Right 12/22/2014   Procedure: RIGHT SHOULDER ARTHROSCOPY /DECOMPRESSION/ROTATOR CUFF REPAIR OF RECURRENT ROTATOR CUFF TEAR;  Surgeon: Corky Mull, MD;  Location: [facility] ORS;  Service: Orthopedics;  Laterality: Right;    Social History   Tobacco Use  . Smoking status: Former Smoker    Packs/day: 1.50    Years: 25.00    Pack years: 37.50    Types: Cigarettes    Start date: 12/19/2001    Quit date: 07/03/2002    Years since quitting: 17.4  . Smokeless tobacco: Former Systems developer  . Tobacco comment: quit smoking in 06/2001  Substance Use Topics  . Alcohol use: No    Alcohol/week: 0.0 standard drinks  . Drug use: No     Medication list has been reviewed and updated.  Current Meds  Medication Sig  . albuterol (VENTOLIN HFA) 108 (90 Base) MCG/ACT inhaler Inhale 2 puffs into the lungs every 6 (six) hours as needed for wheezing or shortness of breath.  . Ascorbic Acid (VITAMIN C) 1000 MG tablet Take 1,000 mg by mouth daily.  Marland Kitchen aspirin EC 81 MG tablet Take 1 tablet (81 mg total) by mouth daily.  . baclofen (LIORESAL) 10 MG tablet TAKE 1 TABLET BY MOUTH 3  TIMES DAILY  . Blood Glucose Monitoring Suppl (ONE TOUCH ULTRA MINI) w/Device KIT 1 kit by Does not apply route as directed.  . clotrimazole-betamethasone (LOTRISONE) cream Apply 1 application topically 2 (two) times daily. Apply twice a day to groin rash  . fluticasone (FLONASE) 50 MCG/ACT nasal spray USE 1 SPRAY IN BOTH  NOSTRILS DAILY  . glucose blood test strip Check blood sugar twice daily. Dx.E11.9  . lisinopril (ZESTRIL) 40 MG tablet TAKE 1 TABLET BY  MOUTH  DAILY  . metFORMIN (GLUCOPHAGE) 1000 MG tablet TAKE 1 TABLET BY MOUTH  TWICE DAILY WITH A MEAL  . naproxen (NAPROSYN) 500 MG tablet Take 1 tablet (500 mg total) by mouth 2 (two) times daily with a meal. For 2-4 weeks then as needed  . Omega 3-6-9 Fatty Acids (OMEGA-3-6-9 PO) Take by mouth. 1600 mg  . ONETOUCH DELICA LANCETS 84X MISC 1 each by Does not apply route 2 (two) times daily. Dx: E11.9  . pantoprazole (PROTONIX) 40 MG tablet TAKE 1 TABLET BY MOUTH  DAILY  . rosuvastatin (CRESTOR) 40 MG tablet TAKE 1 TABLET BY MOUTH  DAILY  . tamsulosin (FLOMAX) 0.4 MG CAPS capsule TAKE 1 CAPSULE BY MOUTH  DAILY FOR PROSTATE  . vitamin B-12 (CYANOCOBALAMIN) 1000 MCG tablet Take 1,000 mcg by mouth daily.    PHQ 2/9 Scores 11/29/2019 10/13/2019 07/28/2019 04/02/2019  PHQ - 2 Score _0 PHQ- 9 Score _1 Exception Documentation - - - -  BP Readings from Last 3 Encounters:  11/29/19 124/78  07/28/19 (!) 146/80  04/02/19 134/70    Physical Exam Vitals and nursing note reviewed.  Constitutional:      General: He is not in acute distress.    Appearance: He is well-developed.  HENT:     Head: Normocephalic and atraumatic.  Cardiovascular:     Rate and Rhythm: Normal rate and regular rhythm.     Pulses: Normal pulses.     Heart sounds: No murmur.  Pulmonary:     Effort: Pulmonary effort is normal. No respiratory distress.  Musculoskeletal:     Cervical back: Normal range of motion.     Right lower leg: No edema.     Left lower leg: No edema.  Skin:    General: Skin is warm and dry.     Capillary Refill: Capillary refill takes less than 2 seconds.     Findings: No rash.  Neurological:     Mental Status: He is alert and oriented to person, place, and time.  Psychiatric:        Behavior: Behavior normal.        Thought Content: Thought content normal.     Wt Readings from Last 3 Encounters:  11/29/19 228 lb (103.4 kg)  10/13/19 225 lb (102.1 kg)  07/28/19 236 lb (107  kg)    BP 124/78   Pulse 86   Temp (!) 97.5 F (36.4 C) (Temporal)   Ht _0  (1.803 m)   Wt 228 lb (103.4 kg)   SpO2 97%   BMI 31.80 kg/m   Assessment and Plan: 1. Type II diabetes mellitus with complication (HCC) Clinically stable by exam and report without s/s of hypoglycemia. DM complicated by HTN, lipids. Tolerating medications -metformin - well without side effects or other concerns. Hopefully his A1C is improved. Discussed with patient the need to achieve less than 8.  He will continue diet and metformin but is not interested in additional medication at this time. - Hemoglobin A1c  2. Essential hypertension Clinically stable exam with well controlled BP on lisinopril. Tolerating medications without side effects at this time. Pt to continue current regimen and low sodium diet; benefits of regular exercise as able discussed.  3. Chronic diastolic congestive heart failure (HCC) Appears stable.  He denies CP, SOB, Dyspnea at rest.  He is able to walk on his treadmill with stable, mild shortness of breath with exertion.  4. Centrilobular emphysema (HCC) Using Ventolin MDI PRN Pt has declined all vaccinations  5. Moderate episode of recurrent major depressive disorder (HCC) Mild chronic depressive symptoms unchanged and not requiring medication at this time  6. Aortic atherosclerosis ([place]) On high intensity statin and aspirin   Partially dictated using Editor, commissioning. Any errors are unintentional.  Halina Maidens, MD [facility]  11/29/2019

## 2019-11-30 LAB — HEMOGLOBIN A1C
Est. average glucose Bld gHb Est-mCnc: 160 mg/dL
Hgb A1c MFr Bld: 7.2 % — ABNORMAL HIGH (ref 4.8–5.6)

## 2020-04-02 NOTE — Progress Notes (Signed)
Date:  04/03/2020   Name:  Bruce Richardson   DOB:  September 30, 1950   MRN:  [id number]   Chief Complaint: Annual Exam and Colon Cancer Screening (cologuard) Bruce Richardson is a 69 y.o. male who presents today for his Complete Annual Exam. He feels fairly well. He reports exercising yard work, walks sometimes on treadmill. He reports he is sleeping fairly well. He is planning to get the Covid vaccine.  Colonoscopy: 02/2017 Cologuard repeat due Eye exam due -last 10/2018 Foot exam due  There is no immunization history on file for this patient.  Hypertension The problem is controlled. Associated symptoms include neck pain. Pertinent negatives include no chest pain, headaches, palpitations or shortness of breath. Past treatments include ACE inhibitors. The current treatment provides significant improvement.  Diabetes He presents for his follow-up diabetic visit. He has type 2 diabetes mellitus. His disease course has been stable. Pertinent negatives for hypoglycemia include no dizziness, headaches or nervousness/anxiousness. Pertinent negatives for diabetes include no chest pain and no fatigue. Risk factors for coronary artery disease include obesity. Current diabetic treatment includes oral agent (monotherapy). He is compliant with treatment all of the time. His weight is stable. He monitors blood glucose at home 3-4 x per week. His breakfast blood glucose is taken between 6-7 am. His breakfast blood glucose range is generally 140-180 mg/dl. An ACE inhibitor/angiotensin II receptor blocker is being taken. Eye exam is not current.  Hyperlipidemia This is a chronic problem. The problem is controlled. Pertinent negatives include no chest pain, myalgias or shortness of breath. Current antihyperlipidemic treatment includes statins. The current treatment provides significant improvement of lipids.  COPD - 37.5 pk/yr hx but quit in 2003.  Has albuterol inhaler to use PRN.  Uses it most every day.  Still not  smoking.  No worsening of SOB.  Lab Results  Component Value Date   CREATININE 1.08 07/28/2019   BUN 13 07/28/2019   NA 140 07/28/2019   K 5.0 07/28/2019   CL 100 07/28/2019   CO2 25 07/28/2019   Lab Results  Component Value Date   CHOL 162 04/02/2019   HDL 37 (L) 04/02/2019   LDLCALC 111 (H) 04/02/2019   TRIG 68 04/02/2019   CHOLHDL 4.4 04/02/2019   No results found for: TSH Lab Results  Component Value Date   HGBA1C 7.2 (H) 11/29/2019   Lab Results  Component Value Date   WBC 8.4 03/17/2019   HGB 16.1 03/17/2019   HCT 48.4 03/17/2019   MCV 85.5 03/17/2019   PLT 263 03/17/2019   Lab Results  Component Value Date   ALT 26 04/02/2019   AST 25 04/02/2019   ALKPHOS 96 04/02/2019   BILITOT 0.6 04/02/2019     Review of Systems  Constitutional: Negative for appetite change, chills, diaphoresis, fatigue and unexpected weight change.  HENT: Negative for hearing loss, tinnitus, trouble swallowing and voice change.   Eyes: Negative for visual disturbance.  Respiratory: Negative for choking, shortness of breath and wheezing.   Cardiovascular: Negative for chest pain, palpitations and leg swelling.  Gastrointestinal: Negative for abdominal pain, blood in stool, constipation and diarrhea.  Genitourinary: Negative for difficulty urinating, dysuria and frequency.  Musculoskeletal: Positive for neck pain. Negative for arthralgias, back pain and myalgias.  Skin: Negative for color change and rash.  Neurological: Negative for dizziness, syncope and headaches.  Hematological: Negative for adenopathy.  Psychiatric/Behavioral: Negative for dysphoric mood and sleep disturbance. The patient is not nervous/anxious.  Patient Active Problem List   Diagnosis Date Noted  . Chronic diastolic congestive heart failure ([place]) 11/29/2019  . Centrilobular emphysema ([place]) 01/26/2019  . Aortic atherosclerosis ([place]) 01/26/2019  . Echocardiogram shows left ventricular diastolic dysfunction  65/10/5463  . Moderate episode of recurrent major depressive disorder ([place]) 02/16/2018  . Erectile dysfunction 02/16/2018  . History of CVA (cerebrovascular accident) without residual deficits 08/13/2017  . BPH without obstruction/lower urinary tract symptoms 03/19/2017  . Chronic bilateral low back pain 11/14/2016  . 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 GERD (gastroesophageal reflux disease) 04/11/2016  . Obesity 04/11/2016  . Allergic rhinitis 08/04/2015  . Essential hypertension 07/04/2015  . Hyperlipidemia due to type 2 diabetes mellitus ([place]) 07/04/2015  . Type II diabetes mellitus with complication ([place]) 68/07/7516  . Tendinitis of elbow or forearm 04/26/2015  . History of repair of rotator cuff 08/31/2014    No Known Allergies  Past Surgical History:  Procedure Laterality Date  . CIRCUMCISION N/A 07/24/2015   Procedure: CIRCUMCISION ADULT;  Surgeon: Nickie Retort, MD;  Location: [facility] ORS;  Service: Urology;  Laterality: N/A;  . EYE SURGERY Right    right eye cataract surgery   . SHOULDER ARTHROSCOPY Bilateral   . SHOULDER ARTHROSCOPY Right 12/22/2014   Procedure: RIGHT SHOULDER ARTHROSCOPY /DECOMPRESSION/ROTATOR CUFF REPAIR OF RECURRENT ROTATOR CUFF TEAR;  Surgeon: Corky Mull, MD;  Location: [facility] ORS;  Service: Orthopedics;  Laterality: Right;    Social History   Tobacco Use  . Smoking status: Former Smoker    Packs/day: 1.50    Years: 25.00    Pack years: 37.50    Types: Cigarettes    Start date: 12/19/2001    Quit date: 07/03/2002    Years since quitting: 17.7  . Smokeless tobacco: Former Systems developer  . Tobacco comment: quit smoking in 06/2001  Vaping Use  . Vaping Use: Never used  Substance Use Topics  . Alcohol use: No    Alcohol/week: 0.0 standard drinks  . Drug use: No     Medication list has been reviewed and updated.  Current Meds  Medication Sig  . albuterol (VENTOLIN HFA) 108 (90 Base) MCG/ACT inhaler Inhale 2 puffs into  the lungs every 6 (six) hours as needed for wheezing or shortness of breath.  . Ascorbic Acid (VITAMIN C) 1000 MG tablet Take 1,000 mg by mouth daily.  Marland Kitchen aspirin EC 81 MG tablet Take 1 tablet (81 mg total) by mouth daily.  . baclofen (LIORESAL) 10 MG tablet TAKE 1 TABLET BY MOUTH 3  TIMES DAILY  . Blood Glucose Monitoring Suppl (ONE TOUCH ULTRA MINI) w/Device KIT 1 kit by Does not apply route as directed.  . clotrimazole-betamethasone (LOTRISONE) cream Apply 1 application topically 2 (two) times daily. Apply twice a day to groin rash  . fluticasone (FLONASE) 50 MCG/ACT nasal spray USE 1 SPRAY IN BOTH  NOSTRILS DAILY  . glucose blood test strip Check blood sugar twice daily. Dx.E11.9  . ibuprofen (ADVIL) 200 MG tablet Take 200 mg by mouth every 6 (six) hours as needed.  Marland Kitchen lisinopril (ZESTRIL) 40 MG tablet TAKE 1 TABLET BY MOUTH  DAILY  . metFORMIN (GLUCOPHAGE) 1000 MG tablet TAKE 1 TABLET BY MOUTH  TWICE DAILY WITH A MEAL  . naproxen (NAPROSYN) 500 MG tablet Take 1 tablet (500 mg total) by mouth 2 (two) times daily with a meal. For 2-4 weeks then as needed  . Omega 3-6-9 Fatty Acids (OMEGA-3-6-9 PO) Take by mouth. 1600 mg  .  ONETOUCH DELICA LANCETS 51W MISC 1 each by Does not apply route 2 (two) times daily. Dx: E11.9  . pantoprazole (PROTONIX) 40 MG tablet TAKE 1 TABLET BY MOUTH  DAILY  . rosuvastatin (CRESTOR) 40 MG tablet TAKE 1 TABLET BY MOUTH  DAILY  . tamsulosin (FLOMAX) 0.4 MG CAPS capsule TAKE 1 CAPSULE BY MOUTH  DAILY FOR PROSTATE  . vitamin B-12 (CYANOCOBALAMIN) 1000 MCG tablet Take 1,000 mcg by mouth daily.    PHQ 2/9 Scores 04/03/2020 11/29/2019 10/13/2019 07/28/2019  PHQ - 2 Score '2 6 6 6  '$ PHQ- 9 Score '4 8 12 10  '$ Exception Documentation - - - -    GAD 7 : Generalized Anxiety Score 04/03/2020 11/29/2019 08/24/2018  Nervous, Anxious, on Edge 0 0 (No Data)  Control/stop worrying 1 0 -  Worry too much - different things 1 0 -  Trouble relaxing 2 0 -  Restless 0 0 -  Easily annoyed or  irritable 0 0 -  Afraid - awful might happen 0 0 -  Total GAD 7 Score 4 0 -  Anxiety Difficulty Not difficult at all Not difficult at all -    BP Readings from Last 3 Encounters:  04/03/20 132/76  11/29/19 124/78  07/28/19 (!) 146/80    Physical Exam Vitals and nursing note reviewed.  Constitutional:      Appearance: Normal appearance. He is well-developed.  HENT:     Head: Normocephalic.     Right Ear: Tympanic membrane, ear canal and external ear normal.     Left Ear: Tympanic membrane, ear canal and external ear normal.     Nose: Nose normal.     Mouth/Throat:     Pharynx: Uvula midline.  Eyes:     Conjunctiva/sclera: Conjunctivae normal.     Pupils: Pupils are equal, round, and reactive to light.  Neck:     Thyroid: No thyromegaly.     Vascular: No carotid bruit.  Cardiovascular:     Rate and Rhythm: Normal rate and regular rhythm.     Heart sounds: Normal heart sounds.  Pulmonary:     Effort: Pulmonary effort is normal.     Breath sounds: Normal breath sounds. No wheezing.  Chest:     Breasts:        Right: No mass.        Left: No mass.  Abdominal:     General: Bowel sounds are normal.     Palpations: Abdomen is soft.     Tenderness: There is no abdominal tenderness.  Musculoskeletal:     Cervical back: Neck supple. Spasms and tenderness present. No bony tenderness. Pain with movement present. Decreased range of motion.       Back:  Lymphadenopathy:     Cervical: No cervical adenopathy.  Skin:    General: Skin is warm and dry.  Neurological:     Mental Status: He is alert and oriented to person, place, and time.     Deep Tendon Reflexes: Reflexes are normal and symmetric.  Psychiatric:        Speech: Speech normal.        Behavior: Behavior normal.        Thought Content: Thought content normal.        Judgment: Judgment normal.     Wt Readings from Last 3 Encounters:  04/03/20 231 lb (104.8 kg)  11/29/19 228 lb (103.4 kg)  10/13/19 225 lb (102.1  kg)    BP 132/76   Pulse 99  Temp 98.6 F (37 C) (Oral)   Ht '5\' 11"'$  (1.803 m)   Wt 231 lb (104.8 kg)   SpO2 96%   BMI 32.22 kg/m   Assessment and Plan: 1. Annual physical exam Exam is normal except for weight. Encourage regular exercise and appropriate dietary changes. - POCT urinalysis dipstick  2. Prostate cancer screening DRE deferred - PSA  3. Colon cancer screening Cologuard due - Cologuard  4. Essential hypertension Clinically stable exam with well controlled BP on lisinopril 40 mg. Tolerating medications without side effects at this time. Pt to continue current regimen and low sodium diet; benefits of regular exercise as able discussed. - CBC with Differential/Platelet - TSH  5. Type II diabetes mellitus with complication (HCC) Clinically stable by exam and report without s/s of hypoglycemia. DM complicated by HTN. Tolerating medications well without side effects or other concerns. He is reminded to schedule DM eye exam annually He declines the pneumonia and flu vaccines - Comprehensive metabolic panel - Hemoglobin A1c  6. Hyperlipidemia due to type 2 diabetes mellitus ([place]) Tolerating statin medication without side effects at this time LDL is not at goal of < 70 on current maximal dose of crestor Continue same therapy without change at this time. - Lipid panel  7. Centrilobular emphysema (HCC) Remains free of tobacco Continue Albuterol PRN  8. Aortic atherosclerosis (HCC) On statin and aspirin therapy - Lipid panel  9. Neck muscle spasm Continue Baclofen, heat and topical rubs   Partially dictated using Editor, commissioning. Any errors are unintentional.  Halina Maidens, MD [facility]  04/03/2020

## 2020-04-03 ENCOUNTER — Encounter: Payer: Self-pay | Admitting: Internal Medicine

## 2020-04-03 ENCOUNTER — Other Ambulatory Visit
Admission: RE | Admit: 2020-04-03 | Discharge: 2020-04-03 | Disposition: A | Discharge status: Home / Self Care | Payer: Medicare Other | Attending: Internal Medicine | Admitting: Internal Medicine

## 2020-04-03 ENCOUNTER — Other Ambulatory Visit: Payer: Self-pay

## 2020-04-03 ENCOUNTER — Ambulatory Visit (INDEPENDENT_AMBULATORY_CARE_PROVIDER_SITE_OTHER): Payer: Medicare Other | Admitting: Internal Medicine

## 2020-04-03 VITALS — BP 132/76 | HR 99 | Temp 98.6°F | Ht 71.0 in | Wt 231.0 lb

## 2020-04-03 DIAGNOSIS — I10 Essential (primary) hypertension: Secondary | ICD-10-CM | POA: Diagnosis not present

## 2020-04-03 DIAGNOSIS — E785 Hyperlipidemia, unspecified: Secondary | ICD-10-CM | POA: Insufficient documentation

## 2020-04-03 DIAGNOSIS — I700 Atherosclerosis of aorta: Secondary | ICD-10-CM | POA: Insufficient documentation

## 2020-04-03 DIAGNOSIS — Z1211 Encounter for screening for malignant neoplasm of colon: Secondary | ICD-10-CM | POA: Insufficient documentation

## 2020-04-03 DIAGNOSIS — E1169 Type 2 diabetes mellitus with other specified complication: Secondary | ICD-10-CM

## 2020-04-03 DIAGNOSIS — Z0000 Encounter for general adult medical examination without abnormal findings: Secondary | ICD-10-CM | POA: Diagnosis not present

## 2020-04-03 DIAGNOSIS — Z125 Encounter for screening for malignant neoplasm of prostate: Secondary | ICD-10-CM | POA: Diagnosis not present

## 2020-04-03 DIAGNOSIS — J432 Centrilobular emphysema: Secondary | ICD-10-CM | POA: Diagnosis not present

## 2020-04-03 DIAGNOSIS — E118 Type 2 diabetes mellitus with unspecified complications: Secondary | ICD-10-CM | POA: Diagnosis not present

## 2020-04-03 DIAGNOSIS — M62838 Other muscle spasm: Secondary | ICD-10-CM

## 2020-04-03 LAB — CBC WITH DIFFERENTIAL/PLATELET
Abs Immature Granulocytes: 0.01 10*3/uL (ref 0.00–0.07)
Basophils Absolute: 0.1 10*3/uL (ref 0.0–0.1)
Basophils Relative: 1 %
Eosinophils Absolute: 0.3 10*3/uL (ref 0.0–0.5)
Eosinophils Relative: 4 %
HCT: 45.5 % (ref 39.0–52.0)
Hemoglobin: 15.3 g/dL (ref 13.0–17.0)
Immature Granulocytes: 0 %
Lymphocytes Relative: 37 %
Lymphs Abs: 2.5 10*3/uL (ref 0.7–4.0)
MCH: 29 pg (ref 26.0–34.0)
MCHC: 33.6 g/dL (ref 30.0–36.0)
MCV: 86.2 fL (ref 80.0–100.0)
Monocytes Absolute: 0.6 10*3/uL (ref 0.1–1.0)
Monocytes Relative: 9 %
Neutro Abs: 3.4 10*3/uL (ref 1.7–7.7)
Neutrophils Relative %: 49 %
Platelets: 251 10*3/uL (ref 150–400)
RBC: 5.28 MIL/uL (ref 4.22–5.81)
RDW: 12.9 % (ref 11.5–15.5)
WBC: 6.9 10*3/uL (ref 4.0–10.5)
nRBC: 0 % (ref 0.0–0.2)

## 2020-04-03 LAB — COMPREHENSIVE METABOLIC PANEL
ALT: 17 U/L (ref 0–44)
AST: 17 U/L (ref 15–41)
Albumin: 4.2 g/dL (ref 3.5–5.0)
Alkaline Phosphatase: 53 U/L (ref 38–126)
Anion gap: 8 (ref 5–15)
BUN: 23 mg/dL (ref 8–23)
CO2: 24 mmol/L (ref 22–32)
Calcium: 9.1 mg/dL (ref 8.9–10.3)
Chloride: 105 mmol/L (ref 98–111)
Creatinine, Ser: 1.09 mg/dL (ref 0.61–1.24)
GFR calc Af Amer: >60 mL/min (ref >60)
GFR calc non Af Amer: >60 mL/min (ref >60)
Glucose, Bld: 195 mg/dL — ABNORMAL HIGH (ref 70–99)
Potassium: 3.9 mmol/L (ref 3.5–5.1)
Sodium: 137 mmol/L (ref 135–145)
Total Bilirubin: 1.1 mg/dL (ref 0.3–1.2)
Total Protein: 8.1 g/dL (ref 6.5–8.1)

## 2020-04-03 LAB — POCT URINALYSIS DIPSTICK
Bilirubin, UA: NEGATIVE
Blood, UA: NEGATIVE
Glucose, UA: NEGATIVE
Ketones, UA: NEGATIVE
Leukocytes, UA: NEGATIVE
Nitrite, UA: NEGATIVE
Protein, UA: POSITIVE — AB
Spec Grav, UA: >=1.03 — AB (ref 1.010–1.025)
Urobilinogen, UA: 0.2 E.U./dL
pH, UA: 6 (ref 5.0–8.0)

## 2020-04-03 LAB — LIPID PANEL
Cholesterol: 139 mg/dL (ref 0–200)
HDL: 34 mg/dL — ABNORMAL LOW (ref >40)
LDL Cholesterol: 84 mg/dL (ref 0–99)
Total CHOL/HDL Ratio: 4.1 RATIO
Triglycerides: 104 mg/dL (ref <150)
VLDL: 21 mg/dL (ref 0–40)

## 2020-04-03 LAB — HEMOGLOBIN A1C
Hgb A1c MFr Bld: 7.7 % — ABNORMAL HIGH (ref 4.8–5.6)
Mean Plasma Glucose: 174.29 mg/dL

## 2020-04-03 LAB — TSH: TSH: 2.879 u[IU]/mL (ref 0.350–4.500)

## 2020-04-03 NOTE — Patient Instructions (Addendum)
See the Eye Doctor every year.  Get the Covid Vaccine.  Submit the Cologuard test as soon as you received it.

## 2020-04-05 LAB — PSA: Prostatic Specific Antigen: 0.41 ng/mL (ref 0.00–4.00)

## 2020-04-11 DIAGNOSIS — Z1211 Encounter for screening for malignant neoplasm of colon: Secondary | ICD-10-CM | POA: Diagnosis not present

## 2020-04-11 LAB — COLOGUARD: Cologuard: NEGATIVE

## 2020-04-18 LAB — COLOGUARD: COLOGUARD: NEGATIVE

## 2020-04-19 ENCOUNTER — Telehealth: Payer: Self-pay | Admitting: Internal Medicine

## 2020-04-19 NOTE — Telephone Encounter (Signed)
Cologuard is negative,  Repeat in 3 yrs.

## 2020-04-19 NOTE — Telephone Encounter (Signed)
Called pt left VM with result. Negative Cologuard will repeat in 3 years. Pts name was stated on VM.  KP

## 2020-06-23 ENCOUNTER — Other Ambulatory Visit: Payer: Self-pay | Admitting: Internal Medicine

## 2020-06-23 DIAGNOSIS — K219 Gastro-esophageal reflux disease without esophagitis: Secondary | ICD-10-CM

## 2020-06-23 DIAGNOSIS — E1169 Type 2 diabetes mellitus with other specified complication: Secondary | ICD-10-CM

## 2020-06-23 DIAGNOSIS — N400 Enlarged prostate without lower urinary tract symptoms: Secondary | ICD-10-CM

## 2020-06-26 ENCOUNTER — Emergency Department
Admission: EM | Admit: 2020-06-26 | Discharge: 2020-06-26 | Disposition: A | Discharge status: Home / Self Care | Payer: Medicare Other | Attending: Student in an Organized Health Care Education/Training Program | Admitting: Student in an Organized Health Care Education/Training Program

## 2020-06-26 ENCOUNTER — Emergency Department: Payer: Medicare Other

## 2020-06-26 ENCOUNTER — Other Ambulatory Visit: Payer: Self-pay

## 2020-06-26 DIAGNOSIS — G319 Degenerative disease of nervous system, unspecified: Secondary | ICD-10-CM | POA: Diagnosis not present

## 2020-06-26 DIAGNOSIS — I5032 Chronic diastolic (congestive) heart failure: Secondary | ICD-10-CM | POA: Insufficient documentation

## 2020-06-26 DIAGNOSIS — M47812 Spondylosis without myelopathy or radiculopathy, cervical region: Secondary | ICD-10-CM | POA: Diagnosis not present

## 2020-06-26 DIAGNOSIS — R519 Headache, unspecified: Secondary | ICD-10-CM | POA: Diagnosis not present

## 2020-06-26 DIAGNOSIS — M542 Cervicalgia: Secondary | ICD-10-CM | POA: Insufficient documentation

## 2020-06-26 DIAGNOSIS — J811 Chronic pulmonary edema: Secondary | ICD-10-CM | POA: Diagnosis not present

## 2020-06-26 DIAGNOSIS — I110 Hypertensive heart disease with heart failure: Secondary | ICD-10-CM | POA: Insufficient documentation

## 2020-06-26 DIAGNOSIS — I10 Essential (primary) hypertension: Secondary | ICD-10-CM

## 2020-06-26 DIAGNOSIS — W01198A Fall on same level from slipping, tripping and stumbling with subsequent striking against other object, initial encounter: Secondary | ICD-10-CM | POA: Diagnosis not present

## 2020-06-26 DIAGNOSIS — Z7984 Long term (current) use of oral hypoglycemic drugs: Secondary | ICD-10-CM | POA: Diagnosis not present

## 2020-06-26 DIAGNOSIS — M4802 Spinal stenosis, cervical region: Secondary | ICD-10-CM | POA: Diagnosis not present

## 2020-06-26 DIAGNOSIS — E119 Type 2 diabetes mellitus without complications: Secondary | ICD-10-CM | POA: Diagnosis not present

## 2020-06-26 DIAGNOSIS — Z79899 Other long term (current) drug therapy: Secondary | ICD-10-CM | POA: Insufficient documentation

## 2020-06-26 DIAGNOSIS — I6782 Cerebral ischemia: Secondary | ICD-10-CM | POA: Diagnosis not present

## 2020-06-26 DIAGNOSIS — Z87891 Personal history of nicotine dependence: Secondary | ICD-10-CM | POA: Insufficient documentation

## 2020-06-26 DIAGNOSIS — Y9301 Activity, walking, marching and hiking: Secondary | ICD-10-CM | POA: Insufficient documentation

## 2020-06-26 DIAGNOSIS — Z7982 Long term (current) use of aspirin: Secondary | ICD-10-CM | POA: Diagnosis not present

## 2020-06-26 DIAGNOSIS — Y92009 Unspecified place in unspecified non-institutional (private) residence as the place of occurrence of the external cause: Secondary | ICD-10-CM | POA: Diagnosis not present

## 2020-06-26 DIAGNOSIS — M2578 Osteophyte, vertebrae: Secondary | ICD-10-CM | POA: Diagnosis not present

## 2020-06-26 LAB — CBC WITH DIFFERENTIAL/PLATELET
Abs Immature Granulocytes: 0.02 10*3/uL (ref 0.00–0.07)
Basophils Absolute: 0.1 10*3/uL (ref 0.0–0.1)
Basophils Relative: 1 %
Eosinophils Absolute: 0.3 10*3/uL (ref 0.0–0.5)
Eosinophils Relative: 4 %
HCT: 47.1 % (ref 39.0–52.0)
Hemoglobin: 15.6 g/dL (ref 13.0–17.0)
Immature Granulocytes: 0 %
Lymphocytes Relative: 31 %
Lymphs Abs: 2.3 10*3/uL (ref 0.7–4.0)
MCH: 28.8 pg (ref 26.0–34.0)
MCHC: 33.1 g/dL (ref 30.0–36.0)
MCV: 86.9 fL (ref 80.0–100.0)
Monocytes Absolute: 0.7 10*3/uL (ref 0.1–1.0)
Monocytes Relative: 9 %
Neutro Abs: 4.1 10*3/uL (ref 1.7–7.7)
Neutrophils Relative %: 55 %
Platelets: 244 10*3/uL (ref 150–400)
RBC: 5.42 MIL/uL (ref 4.22–5.81)
RDW: 12.7 % (ref 11.5–15.5)
WBC: 7.4 10*3/uL (ref 4.0–10.5)
nRBC: 0 % (ref 0.0–0.2)

## 2020-06-26 LAB — BASIC METABOLIC PANEL
Anion gap: 12 (ref 5–15)
BUN: 17 mg/dL (ref 8–23)
CO2: 27 mmol/L (ref 22–32)
Calcium: 9.6 mg/dL (ref 8.9–10.3)
Chloride: 99 mmol/L (ref 98–111)
Creatinine, Ser: 1.1 mg/dL (ref 0.61–1.24)
GFR, Estimated: >60 mL/min (ref >60)
Glucose, Bld: 217 mg/dL — ABNORMAL HIGH (ref 70–99)
Potassium: 3.9 mmol/L (ref 3.5–5.1)
Sodium: 138 mmol/L (ref 135–145)

## 2020-06-26 LAB — TROPONIN I (HIGH SENSITIVITY): Troponin I (High Sensitivity): 6 ng/L (ref <18)

## 2020-06-26 MED ORDER — HYDROCODONE-ACETAMINOPHEN 5-325 MG PO TABS
1.0000 | ORAL_TABLET | Freq: Once | ORAL | Status: AC
  Administered 2020-06-26: 1 via ORAL
  Filled 2020-06-26: qty 1

## 2020-06-26 MED ORDER — AMLODIPINE BESYLATE 5 MG PO TABS
5.0000 mg | ORAL_TABLET | Freq: Once | ORAL | Status: AC
  Administered 2020-06-26: 5 mg via ORAL
  Filled 2020-06-26: qty 1

## 2020-06-26 MED ORDER — OXYCODONE-ACETAMINOPHEN 5-325 MG PO TABS: 1.0000 | ORAL_TABLET | ORAL | 0 refills | Status: DC | PRN

## 2020-06-26 MED ORDER — AMLODIPINE BESYLATE 5 MG PO TABS
5.0000 mg | ORAL_TABLET | Freq: Every day | ORAL | 0 refills | Status: DC
Start: 2020-06-26 — End: 2020-06-28

## 2020-06-26 NOTE — ED Triage Notes (Signed)
Pt encouraged to sit in a WC but pt refused and stated "When I fall you can put me in a wheelchair"

## 2020-06-26 NOTE — ED Notes (Signed)
This RN discussed pt with Dr Stafford  

## 2020-06-26 NOTE — ED Provider Notes (Signed)
[facility] Emergency Department Provider Note    First MD Initiated Contact with Patient 06/26/20 1901     (approximate)  I have reviewed the triage vital signs and the nursing notes.   HISTORY  Chief Complaint Dizziness and Weakness    HPI Bruce Richardson is a 69 y.o. male the below listed past medical history presents to the ER for evaluation of bilateral neck pain as well as concern over high blood pressure for the past week.  States that the symptoms all started after he had a mechanical fall while walking into his house.  States he was reaching up to unlock his door and saw salamander on the wall got scared and fell back.  Lost his balance landing on his back and striking his head.  Did not get knocked out.  Did not seek medical attention after that event but has had worsening neck pain and strain worsened with movement.  No numbness or tingling.  He is not having any chest pain or shortness of breath.  Has been taking ibuprofen as well as Aleve without any improvement in his symptoms.  Would like something stronger for his neck pain.  Denies any diaphoresis with this.  No abdominal pain.    Past Medical History:  Diagnosis Date  . Arthritis   . Bulging lumbar disc    lower back  . Complete rotator cuff rupture of left shoulder 09/23/2014  . Complication of anesthesia    kept moving while under anesthesia during colonoscopy, hard to put under.  . Diabetes mellitus without complication ([place])   . GERD (gastroesophageal reflux disease)   . Hypertension   . Injury of tendon of upper extremity 12/23/2014  . Non-retracting foreskin 07/04/2015  . Stroke [facility])    Family History  Problem Relation Age of Onset  . Alzheimer's disease Mother   . Diabetes Mother   . Hypertension Mother   . Cancer Father   . Heart failure Brother   . Diabetes Brother   . Hypertension Brother   . Heart disease Brother   . Prostate cancer Other        unknown  . Bladder Cancer  Other        unknown  . Heart disease Son   . Diabetes Sister    Past Surgical History:  Procedure Laterality Date  . CIRCUMCISION N/A 07/24/2015   Procedure: CIRCUMCISION ADULT;  Surgeon: Nickie Retort, MD;  Location: [facility] ORS;  Service: Urology;  Laterality: N/A;  . EYE SURGERY Right    right eye cataract surgery   . SHOULDER ARTHROSCOPY Bilateral   . SHOULDER ARTHROSCOPY Right 12/22/2014   Procedure: RIGHT SHOULDER ARTHROSCOPY /DECOMPRESSION/ROTATOR CUFF REPAIR OF RECURRENT ROTATOR CUFF TEAR;  Surgeon: Corky Mull, MD;  Location: [facility] ORS;  Service: Orthopedics;  Laterality: Right;   Patient Active Problem List   Diagnosis Date Noted  . Chronic diastolic congestive heart failure ([place]) 11/29/2019  . Centrilobular emphysema ([place]) 01/26/2019  . Aortic atherosclerosis ([place]) 01/26/2019  . Echocardiogram shows left ventricular diastolic dysfunction 03/88/8280  . Moderate episode of recurrent major depressive disorder ([place]) 02/16/2018  . Erectile dysfunction 02/16/2018  . History of CVA (cerebrovascular accident) without residual deficits 08/13/2017  . BPH without obstruction/lower urinary tract symptoms 03/19/2017  . Chronic bilateral low back pain 11/14/2016  . 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 GERD (gastroesophageal reflux disease) 04/11/2016  . Obesity 04/11/2016  . Allergic rhinitis 08/04/2015  . Essential hypertension  07/04/2015  . Hyperlipidemia due to type 2 diabetes mellitus ([place]) 07/04/2015  . Type II diabetes mellitus with complication ([place]) 74/94/4967  . Tendinitis of elbow or forearm 04/26/2015  . History of repair of rotator cuff 08/31/2014      Prior to Admission medications   Medication Sig Start Date End Date Taking? Authorizing Provider  albuterol (VENTOLIN HFA) 108 (90 Base) MCG/ACT inhaler Inhale 2 puffs into the lungs every 6 (six) hours as needed for wheezing or shortness of breath. 04/14/19   Glean Hess, MD  amLODipine  (NORVASC) 5 MG tablet Take 1 tablet (5 mg total) by mouth daily. 06/26/20 06/26/21  Merlyn Lot, MD  Ascorbic Acid (VITAMIN C) 1000 MG tablet Take 1,000 mg by mouth daily.    [provider]  aspirin EC 81 MG tablet Take 1 tablet (81 mg total) by mouth daily. 01/29/17   Karamalegos, Devonne Doughty, DO  baclofen (LIORESAL) 10 MG tablet TAKE 1 TABLET BY MOUTH 3  TIMES DAILY 09/18/19   Glean Hess, MD  Blood Glucose Monitoring Suppl (ONE TOUCH ULTRA MINI) w/Device KIT 1 kit by Does not apply route as directed. 12/12/17   Karamalegos, Devonne Doughty, DO  clotrimazole-betamethasone (LOTRISONE) cream Apply 1 application topically 2 (two) times daily. Apply twice a day to groin rash 07/28/19   Glean Hess, MD  fluticasone [facility]) 50 MCG/ACT nasal spray USE 1 SPRAY IN BOTH  NOSTRILS DAILY 03/29/19   Glean Hess, MD  glucose blood test strip Check blood sugar twice daily. Dx.E11.9 12/12/17   Karamalegos, Devonne Doughty, DO  ibuprofen (ADVIL) 200 MG tablet Take 200 mg by mouth every 6 (six) hours as needed.    [provider]  lisinopril (ZESTRIL) 40 MG tablet TAKE 1 TABLET BY MOUTH  DAILY 06/25/19   Glean Hess, MD  metFORMIN (GLUCOPHAGE) 1000 MG tablet TAKE 1 TABLET BY MOUTH  TWICE DAILY WITH A MEAL 06/23/20   Glean Hess, MD  naproxen (NAPROSYN) 500 MG tablet Take 1 tablet (500 mg total) by mouth 2 (two) times daily with a meal. For 2-4 weeks then as needed 01/26/19   Glean Hess, MD  Omega 3-6-9 Fatty Acids (OMEGA-3-6-9 PO) Take by mouth. 1600 mg    [provider]  [facility] DELICA LANCETS 59F MISC 1 each by Does not apply route 2 (two) times daily. Dx: E11.9 04/30/16   Olin Hauser, DO  oxyCODONE-acetaminophen (PERCOCET) 5-325 MG tablet Take 1 tablet by mouth every 4 (four) hours as needed for severe pain. 06/26/20 06/26/21  Merlyn Lot, MD  pantoprazole (PROTONIX) 40 MG tablet TAKE 1 TABLET BY MOUTH  DAILY 06/23/20   Glean Hess, MD   rosuvastatin (CRESTOR) 40 MG tablet TAKE 1 TABLET BY MOUTH  DAILY 06/23/20   Glean Hess, MD  tamsulosin (FLOMAX) 0.4 MG CAPS capsule TAKE 1 CAPSULE BY MOUTH  DAILY FOR PROSTATE 06/23/20   Glean Hess, MD  vitamin B-12 (CYANOCOBALAMIN) 1000 MCG tablet Take 1,000 mcg by mouth daily.    [provider]    Allergies Patient has no known allergies.    Social History Social History   Tobacco Use  . Smoking status: Former Smoker    Packs/day: 1.50    Years: 25.00    Pack years: 37.50    Types: Cigarettes    Start date: 12/19/2001    Quit date: 07/03/2002    Years since quitting: 17.9  . Smokeless tobacco: Former Systems developer  .  Tobacco comment: quit smoking in 06/2001  Vaping Use  . Vaping Use: Never used  Substance Use Topics  . Alcohol use: No    Alcohol/week: 0.0 standard drinks  . Drug use: No    Review of Systems Patient denies headaches, rhinorrhea, blurry vision, numbness, shortness of breath, chest pain, edema, cough, abdominal pain, nausea, vomiting, diarrhea, dysuria, fevers, rashes or hallucinations unless otherwise stated above in HPI. ____________________________________________   PHYSICAL EXAM:  VITAL SIGNS: Vitals:   06/26/20 1350 06/26/20 1815  BP: (!) 140/100 (!) 155/85  Pulse: (!) 110 (!) 105  Resp: 20 18  Temp: 98.8 F (37.1 C)   SpO2: 96% 98%    Constitutional: Alert and oriented.  Eyes: Conjunctivae are normal.  Head: Atraumatic. Nose: No congestion/rhinnorhea. Mouth/Throat: Mucous membranes are moist.   Neck: No stridor. Painless ROM.  Cardiovascular: Normal rate, regular rhythm. Grossly normal heart sounds.  Good peripheral circulation. Respiratory: Normal respiratory effort.  No retractions. Lungs CTAB. Gastrointestinal: Soft and nontender. No distention. No abdominal bruits. No CVA tenderness. Genitourinary:  Musculoskeletal: No lower extremity tenderness nor edema.  No joint effusions. Neurologic:  Normal speech and language.  No gross focal neurologic deficits are appreciated. No facial droop Skin:  Skin is warm, dry and intact. No rash noted. Psychiatric: Mood and affect are normal. Speech and behavior are normal.  ____________________________________________   LABS (all labs ordered are listed, but only abnormal results are displayed)  Results for orders placed or performed during the hospital encounter of 06/26/20 (from the past 24 hour(s))  CBC with Differential     Status: None   Collection Time: 06/26/20  1:57 PM  Result Value Ref Range   WBC 7.4 4.0 - 10.5 K/uL   RBC 5.42 4.22 - 5.81 MIL/uL   Hemoglobin 15.6 13.0 - 17.0 g/dL   HCT 47.1 39 - 52 %   MCV 86.9 80.0 - 100.0 fL   MCH 28.8 26.0 - 34.0 pg   MCHC 33.1 30.0 - 36.0 g/dL   RDW 12.7 11.5 - 15.5 %   Platelets 244 150 - 400 K/uL   nRBC 0.0 0.0 - 0.2 %   Neutrophils Relative % 55 %   Neutro Abs 4.1 1.7 - 7.7 K/uL   Lymphocytes Relative 31 %   Lymphs Abs 2.3 0.7 - 4.0 K/uL   Monocytes Relative 9 %   Monocytes Absolute 0.7 0.1 - 1.0 K/uL   Eosinophils Relative 4 %   Eosinophils Absolute 0.3 0.0 - 0.5 K/uL   Basophils Relative 1 %   Basophils Absolute 0.1 0.0 - 0.1 K/uL   Immature Granulocytes 0 %   Abs Immature Granulocytes 0.02 0.00 - 0.07 K/uL  Basic metabolic panel     Status: Abnormal   Collection Time: 06/26/20  1:57 PM  Result Value Ref Range   Sodium 138 135 - 145 mmol/L   Potassium 3.9 3.5 - 5.1 mmol/L   Chloride 99 98 - 111 mmol/L   CO2 27 22 - 32 mmol/L   Glucose, Bld 217 (H) 70 - 99 mg/dL   BUN 17 8 - 23 mg/dL   Creatinine, Ser 1.10 0.61 - 1.24 mg/dL   Calcium 9.6 8.9 - 10.3 mg/dL   GFR, Estimated >60 >60 mL/min   Anion gap 12 5 - 15  Troponin I (High Sensitivity)     Status: None   Collection Time: 06/26/20  1:57 PM  Result Value Ref Range   Troponin I (High Sensitivity) 6 <18 ng/L  ____________________________________________  EKG My review and personal interpretation at Time: 13:46   Indication: htn  Rate: 120   Rhythm: sinus Axis: normal Other: normal intervals, no stemi ____________________________________________  RADIOLOGY  I personally reviewed all radiographic images ordered to evaluate for the above acute complaints and reviewed radiology reports and findings.  These findings were personally discussed with the patient.  Please see medical record for radiology report.  ____________________________________________   PROCEDURES  Procedure(s) performed:  Procedures    Critical Care performed: no ____________________________________________   INITIAL IMPRESSION / ASSESSMENT AND PLAN / ED COURSE  Pertinent labs & imaging results that were available during my care of the patient were reviewed by me and considered in my medical decision making (see chart for details).   DDX: IPH, SDH, concussion, whiplash, fracture, dislocation, hypertensive urgency, ACS, dissection, electrolyte abnormality, medication effect  Bruce Richardson is a 69 y.o. who presents to the ED with symptoms as described above.  Patient nontoxic-appearing.  No focal deficits.  Mildly hypertensive mildly tachycardic.  Symptoms seem most consistent with whiplash.  Does not have any midline cervical spine tenderness that is reproducible with palpation of the lateral aspects of paracervical muscles.  On auscultation there is mid no bruits.  There is no sign of external trauma.  Neuro exam is reassuring.  Hypertensive persistence may be secondary to taking NSAIDs in the setting of his known hypertensive disease has been compliant with his medications.  Blood work is otherwise reassuring.  Clinical Course as of Jun 27 2011  Mon Jun 26, 2020  1917 NEUT#: 4.1 [PR]  2010 Patient's blood pressure rechecked and is again systolic 338.  He is denying any chest pain or shortness of breath right now.  He is not hypoxic.  His exam does not show any signs or findings to suggest CHF or acute pulmonary edema.  Suspect findings on chest x-ray  likely chronic.  Troponin is negative.  This not consistent with dissection.  Feel this is most consistent with cervical strain given mechanism of injury.  I do not feel that further diagnostic testing clinically indicated now that we are 1 week out.  We will give prescription for pain medication.  We will give a short prescription for amlodipine as he is reporting some elevated blood pressures at home and encouraged him to have close follow-up for recheck and further medical management by his PCP.  He does not want these medications sent to local pharmacy wants them sent to mail in pharmacy.  Have discussed with the patient and available family all diagnostics and treatments performed thus far and all questions were answered to the best of my ability. The patient demonstrates understanding and agreement with plan.    [PR]    Clinical Course User Index [PR] Merlyn Lot, MD    The patient was evaluated in Emergency Department today for the symptoms described in the history of present illness. He/she was evaluated in the context of the global COVID-19 pandemic, which necessitated consideration that the patient might be at risk for infection with the SARS-CoV-2 virus that causes COVID-19. Institutional protocols and algorithms that pertain to the evaluation of patients at risk for COVID-19 are in a state of rapid change based on information released by regulatory bodies including the CDC and federal and state organizations. These policies and algorithms were followed during the patient's care in the ED.  As part of my medical decision making, I reviewed the following data within the [place]  notes reviewed and incorporated, Labs reviewed, notes from prior ED visits and [place] Controlled Substance Database   ____________________________________________   FINAL CLINICAL IMPRESSION(S) / ED DIAGNOSES  Final diagnoses:  Acute neck pain  Hypertension, unspecified type       NEW MEDICATIONS STARTED DURING THIS VISIT:  New Prescriptions   AMLODIPINE (NORVASC) 5 MG TABLET    Take 1 tablet (5 mg total) by mouth daily.   OXYCODONE-ACETAMINOPHEN (PERCOCET) 5-325 MG TABLET    Take 1 tablet by mouth every 4 (four) hours as needed for severe pain.     Note:  This document was prepared using Dragon voice recognition software and may include unintentional dictation errors.    Merlyn Lot, MD 06/26/20 2012

## 2020-06-26 NOTE — ED Triage Notes (Signed)
Pt to ED POV for multiple complaints.  States fell out of building a month ago, has neck pain.  States has been weak and dizzy for 3 days.  States found out today his BP is high when he took it at home 190-200 systolic, takes lisinopril Denies CP Pt ambulatory to triage with cane.  Clear speech.

## 2020-06-28 ENCOUNTER — Other Ambulatory Visit: Payer: Self-pay

## 2020-06-28 ENCOUNTER — Ambulatory Visit: Payer: Self-pay | Admitting: *Deleted

## 2020-06-28 ENCOUNTER — Telehealth: Payer: Self-pay

## 2020-06-28 MED ORDER — AMLODIPINE BESYLATE 5 MG PO TABS: 5.0000 mg | ORAL_TABLET | Freq: Every day | ORAL | 0 refills | Status: DC

## 2020-06-28 NOTE — Telephone Encounter (Signed)
amLODipine (NORVASC) 5 MG tablet   Please send over to [facility] in [place].

## 2020-06-28 NOTE — Telephone Encounter (Signed)
Per intitial encounter, "Patient was seen in the ED 06/26/2020 due to falling out a building, patient experiencing head aches, sugar level was high and neck pain. ED advised patient to follow up as soon as possible, patient awaiting a call back from practice for a possible work in. Patient seeking clinical advice prior to the appointment. Patient states not feeling well."; contacted pt regarding his symptoms; the pt says he feels like he has a strobe light in his head x 1 week; the pt said he previously discussed this and his other symptoms with the ED physician; he says the MD told him he may have whiplash; the pt says he fell a month ago about 6 feet; the pt says he feels the same as when he went to the ED and his symptoms have not worsened; spoke with Marchelle Folks in regards to scheduling the pt; per Dr Judithann Graves the pt should continue his amlodipine; he has not taken any amlodipine since ED discharge because he can not afford the medication and he gets it free through mail order it is due to arrive on 07/05/20; also per Dr Judithann Graves the pt should be seen in the office on 07/12/20; pt scheduled per Marchelle Folks 07/12/20 at 0820; pt also requested amlodipine be sent to [facility] in [place], [place]; pt also advised that he should seek evaluation at ED or Urgent Care for peisitant or worsening symptoms; he verbalized understanding; will route to office for notification.  Reason for Disposition . Requesting regular office appointment  Answer Assessment - Initial Assessment Questions 1. REASON FOR CALL or QUESTION: "What is your reason for calling today?" or "How can I best help you?" or "What question do you have that I can help answer?"     Pt would like advice on symptoms while waiting to hear from office  Protocols used: INFORMATION ONLY CALL - NO TRIAGE-A-AH

## 2020-07-04 ENCOUNTER — Encounter: Payer: Self-pay | Admitting: Internal Medicine

## 2020-07-12 ENCOUNTER — Other Ambulatory Visit: Payer: Self-pay

## 2020-07-12 ENCOUNTER — Ambulatory Visit (INDEPENDENT_AMBULATORY_CARE_PROVIDER_SITE_OTHER): Payer: Medicare Other | Admitting: Internal Medicine

## 2020-07-12 ENCOUNTER — Encounter: Payer: Self-pay | Admitting: Internal Medicine

## 2020-07-12 VITALS — BP 144/78 | HR 90 | Temp 98.1°F | Ht 70.0 in | Wt 225.0 lb

## 2020-07-12 DIAGNOSIS — E118 Type 2 diabetes mellitus with unspecified complications: Secondary | ICD-10-CM | POA: Diagnosis not present

## 2020-07-12 DIAGNOSIS — M5030 Other cervical disc degeneration, unspecified cervical region: Secondary | ICD-10-CM

## 2020-07-12 DIAGNOSIS — I10 Essential (primary) hypertension: Secondary | ICD-10-CM | POA: Diagnosis not present

## 2020-07-12 DIAGNOSIS — R42 Dizziness and giddiness: Secondary | ICD-10-CM

## 2020-07-12 MED ORDER — AMLODIPINE BESYLATE 5 MG PO TABS: 5.0000 mg | ORAL_TABLET | Freq: Every day | ORAL | 1 refills | Status: DC

## 2020-07-12 MED ORDER — LISINOPRIL 20 MG PO TABS: 20.0000 mg | ORAL_TABLET | Freq: Every day | ORAL | 1 refills | Status: DC

## 2020-07-12 MED ORDER — NAPROXEN 500 MG PO TABS: 500.0000 mg | ORAL_TABLET | Freq: Two times a day (BID) | ORAL | 2 refills | Status: DC

## 2020-07-12 MED ORDER — CYCLOBENZAPRINE HCL 10 MG PO TABS: 10.0000 mg | ORAL_TABLET | Freq: Three times a day (TID) | ORAL | 0 refills | Status: DC | PRN

## 2020-07-12 MED ORDER — MECLIZINE HCL 12.5 MG PO TABS: 12.5000 mg | ORAL_TABLET | Freq: Three times a day (TID) | ORAL | 0 refills | Status: DC | PRN

## 2020-07-12 NOTE — Progress Notes (Signed)
Date:  07/12/2020   Name:  Bruce Richardson   DOB:  09-Oct-1950   MRN:  [id number]   Chief Complaint: Follow-up (ER f/u from fall x1 month ago- pt is still dizzy, neck pain, shoulder pain ), Diabetes, and Hypertension  Hypertension This is a chronic problem. The problem is controlled (was elevated at the ED when he fell a few weeks ago/  He stopped lisinopril due to the dose being too high ??). Associated symptoms include neck pain. Pertinent negatives include no chest pain, headaches, palpitations or shortness of breath. Past treatments include ACE inhibitors and calcium channel blockers. The current treatment provides significant improvement. Compliance problems: stopped lisinopril because someone told him to.   Diabetes He presents for his follow-up diabetic visit. He has type 2 diabetes mellitus. His disease course has been stable. Hypoglycemia symptoms include dizziness (when lying on left side). Pertinent negatives for hypoglycemia include no headaches, nervousness/anxiousness, seizures, speech difficulty or tremors. Pertinent negatives for diabetes include no chest pain, no fatigue, no polydipsia, no polyuria and no weakness. An ACE inhibitor/angiotensin II receptor blocker is not being taken.  Neck Pain  This is a new problem. The current episode started 1 to 4 weeks ago. The pain is associated with a fall. The pain is present in the left side, midline and right side. The quality of the pain is described as burning and aching. The pain is moderate. The symptoms are aggravated by position, twisting and bending. The pain is same all the time. Pertinent negatives include no chest pain, headaches, numbness, pain with swallowing, photophobia, tingling, trouble swallowing or weakness. He has tried heat for the symptoms.   IMPRESSION: CT of the head: Chronic atrophic and ischemic changes without acute abnormality. CT of the cervical spine: Degenerative change without acute abnormality.  Lab  Results  Component Value Date   CREATININE 1.10 06/26/2020   BUN 17 06/26/2020   NA 138 06/26/2020   K 3.9 06/26/2020   CL 99 06/26/2020   CO2 27 06/26/2020   Lab Results  Component Value Date   CHOL 139 04/03/2020   HDL 34 (L) 04/03/2020   LDLCALC 84 04/03/2020   TRIG 104 04/03/2020   CHOLHDL 4.1 04/03/2020   Lab Results  Component Value Date   TSH 2.879 04/03/2020   Lab Results  Component Value Date   HGBA1C 7.7 (H) 04/03/2020   Lab Results  Component Value Date   WBC 7.4 06/26/2020   HGB 15.6 06/26/2020   HCT 47.1 06/26/2020   MCV 86.9 06/26/2020   PLT 244 06/26/2020   Lab Results  Component Value Date   ALT 17 04/03/2020   AST 17 04/03/2020   ALKPHOS 53 04/03/2020   BILITOT 1.1 04/03/2020     Review of Systems  Constitutional: Negative for appetite change, diaphoresis, fatigue and unexpected weight change.  HENT: Negative for trouble swallowing.   Eyes: Negative for photophobia and visual disturbance.  Respiratory: Negative for cough, shortness of breath and wheezing.   Cardiovascular: Negative for chest pain, palpitations and leg swelling.  Gastrointestinal: Negative for abdominal pain, blood in stool, diarrhea and vomiting.  Endocrine: Negative for polydipsia and polyuria.  Genitourinary: Negative for dysuria and hematuria.  Musculoskeletal: Positive for myalgias, neck pain and neck stiffness.  Skin: Negative for color change and rash.  Neurological: Positive for dizziness (when lying on left side). Negative for tingling, tremors, seizures, speech difficulty, weakness, numbness and headaches.  Psychiatric/Behavioral: Positive for sleep disturbance. Negative for dysphoric mood.  The patient is not nervous/anxious.     Patient Active Problem List   Diagnosis Date Noted  . Chronic diastolic congestive heart failure ([place]) 11/29/2019  . Centrilobular emphysema ([place]) 01/26/2019  . Aortic atherosclerosis ([place]) 01/26/2019  . Echocardiogram shows left  ventricular diastolic dysfunction 96/11/5407  . Moderate episode of recurrent major depressive disorder ([place]) 02/16/2018  . Erectile dysfunction 02/16/2018  . History of CVA (cerebrovascular accident) without residual deficits 08/13/2017  . BPH without obstruction/lower urinary tract symptoms 03/19/2017  . Chronic bilateral low back pain 11/14/2016  . 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 GERD (gastroesophageal reflux disease) 04/11/2016  . Obesity 04/11/2016  . Allergic rhinitis 08/04/2015  . Essential hypertension 07/04/2015  . Hyperlipidemia due to type 2 diabetes mellitus ([place]) 07/04/2015  . Type II diabetes mellitus with complication ([place]) 81/19/1478  . Tendinitis of elbow or forearm 04/26/2015  . History of repair of rotator cuff 08/31/2014    No Known Allergies  Past Surgical History:  Procedure Laterality Date  . CIRCUMCISION N/A 07/24/2015   Procedure: CIRCUMCISION ADULT;  Surgeon: Nickie Retort, MD;  Location: [facility] ORS;  Service: Urology;  Laterality: N/A;  . EYE SURGERY Right    right eye cataract surgery   . SHOULDER ARTHROSCOPY Bilateral   . SHOULDER ARTHROSCOPY Right 12/22/2014   Procedure: RIGHT SHOULDER ARTHROSCOPY /DECOMPRESSION/ROTATOR CUFF REPAIR OF RECURRENT ROTATOR CUFF TEAR;  Surgeon: Corky Mull, MD;  Location: [facility] ORS;  Service: Orthopedics;  Laterality: Right;    Social History   Tobacco Use  . Smoking status: Former Smoker    Packs/day: 1.50    Years: 25.00    Pack years: 37.50    Types: Cigarettes    Start date: 12/19/2001    Quit date: 07/03/2002    Years since quitting: 18.0  . Smokeless tobacco: Former Systems developer  . Tobacco comment: quit smoking in 06/2001  Vaping Use  . Vaping Use: Never used  Substance Use Topics  . Alcohol use: No    Alcohol/week: 0.0 standard drinks  . Drug use: No     Medication list has been reviewed and updated.  Current Meds  Medication Sig  . albuterol (VENTOLIN HFA) 108 (90 Base)  MCG/ACT inhaler Inhale 2 puffs into the lungs every 6 (six) hours as needed for wheezing or shortness of breath.  . Ascorbic Acid (VITAMIN C) 1000 MG tablet Take 1,000 mg by mouth daily.  Marland Kitchen aspirin EC 81 MG tablet Take 1 tablet (81 mg total) by mouth daily.  . Blood Glucose Monitoring Suppl (ONE TOUCH ULTRA MINI) w/Device KIT 1 kit by Does not apply route as directed.  . fluticasone (FLONASE) 50 MCG/ACT nasal spray USE 1 SPRAY IN BOTH  NOSTRILS DAILY  . glucose blood test strip Check blood sugar twice daily. Dx.E11.9  . metFORMIN (GLUCOPHAGE) 1000 MG tablet TAKE 1 TABLET BY MOUTH  TWICE DAILY WITH A MEAL  . naproxen (NAPROSYN) 500 MG tablet Take 1 tablet (500 mg total) by mouth 2 (two) times daily with a meal.  . Omega 3-6-9 Fatty Acids (OMEGA-3-6-9 PO) Take by mouth. 1600 mg  . ONETOUCH DELICA LANCETS 29F MISC 1 each by Does not apply route 2 (two) times daily. Dx: E11.9  . pantoprazole (PROTONIX) 40 MG tablet TAKE 1 TABLET BY MOUTH  DAILY  . rosuvastatin (CRESTOR) 40 MG tablet TAKE 1 TABLET BY MOUTH  DAILY  . tamsulosin (FLOMAX) 0.4 MG CAPS capsule TAKE 1 CAPSULE BY MOUTH  DAILY FOR PROSTATE  .  vitamin B-12 (CYANOCOBALAMIN) 1000 MCG tablet Take 1,000 mcg by mouth daily.  . [DISCONTINUED] baclofen (LIORESAL) 10 MG tablet TAKE 1 TABLET BY MOUTH 3  TIMES DAILY (Patient taking differently: as needed. )  . [DISCONTINUED] naproxen (NAPROSYN) 500 MG tablet Take 1 tablet (500 mg total) by mouth 2 (two) times daily with a meal. For 2-4 weeks then as needed    PHQ 2/9 Scores 07/12/2020 04/03/2020 11/29/2019 10/13/2019  PHQ - 2 Score 0 $Remov'2 6 6  'wsoMPW$ PHQ- 9 Score 0 $Remov'4 8 12  'ibijdT$ Exception Documentation - - - -    GAD 7 : Generalized Anxiety Score 07/12/2020 04/03/2020 11/29/2019 08/24/2018  Nervous, Anxious, on Edge 0 0 0 (No Data)  Control/stop worrying 0 1 0 -  Worry too much - different things 0 1 0 -  Trouble relaxing 0 2 0 -  Restless 0 0 0 -  Easily annoyed or irritable 0 0 0 -  Afraid - awful might happen 0  0 0 -  Total GAD 7 Score 0 4 0 -  Anxiety Difficulty - Not difficult at all Not difficult at all -    BP Readings from Last 3 Encounters:  07/12/20 (!) 144/78  06/26/20 (!) 158/93  04/03/20 132/76    Physical Exam Vitals and nursing note reviewed.  Constitutional:      General: He is not in acute distress.    Appearance: Normal appearance. He is well-developed.  HENT:     Head: Normocephalic and atraumatic.     Right Ear: Hearing, tympanic membrane and ear canal normal.     Left Ear: Hearing, tympanic membrane and ear canal normal.  Eyes:     General: Lids are normal.     Extraocular Movements: Extraocular movements intact.     Conjunctiva/sclera: Conjunctivae normal.  Cardiovascular:     Rate and Rhythm: Normal rate and regular rhythm.     Pulses: Normal pulses.     Heart sounds: Normal heart sounds. No murmur heard.   Pulmonary:     Effort: Pulmonary effort is normal. No respiratory distress.     Breath sounds: No wheezing or rhonchi.  Musculoskeletal:     Right shoulder: No swelling or deformity. Normal range of motion.     Left shoulder: No swelling or deformity. Normal range of motion.     Cervical back: Spasms and tenderness present. No bony tenderness. Pain with movement present. Decreased range of motion.  Skin:    General: Skin is warm and dry.     Findings: No rash.  Neurological:     General: No focal deficit present.     Mental Status: He is alert and oriented to person, place, and time.     Sensory: Sensation is intact.     Motor: Motor function is intact. No weakness, tremor or atrophy.     Gait: Gait is intact.     Deep Tendon Reflexes:     Reflex Scores:      Bicep reflexes are 2+ on the right side and 2+ on the left side. Psychiatric:        Attention and Perception: Attention normal.        Mood and Affect: Affect is angry.     Wt Readings from Last 3 Encounters:  07/12/20 225 lb (102.1 kg)  06/26/20 223 lb (101.2 kg)  04/03/20 231 lb (104.8  kg)    BP (!) 144/78   Pulse 90   Temp 98.1 F (36.7 C) (Oral)  Ht '5\' 10"'$  (1.778 m)   Wt 225 lb (102.1 kg)   SpO2 95%   BMI 32.28 kg/m   Assessment and Plan: 1. Essential hypertension He has stopped lisinopril for unclear reasons then states that the dose is too high.  I recommend that he restart at a lower dose for BP control and renal protection and he agrees. Continue amlodipine 5 mg once a day. Monitor BP at home. - amLODipine (NORVASC) 5 MG tablet; Take 1 tablet (5 mg total) by mouth daily.  Dispense: 90 tablet; Refill: 1 - lisinopril (ZESTRIL) 20 MG tablet; Take 1 tablet (20 mg total) by mouth daily.  Dispense: 90 tablet; Refill: 1  2. Type II diabetes mellitus with complication (HCC) B7S has been stable on metformin alone. Resume ACEI as above. - Hemoglobin E8B - Basic metabolic panel  3. Degenerative disc disease, cervical CT neck and head were negative for fracture, hematoma etc. He may have sustained a mild concussion with the ongoing vertigo.  Discussed with patient avoiding bright lights, loud noises, TV or computers. Take Aleve twice every day along with Flexeril tid (pt warned about possible sedation) Continue to use heat or ice to posterior neck Follow up in several weeks - cyclobenzaprine (FLEXERIL) 10 MG tablet; Take 1 tablet (10 mg total) by mouth 3 (three) times daily as needed for muscle spasms.  Dispense: 60 tablet; Refill: 0 - naproxen (NAPROSYN) 500 MG tablet; Take 1 tablet (500 mg total) by mouth 2 (two) times daily with a meal.  Dispense: 60 tablet; Refill: 2  4. Vertigo Of unclear duration - pt is not sure if this just started since his fall His exam is benign and CT Brain normal. Will give meclizine to use as needed - sedation precautions given - meclizine (ANTIVERT) 12.5 MG tablet; Take 1 tablet (12.5 mg total) by mouth 3 (three) times daily as needed for dizziness.  Dispense: 30 tablet; Refill: 0      Partially dictated using Radio producer. Any errors are unintentional.  Halina Maidens, MD [facility]  07/12/2020

## 2020-07-13 LAB — BASIC METABOLIC PANEL
BUN/Creatinine Ratio: 15 (ref 10–24)
BUN: 18 mg/dL (ref 8–27)
CO2: 23 mmol/L (ref 20–29)
Calcium: 9.9 mg/dL (ref 8.6–10.2)
Chloride: 101 mmol/L (ref 96–106)
Creatinine, Ser: 1.19 mg/dL (ref 0.76–1.27)
GFR calc Af Amer: 71 mL/min/{1.73_m2} (ref >59)
GFR calc non Af Amer: 62 mL/min/{1.73_m2} (ref >59)
Glucose: 177 mg/dL — ABNORMAL HIGH (ref 65–99)
Potassium: 4.6 mmol/L (ref 3.5–5.2)
Sodium: 141 mmol/L (ref 134–144)

## 2020-07-13 LAB — HEMOGLOBIN A1C
Est. average glucose Bld gHb Est-mCnc: 180 mg/dL
Hgb A1c MFr Bld: 7.9 % — ABNORMAL HIGH (ref 4.8–5.6)

## 2020-07-17 ENCOUNTER — Other Ambulatory Visit: Payer: Self-pay | Admitting: Internal Medicine

## 2020-08-02 ENCOUNTER — Other Ambulatory Visit: Payer: Self-pay | Admitting: Internal Medicine

## 2020-08-07 ENCOUNTER — Other Ambulatory Visit: Payer: Self-pay

## 2020-08-07 ENCOUNTER — Encounter: Payer: Medicare Other | Admitting: Internal Medicine

## 2020-08-07 ENCOUNTER — Ambulatory Visit (INDEPENDENT_AMBULATORY_CARE_PROVIDER_SITE_OTHER): Payer: Medicare Other | Admitting: Internal Medicine

## 2020-08-07 ENCOUNTER — Encounter: Payer: Self-pay | Admitting: Internal Medicine

## 2020-08-07 VITALS — BP 124/70 | HR 101 | Ht 70.0 in | Wt 224.0 lb

## 2020-08-07 DIAGNOSIS — E118 Type 2 diabetes mellitus with unspecified complications: Secondary | ICD-10-CM | POA: Diagnosis not present

## 2020-08-07 DIAGNOSIS — M542 Cervicalgia: Secondary | ICD-10-CM

## 2020-08-07 DIAGNOSIS — I10 Essential (primary) hypertension: Secondary | ICD-10-CM | POA: Diagnosis not present

## 2020-08-07 MED ORDER — PREDNISONE 10 MG PO TABS: 10.0000 mg | ORAL_TABLET | ORAL | 0 refills | Status: AC

## 2020-08-07 NOTE — Progress Notes (Signed)
Date:  08/07/2020   Name:  Bruce Richardson   DOB:  10/26/1950   MRN:  [id number]   Chief Complaint: Hypertension  Hypertension This is a chronic problem. Associated symptoms include neck pain. Pertinent negatives include no chest pain, headaches, palpitations or shortness of breath. Past treatments include ACE inhibitors and calcium channel blockers (resumed lisinopril 20 mg last visit).  Diabetes He presents for his follow-up diabetic visit. He has type 2 diabetes mellitus. His disease course has been stable (last A1C 7.9). Pertinent negatives for hypoglycemia include no headaches. Pertinent negatives for diabetes include no chest pain, no fatigue, no weakness and no weight loss. Current diabetic treatment includes oral agent (monotherapy) (metformin).  Neck Pain  This is a new problem. The current episode started 1 to 4 weeks ago. The problem occurs constantly. The problem has been unchanged. The pain is associated with a fall. The pain is present in the right side. The quality of the pain is described as aching and burning. The pain is moderate. The symptoms are aggravated by coughing, twisting and position. The pain is same all the time. Pertinent negatives include no chest pain, fever, headaches, leg pain, trouble swallowing, weakness or weight loss. He has tried ice, heat, NSAIDs and muscle relaxants for the symptoms. The treatment provided no relief.    Lab Results  Component Value Date   CREATININE 1.19 07/12/2020   BUN 18 07/12/2020   NA 141 07/12/2020   K 4.6 07/12/2020   CL 101 07/12/2020   CO2 23 07/12/2020   Lab Results  Component Value Date   CHOL 139 04/03/2020   HDL 34 (L) 04/03/2020   LDLCALC 84 04/03/2020   TRIG 104 04/03/2020   CHOLHDL 4.1 04/03/2020   Lab Results  Component Value Date   TSH 2.879 04/03/2020   Lab Results  Component Value Date   HGBA1C 7.9 (H) 07/12/2020   Lab Results  Component Value Date   WBC 7.4 06/26/2020   HGB 15.6 06/26/2020    HCT 47.1 06/26/2020   MCV 86.9 06/26/2020   PLT 244 06/26/2020   Lab Results  Component Value Date   ALT 17 04/03/2020   AST 17 04/03/2020   ALKPHOS 53 04/03/2020   BILITOT 1.1 04/03/2020     Review of Systems  Constitutional: Negative for chills, fatigue, fever and weight loss.  HENT: Negative for trouble swallowing.   Respiratory: Negative for cough, chest tightness and shortness of breath.   Cardiovascular: Negative for chest pain, palpitations and leg swelling.  Musculoskeletal: Positive for neck pain.  Neurological: Negative for weakness and headaches.    Patient Active Problem List   Diagnosis Date Noted  . Chronic diastolic congestive heart failure ([place]) 11/29/2019  . Centrilobular emphysema ([place]) 01/26/2019  . Aortic atherosclerosis ([place]) 01/26/2019  . Echocardiogram shows left ventricular diastolic dysfunction 73/71/0626  . Moderate episode of recurrent major depressive disorder ([place]) 02/16/2018  . Erectile dysfunction 02/16/2018  . History of CVA (cerebrovascular accident) without residual deficits 08/13/2017  . BPH without obstruction/lower urinary tract symptoms 03/19/2017  . Chronic bilateral low back pain 11/14/2016  . 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 GERD (gastroesophageal reflux disease) 04/11/2016  . Obesity 04/11/2016  . Allergic rhinitis 08/04/2015  . Essential hypertension 07/04/2015  . Hyperlipidemia due to type 2 diabetes mellitus ([place]) 07/04/2015  . Type II diabetes mellitus with complication ([place]) 94/85/4627  . Tendinitis of elbow or forearm 04/26/2015  . History of repair of  rotator cuff 08/31/2014    No Known Allergies  Past Surgical History:  Procedure Laterality Date  . CIRCUMCISION N/A 07/24/2015   Procedure: CIRCUMCISION ADULT;  Surgeon: Nickie Retort, MD;  Location: [facility] ORS;  Service: Urology;  Laterality: N/A;  . EYE SURGERY Right    right eye cataract surgery   . SHOULDER ARTHROSCOPY Bilateral   .  SHOULDER ARTHROSCOPY Right 12/22/2014   Procedure: RIGHT SHOULDER ARTHROSCOPY /DECOMPRESSION/ROTATOR CUFF REPAIR OF RECURRENT ROTATOR CUFF TEAR;  Surgeon: Corky Mull, MD;  Location: [facility] ORS;  Service: Orthopedics;  Laterality: Right;    Social History   Tobacco Use  . Smoking status: Former Smoker    Packs/day: 1.50    Years: 25.00    Pack years: 37.50    Types: Cigarettes    Start date: 12/19/2001    Quit date: 07/03/2002    Years since quitting: 18.1  . Smokeless tobacco: Former Systems developer  . Tobacco comment: quit smoking in 06/2001  Vaping Use  . Vaping Use: Never used  Substance Use Topics  . Alcohol use: No    Alcohol/week: 0.0 standard drinks  . Drug use: No     Medication list has been reviewed and updated.  Current Meds  Medication Sig  . albuterol (VENTOLIN HFA) 108 (90 Base) MCG/ACT inhaler Inhale 2 puffs into the lungs every 6 (six) hours as needed for wheezing or shortness of breath.  Marland Kitchen amLODipine (NORVASC) 5 MG tablet Take 1 tablet (5 mg total) by mouth daily.  . Ascorbic Acid (VITAMIN C) 1000 MG tablet Take 1,000 mg by mouth daily.  Marland Kitchen aspirin EC 81 MG tablet Take 1 tablet (81 mg total) by mouth daily.  . Blood Glucose Monitoring Suppl (ONE TOUCH ULTRA MINI) w/Device KIT 1 kit by Does not apply route as directed.  . cyclobenzaprine (FLEXERIL) 10 MG tablet Take 1 tablet (10 mg total) by mouth 3 (three) times daily as needed for muscle spasms.  . fluticasone (FLONASE) 50 MCG/ACT nasal spray USE 1 SPRAY IN BOTH  NOSTRILS DAILY  . glucose blood test strip Check blood sugar twice daily. Dx.E11.9  . lisinopril (ZESTRIL) 20 MG tablet Take 1 tablet (20 mg total) by mouth daily.  . meclizine (ANTIVERT) 12.5 MG tablet Take 1 tablet (12.5 mg total) by mouth 3 (three) times daily as needed for dizziness.  . metFORMIN (GLUCOPHAGE) 1000 MG tablet TAKE 1 TABLET BY MOUTH  TWICE DAILY WITH A MEAL  . naproxen (NAPROSYN) 500 MG tablet Take 1 tablet (500 mg total) by mouth 2 (two) times  daily with a meal.  . Omega 3-6-9 Fatty Acids (OMEGA-3-6-9 PO) Take by mouth. 1600 mg  . ONETOUCH DELICA LANCETS 01U MISC 1 each by Does not apply route 2 (two) times daily. Dx: E11.9  . pantoprazole (PROTONIX) 40 MG tablet TAKE 1 TABLET BY MOUTH  DAILY  . rosuvastatin (CRESTOR) 40 MG tablet TAKE 1 TABLET BY MOUTH  DAILY  . tamsulosin (FLOMAX) 0.4 MG CAPS capsule TAKE 1 CAPSULE BY MOUTH  DAILY FOR PROSTATE  . vitamin B-12 (CYANOCOBALAMIN) 1000 MCG tablet Take 1,000 mcg by mouth daily.    PHQ 2/9 Scores 08/07/2020 07/12/2020 04/03/2020 11/29/2019  PHQ - 2 Score 0 0 2 6  PHQ- 9 Score 0 0 4 8  Exception Documentation - - - -    GAD 7 : Generalized Anxiety Score 08/07/2020 07/12/2020 04/03/2020 11/29/2019  Nervous, Anxious, on Edge 0 0 0 0  Control/stop worrying 0 0 1 0  Worry  too much - different things 0 0 1 0  Trouble relaxing 0 0 2 0  Restless 0 0 0 0  Easily annoyed or irritable 0 0 0 0  Afraid - awful might happen 0 0 0 0  Total GAD 7 Score 0 0 4 0  Anxiety Difficulty Not difficult at all - Not difficult at all Not difficult at all    BP Readings from Last 3 Encounters:  08/07/20 124/70  07/12/20 (!) 144/78  06/26/20 (!) 158/93    Physical Exam Vitals and nursing note reviewed.  Constitutional:      General: He is not in acute distress.    Appearance: Normal appearance. He is well-developed.  HENT:     Head: Normocephalic and atraumatic.  Cardiovascular:     Rate and Rhythm: Normal rate and regular rhythm.     Pulses: Normal pulses.  Pulmonary:     Effort: Pulmonary effort is normal. No respiratory distress.     Breath sounds: No wheezing or rhonchi.  Musculoskeletal:     Cervical back: Rigidity and tenderness present. Pain with movement present. Decreased range of motion.  Skin:    General: Skin is warm and dry.     Findings: No rash.  Neurological:     Mental Status: He is alert and oriented to person, place, and time.  Psychiatric:        Mood and Affect: Mood  and affect normal.        Behavior: Behavior normal.        Thought Content: Thought content normal.     Wt Readings from Last 3 Encounters:  08/07/20 224 lb (101.6 kg)  07/12/20 225 lb (102.1 kg)  06/26/20 223 lb (101.2 kg)    BP 124/70   Pulse (!) 101   Ht $R'5\' 10"'tj$  (1.778 m)   Wt 224 lb (101.6 kg)   SpO2 95%   BMI 32.14 kg/m   Assessment and Plan: 1. Essential hypertension Clinically stable exam with well controlled BP after resuming lisinopril. Tolerating medications without side effects at this time. Pt to continue current regimen and low sodium diet; benefits of regular exercise as able discussed.  2. Acute neck pain Not improved with nsaids, flexeril, ice/heat Pt declines PT referral Will try prednisone taper; refer - predniSONE (DELTASONE) 10 MG tablet; Take 1 tablet (10 mg total) by mouth as directed for 6 days. Take 6,5,4,3,2,1 then stop  Dispense: 21 tablet; Refill: 0 - Ambulatory referral to Neurosurgery  3. Type II diabetes mellitus with complication (HCC) Clinically stable by exam and report without s/s of hypoglycemia. DM complicated by HTN. Tolerating medications well without side effects or other concerns.   Partially dictated using Editor, commissioning. Any errors are unintentional.  Halina Maidens, MD [facility]  08/07/2020

## 2020-08-29 DIAGNOSIS — Z8673 Personal history of transient ischemic attack (TIA), and cerebral infarction without residual deficits: Secondary | ICD-10-CM | POA: Diagnosis not present

## 2020-08-29 DIAGNOSIS — R42 Dizziness and giddiness: Secondary | ICD-10-CM | POA: Diagnosis not present

## 2020-08-29 DIAGNOSIS — M5412 Radiculopathy, cervical region: Secondary | ICD-10-CM | POA: Diagnosis not present

## 2020-08-30 DIAGNOSIS — M542 Cervicalgia: Secondary | ICD-10-CM | POA: Diagnosis not present

## 2020-09-13 ENCOUNTER — Other Ambulatory Visit: Payer: Self-pay | Admitting: Internal Medicine

## 2020-09-13 DIAGNOSIS — E1169 Type 2 diabetes mellitus with other specified complication: Secondary | ICD-10-CM

## 2020-09-21 ENCOUNTER — Other Ambulatory Visit: Payer: Self-pay | Admitting: Internal Medicine

## 2020-09-24 ENCOUNTER — Other Ambulatory Visit: Payer: Self-pay | Admitting: Internal Medicine

## 2020-09-24 DIAGNOSIS — J3089 Other allergic rhinitis: Secondary | ICD-10-CM

## 2020-09-26 ENCOUNTER — Other Ambulatory Visit: Payer: Self-pay

## 2020-09-26 MED ORDER — ALBUTEROL SULFATE HFA 108 (90 BASE) MCG/ACT IN AERS: 2.0000 | INHALATION_SPRAY | Freq: Four times a day (QID) | RESPIRATORY_TRACT | 3 refills | Status: DC | PRN

## 2020-10-18 ENCOUNTER — Ambulatory Visit (INDEPENDENT_AMBULATORY_CARE_PROVIDER_SITE_OTHER): Payer: Medicare Other

## 2020-10-18 DIAGNOSIS — Z0000 Encounter for general adult medical examination without abnormal findings: Secondary | ICD-10-CM | POA: Diagnosis not present

## 2020-10-18 NOTE — Patient Instructions (Signed)
Bruce Richardson , Thank you for taking time to come for your Medicare Wellness Visit. I appreciate your ongoing commitment to your health goals. Please review the following plan we discussed and let me know if I can assist you in the future.   Screening recommendations/referrals: Colonoscopy: Cologuard done 04/11/20. Repeat in 2024.  Recommended yearly ophthalmology/optometry visit for glaucoma screening and checkup Recommended yearly dental visit for hygiene and checkup  Vaccinations: Influenza vaccine: declined Pneumococcal vaccine: declined Tdap vaccine: due Shingles vaccine: Shingrix discussed. Please contact your pharmacy for coverage information.  Covid-19: done 04/25/20 & 05/23/20  Advanced directives: Advance directive discussed with you today. Even though you declined this today please call our office should you change your mind and we can give you the proper paperwork for you to fill out.  Conditions/risks identified: Recommend increasing physical activity as tolerated.   Next appointment: Follow up in one year for your annual wellness visit.   Preventive Care 46 Years and Older, Male Preventive care refers to lifestyle choices and visits with your health care provider that can promote health and wellness. What does preventive care include?  A yearly physical exam. This is also called an annual well check.  Dental exams once or twice a year.  Routine eye exams. Ask your health care provider how often you should have your eyes checked.  Personal lifestyle choices, including:  Daily care of your teeth and gums.  Regular physical activity.  Eating a healthy diet.  Avoiding tobacco and drug use.  Limiting alcohol use.  Practicing safe sex.  Taking low doses of aspirin every day.  Taking vitamin and mineral supplements as recommended by your health care provider. What happens during an annual well check? The services and screenings done by your health care provider during  your annual well check will depend on your age, overall health, lifestyle risk factors, and family history of disease. Counseling  Your health care provider may ask you questions about your:  Alcohol use.  Tobacco use.  Drug use.  Emotional well-being.  Home and relationship well-being.  Sexual activity.  Eating habits.  History of falls.  Memory and ability to understand (cognition).  Work and work Astronomer. Screening  You may have the following tests or measurements:  Height, weight, and BMI.  Blood pressure.  Lipid and cholesterol levels. These may be checked every 5 years, or more frequently if you are over 1 years old.  Skin check.  Lung cancer screening. You may have this screening every year starting at age 44 if you have a 30-pack-year history of smoking and currently smoke or have quit within the past 15 years.  Fecal occult blood test (FOBT) of the stool. You may have this test every year starting at age 71.  Flexible sigmoidoscopy or colonoscopy. You may have a sigmoidoscopy every 5 years or a colonoscopy every 10 years starting at age 29.  Prostate cancer screening. Recommendations will vary depending on your family history and other risks.  Hepatitis C blood test.  Hepatitis B blood test.  Sexually transmitted disease (STD) testing.  Diabetes screening. This is done by checking your blood sugar (glucose) after you have not eaten for a while (fasting). You may have this done every 1-3 years.  Abdominal aortic aneurysm (AAA) screening. You may need this if you are a current or former smoker.  Osteoporosis. You may be screened starting at age 25 if you are at high risk. Talk with your health care provider about your test  results, treatment options, and if necessary, the need for more tests. Vaccines  Your health care provider may recommend certain vaccines, such as:  Influenza vaccine. This is recommended every year.  Tetanus, diphtheria, and  acellular pertussis (Tdap, Td) vaccine. You may need a Td booster every 10 years.  Zoster vaccine. You may need this after age 63.  Pneumococcal 13-valent conjugate (PCV13) vaccine. One dose is recommended after age 41.  Pneumococcal polysaccharide (PPSV23) vaccine. One dose is recommended after age 56. Talk to your health care provider about which screenings and vaccines you need and how often you need them. This information is not intended to replace advice given to you by your health care provider. Make sure you discuss any questions you have with your health care provider. Document Released: 09/01/2015 Document Revised: 04/24/2016 Document Reviewed: 06/06/2015 Elsevier Interactive Patient Education  2017 [employer] Prevention in the Home Falls can cause injuries. They can happen to people of all ages. There are many things you can do to make your home safe and to help prevent falls. What can I do on the outside of my home?  Regularly fix the edges of walkways and driveways and fix any cracks.  Remove anything that might make you trip as you walk through a door, such as a raised step or threshold.  Trim any bushes or trees on the path to your home.  Use bright outdoor lighting.  Clear any walking paths of anything that might make someone trip, such as rocks or tools.  Regularly check to see if handrails are loose or broken. Make sure that both sides of any steps have handrails.  Any raised decks and porches should have guardrails on the edges.  Have any leaves, snow, or ice cleared regularly.  Use sand or salt on walking paths during winter.  Clean up any spills in your garage right away. This includes oil or grease spills. What can I do in the bathroom?  Use night lights.  Install grab bars by the toilet and in the tub and shower. Do not use towel bars as grab bars.  Use non-skid mats or decals in the tub or shower.  If you need to sit down in the shower, use  a plastic, non-slip stool.  Keep the floor dry. Clean up any water that spills on the floor as soon as it happens.  Remove soap buildup in the tub or shower regularly.  Attach bath mats securely with double-sided non-slip rug tape.  Do not have throw rugs and other things on the floor that can make you trip. What can I do in the bedroom?  Use night lights.  Make sure that you have a light by your bed that is easy to reach.  Do not use any sheets or blankets that are too big for your bed. They should not hang down onto the floor.  Have a firm chair that has side arms. You can use this for support while you get dressed.  Do not have throw rugs and other things on the floor that can make you trip. What can I do in the kitchen?  Clean up any spills right away.  Avoid walking on wet floors.  Keep items that you use a lot in easy-to-reach places.  If you need to reach something above you, use a strong step stool that has a grab bar.  Keep electrical cords out of the way.  Do not use floor polish or wax that makes  floors slippery. If you must use wax, use non-skid floor wax.  Do not have throw rugs and other things on the floor that can make you trip. What can I do with my stairs?  Do not leave any items on the stairs.  Make sure that there are handrails on both sides of the stairs and use them. Fix handrails that are broken or loose. Make sure that handrails are as long as the stairways.  Check any carpeting to make sure that it is firmly attached to the stairs. Fix any carpet that is loose or worn.  Avoid having throw rugs at the top or bottom of the stairs. If you do have throw rugs, attach them to the floor with carpet tape.  Make sure that you have a light switch at the top of the stairs and the bottom of the stairs. If you do not have them, ask someone to add them for you. What else can I do to help prevent falls?  Wear shoes that:  Do not have high heels.  Have  rubber bottoms.  Are comfortable and fit you well.  Are closed at the toe. Do not wear sandals.  If you use a stepladder:  Make sure that it is fully opened. Do not climb a closed stepladder.  Make sure that both sides of the stepladder are locked into place.  Ask someone to hold it for you, if possible.  Clearly mark and make sure that you can see:  Any grab bars or handrails.  First and last steps.  Where the edge of each step is.  Use tools that help you move around (mobility aids) if they are needed. These include:  Canes.  Walkers.  Scooters.  Crutches.  Turn on the lights when you go into a dark area. Replace any light bulbs as soon as they burn out.  Set up your furniture so you have a clear path. Avoid moving your furniture around.  If any of your floors are uneven, fix them.  If there are any pets around you, be aware of where they are.  Review your medicines with your doctor. Some medicines can make you feel dizzy. This can increase your chance of falling. Ask your doctor what other things that you can do to help prevent falls. This information is not intended to replace advice given to you by your health care provider. Make sure you discuss any questions you have with your health care provider. Document Released: 06/01/2009 Document Revised: 01/11/2016 Document Reviewed: 09/09/2014 Elsevier Interactive Patient Education  2017 [employer].

## 2020-10-18 NOTE — Progress Notes (Signed)
Subjective:   Bruce Richardson is a 70 y.o. male who presents for Medicare Annual/Subsequent preventive examination.  Virtual Visit via Telephone Note  I connected with  Bruce Richardson on 10/18/20 at  8:00 AM EST by telephone and verified that I am speaking with the correct person using two identifiers.  Location: Patient: home Provider: [facility] Persons participating in the virtual visit: [place]   I discussed the limitations, risks, security and privacy concerns of performing an evaluation and management service by telephone and the availability of in person appointments. The patient expressed understanding and agreed to proceed.  Interactive audio and video telecommunications were attempted between this nurse and patient, however failed, due to patient having technical difficulties OR patient did not have access to video capability.  We continued and completed visit with audio only.  Some vital signs may be absent or patient reported.   Clemetine Marker, LPN    Review of Systems     Cardiac Risk Factors include: advanced age (>61men, >32 women);diabetes mellitus;dyslipidemia;hypertension;obesity (BMI >30kg/m2)     Objective:    There were no vitals filed for this visit. There is no height or weight on file to calculate BMI.  Advanced Directives 10/18/2020 06/26/2020 10/13/2019 03/17/2019 03/16/2019 09/02/2018 02/10/2018  Does Patient Have a Medical Advance Directive? No No No No No No Yes  Type of Advance Directive - - - - - - Living will;Healthcare Power of Attorney  Does patient want to make changes to medical advance directive? - - - - - - -  Copy of [place] in Chart? - - - - - - No - copy requested  Would patient like information on creating a medical advance directive? No - Patient declined - Yes (MAU/Ambulatory/Procedural Areas - Information given) No - Patient declined No - Patient declined No - Patient declined -  Some encounter information is  confidential and restricted. Go to Review Flowsheets activity to see all data.    Current Medications (verified) Outpatient Encounter Medications as of 10/18/2020  Medication Sig  . albuterol (VENTOLIN HFA) 108 (90 Base) MCG/ACT inhaler Inhale 2 puffs into the lungs every 6 (six) hours as needed for wheezing or shortness of breath.  Marland Kitchen amLODipine (NORVASC) 5 MG tablet Take 1 tablet (5 mg total) by mouth daily.  . Ascorbic Acid (VITAMIN C) 1000 MG tablet Take 1,000 mg by mouth daily.  Marland Kitchen aspirin EC 81 MG tablet Take 1 tablet (81 mg total) by mouth daily.  . Blood Glucose Monitoring Suppl (ONE TOUCH ULTRA MINI) w/Device KIT 1 kit by Does not apply route as directed.  . cyclobenzaprine (FLEXERIL) 10 MG tablet Take 1 tablet (10 mg total) by mouth 3 (three) times daily as needed for muscle spasms.  . fluticasone (FLONASE) 50 MCG/ACT nasal spray USE 1 SPRAY IN BOTH  NOSTRILS DAILY  . glucose blood test strip Check blood sugar twice daily. Dx.E11.9  . lisinopril (ZESTRIL) 40 MG tablet TAKE 1 TABLET BY MOUTH  DAILY  . metFORMIN (GLUCOPHAGE) 1000 MG tablet TAKE 1 TABLET BY MOUTH  TWICE DAILY WITH A MEAL  . naproxen (NAPROSYN) 500 MG tablet Take 1 tablet (500 mg total) by mouth 2 (two) times daily with a meal.  . ONETOUCH DELICA LANCETS 41D MISC 1 each by Does not apply route 2 (two) times daily. Dx: E11.9  . pantoprazole (PROTONIX) 40 MG tablet TAKE 1 TABLET BY MOUTH  DAILY  . rosuvastatin (CRESTOR) 40 MG tablet TAKE 1 TABLET  BY MOUTH  DAILY  . tamsulosin (FLOMAX) 0.4 MG CAPS capsule TAKE 1 CAPSULE BY MOUTH  DAILY FOR PROSTATE  . vitamin B-12 (CYANOCOBALAMIN) 1000 MCG tablet Take 1,000 mcg by mouth daily.  . Omega 3-6-9 Fatty Acids (OMEGA-3-6-9 PO) Take by mouth. 1600 mg (Patient not taking: Reported on 10/18/2020)  . [DISCONTINUED] lisinopril (ZESTRIL) 20 MG tablet Take 1 tablet (20 mg total) by mouth daily.   No facility-administered encounter medications on file as of 10/18/2020.    Allergies  (verified) Patient has no known allergies.   History: Past Medical History:  Diagnosis Date  . Arthritis   . Bulging lumbar disc    lower back  . Complete rotator cuff rupture of left shoulder 09/23/2014  . Complication of anesthesia    kept moving while under anesthesia during colonoscopy, hard to put under.  . Diabetes mellitus without complication (HCC)   . GERD (gastroesophageal reflux disease)   . Hyperlipidemia   . Hypertension   . Injury of tendon of upper extremity 12/23/2014  . Non-retracting foreskin 07/04/2015  . Stroke [facility])    Past Surgical History:  Procedure Laterality Date  . CIRCUMCISION N/A 07/24/2015   Procedure: CIRCUMCISION ADULT;  Surgeon: Hildred Laser, MD;  Location: [facility] ORS;  Service: Urology;  Laterality: N/A;  . EYE SURGERY Right    right eye cataract surgery   . SHOULDER ARTHROSCOPY Bilateral   . SHOULDER ARTHROSCOPY Right 12/22/2014   Procedure: RIGHT SHOULDER ARTHROSCOPY /DECOMPRESSION/ROTATOR CUFF REPAIR OF RECURRENT ROTATOR CUFF TEAR;  Surgeon: Christena Flake, MD;  Location: [facility] ORS;  Service: Orthopedics;  Laterality: Right;   Family History  Problem Relation Age of Onset  . Alzheimer's disease Mother   . Diabetes Mother   . Hypertension Mother   . Cancer Father   . Heart failure Brother   . Diabetes Brother   . Hypertension Brother   . Heart disease Brother   . Prostate cancer Other        unknown  . Bladder Cancer Other        unknown  . Heart disease Son   . Diabetes Sister    Social History   Socioeconomic History  . Marital status: Married    Spouse name: Not on file  . Number of children: Not on file  . Years of education: Not on file  . Highest education level: Not on file  Occupational History  . Occupation: retired  Tobacco Use  . Smoking status: Former Smoker    Packs/day: 1.50    Years: 25.00    Pack years: 37.50    Types: Cigarettes    Start date: 12/19/2001    Quit date: 07/03/2002    Years since quitting: 18.3   . Smokeless tobacco: Former Neurosurgeon  . Tobacco comment: quit smoking in 06/2001  Vaping Use  . Vaping Use: Never used  Substance and Sexual Activity  . Alcohol use: No    Alcohol/week: 0.0 standard drinks  . Drug use: No  . Sexual activity: Yes    Partners: Female  Other Topics Concern  . Not on file  Social History Narrative  . Not on file   Social Determinants of Health   Financial Resource Strain: Low Risk   . Difficulty of Paying Living Expenses: Not very hard  Food Insecurity: No Food Insecurity  . Worried About Programme researcher, broadcasting/film/video in the Last Year: Never true  . Ran Out of Food in the Last Year: Never true  Transportation  Needs: No Transportation Needs  . Lack of Transportation (Medical): No  . Lack of Transportation (Non-Medical): No  Physical Activity: Inactive  . Days of Exercise per Week: 0 days  . Minutes of Exercise per Session: 0 min  Stress: No Stress Concern Present  . Feeling of Stress : Only a little  Social Connections: Moderately Isolated  . Frequency of Communication with Friends and Family: More than three times a week  . Frequency of Social Gatherings with Friends and Family: Three times a week  . Attends Religious Services: Never  . Active Member of Clubs or Organizations: No  . Attends Archivist Meetings: Never  . Marital Status: Married    Tobacco Counseling Counseling given: Not Answered Comment: quit smoking in 06/2001   Clinical Intake:  Pre-visit preparation completed: Yes  Pain : No/denies pain     Nutritional Risks: None Diabetes: Yes CBG done?: No Did pt. bring in CBG monitor from home?: No  How often do you need to have someone help you when you read instructions, pamphlets, or other written materials from your doctor or pharmacy?: 1 - Never  Nutrition Risk Assessment:  Has the patient had any N/V/D within the last 2 months?  No  Does the patient have any non-healing wounds?  No  Has the patient had any  unintentional weight loss or weight gain?  No   Diabetes:  Is the patient diabetic?  Yes  If diabetic, was a CBG obtained today?  No  Did the patient bring in their glucometer from home?  No  How often do you monitor your CBG's? Once a week per patient.   Financial Strains and Diabetes Management:  Are you having any financial strains with the device, your supplies or your medication? No .  Does the patient want to be seen by Chronic Care Management for management of their diabetes?  No  Would the patient like to be referred to a Nutritionist or for Diabetic Management?  No   Diabetic Exams:  Diabetic Eye Exam: Completed per patient April 21; will request records from Dr. Ellin Mayhew.  Diabetic Foot Exam: Completed 04/03/20.   Interpreter Needed?: No  Information entered by :: Clemetine Marker LPN   Activities of Daily Living In your present state of health, do you have any difficulty performing the following activities: 10/18/2020  Hearing? Y  Comment declines hearing aids  Vision? N  Difficulty concentrating or making decisions? Y  Walking or climbing stairs? Y  Dressing or bathing? N  Doing errands, shopping? N  Preparing Food and eating ? N  Using the Toilet? N  In the past six months, have you accidently leaked urine? N  Do you have problems with loss of bowel control? N  Managing your Medications? N  Managing your Finances? N  Housekeeping or managing your Housekeeping? N  Some recent data might be hidden    Patient Care Team: Glean Hess, MD as PCP - General (Internal Medicine) Anell Barr, OD (Optometry)  Indicate any recent Medical Services you may have received from other than [facility] providers in the past year (date may be approximate).     Assessment:   This is a routine wellness examination for [place].  Hearing/Vision screen  Hearing Screening   '125Hz'$  $Remo'250Hz'xttRO$'500Hz'$'1000Hz'$'2000Hz'$'3000Hz'$'4000Hz'$'6000Hz'$'8000Hz'$   Right ear:           Left ear:            Comments: Pt c/o  occasional hearing difficulty; declines hearing aids   Vision Screening Comments: Annual vision screenings with Dr. Ellin Mayhew  Dietary issues and exercise activities discussed: Current Exercise Habits: The patient does not participate in regular exercise at present, Exercise limited by: Other - see comments (feels dizzy)  Goals    . DIET - INCREASE WATER INTAKE     Recommend continue drinking at least 6-8 glasses of water a day     . Exercise     Continue walking.       Depression Screen PHQ 2/9 Scores 10/18/2020 08/07/2020 07/12/2020 04/03/2020 11/29/2019 10/13/2019 07/28/2019  PHQ - 2 Score 3 0 0 $R'2 6 6 6  'Se$ PHQ- 9 Score 9 0 0 $R'4 8 12 10  'MM$ Exception Documentation - - - - - - -    Fall Risk Fall Risk  10/18/2020 08/07/2020 07/12/2020 11/29/2019 10/13/2019  Falls in the past year? $RemoveBe'1 1 1 'qaYrjJnoN$ 0 0  Number falls in past yr: 0 1 0 0 0  Injury with Fall? 0 0 - 0 0  Risk for fall due to : Impaired balance/gait - - No Fall Risks No Fall Risks  Follow up Falls prevention discussed Falls evaluation completed Falls evaluation completed Falls evaluation completed Falls prevention discussed    FALL RISK PREVENTION PERTAINING TO THE HOME:  Any stairs in or around the home? Yes  If so, are there any without handrails? No  Home free of loose throw rugs in walkways, pet beds, electrical cords, etc? Yes  Adequate lighting in your home to reduce risk of falls? Yes   ASSISTIVE DEVICES UTILIZED TO PREVENT FALLS:  Life alert? No  Use of a cane, walker or w/c? Yes  Grab bars in the bathroom? Yes  Shower chair or bench in shower? No  Elevated toilet seat or a handicapped toilet? Yes   TIMED UP AND GO:  Was the test performed? No . Telephonic visit.   Cognitive Function: pt declined 6CIT for 2022 AWV. Normal cognitive status assessed by direct observation by this Nurse Health Advisor. No abnormalities found.     MMSE - Mini Mental State Exam 04/12/2015  Orientation to time 5  Orientation  to Place 5  Registration 3  Attention/ Calculation 5  Recall 3  Language- name 2 objects 2  Language- repeat 1  Language- follow 3 step command 3  Language- read & follow direction 0  Write a sentence 0  Copy design 0  Total score 27     6CIT Screen 10/13/2019 02/10/2018 02/04/2017  What Year? 0 points 0 points 0 points  What month? 0 points 0 points 0 points  What time? 0 points 0 points 0 points  Count back from 20 0 points 0 points 0 points  Months in reverse 0 points 0 points 0 points  Repeat phrase 0 points 0 points 0 points  Total Score 0 0 0    Immunizations Immunization History  Administered Date(s) Administered  . Moderna Sars-Covid-2 Vaccination 04/25/2020, 05/23/2020    TDAP status: Due, Education has been provided regarding the importance of this vaccine. Advised may receive this vaccine at local pharmacy or Health Dept. Aware to provide a copy of the vaccination record if obtained from local pharmacy or Health Dept. Verbalized acceptance and understanding.  Flu Vaccine status: Declined, Education has been provided regarding the importance of this vaccine but patient still declined. Advised may receive this vaccine at local pharmacy or Health Dept. Aware to provide a copy of the vaccination record if  obtained from local pharmacy or Health Dept. Verbalized acceptance and understanding.  Pneumococcal vaccine status: Declined,  Education has been provided regarding the importance of this vaccine but patient still declined. Advised may receive this vaccine at local pharmacy or Health Dept. Aware to provide a copy of the vaccination record if obtained from local pharmacy or Health Dept. Verbalized acceptance and understanding.   Covid-19 vaccine status: Completed vaccines  Qualifies for Shingles Vaccine? Yes   Zostavax completed No   Shingrix Completed?: No.    Education has been provided regarding the importance of this vaccine. Patient has been advised to call insurance  company to determine out of pocket expense if they have not yet received this vaccine. Advised may also receive vaccine at local pharmacy or Health Dept. Verbalized acceptance and understanding.  Screening Tests Health Maintenance  Topic Date Due  . OPHTHALMOLOGY EXAM  11/04/2019  . INFLUENZA VACCINE  11/16/2020 (Originally 03/19/2020)  . PNA vac Low Risk Adult (1 of 2 - PCV13) 04/03/2021 (Originally 02/16/2015)  . TETANUS/TDAP  08/07/2021 (Originally 02/15/1970)  . COVID-19 Vaccine (3 - Booster for Moderna series) 11/21/2020  . HEMOGLOBIN A1C  01/09/2021  . FOOT EXAM  04/03/2021  . Fecal DNA (Cologuard)  04/12/2023  . Hepatitis C Screening  Completed  . HPV VACCINES  Aged Out    Health Maintenance  Health Maintenance Due  Topic Date Due  . OPHTHALMOLOGY EXAM  11/04/2019    Colorectal cancer screening: Type of screening: Cologuard. Completed 04/11/20. Repeat every 3 years  Lung Cancer Screening: (Low Dose CT Chest recommended if Age 78-80 years, 30 pack-year currently smoking OR have quit w/in 15years.) does not qualify.  Additional Screening:  Hepatitis C Screening: does qualify; Completed 02/25/17  Vision Screening: Recommended annual ophthalmology exams for early detection of glaucoma and other disorders of the eye. Is the patient up to date with their annual eye exam?  Yes  Who is the provider or what is the name of the office in which the patient attends annual eye exams? Dr. Ellin Mayhew  Dental Screening: Recommended annual dental exams for proper oral hygiene  Community Resource Referral / Chronic Care Management: CRR required this visit?  No   CCM required this visit?  No      Plan:     I have personally reviewed and noted the following in the patient's chart:   . Medical and social history . Use of alcohol, tobacco or illicit drugs  . Current medications and supplements . Functional ability and status . Nutritional status . Physical activity . Advanced  directives . List of other physicians . Hospitalizations, surgeries, and ER visits in previous 12 months . Vitals . Screenings to include cognitive, depression, and falls . Referrals and appointments  In addition, I have reviewed and discussed with patient certain preventive protocols, quality metrics, and best practice recommendations. A written personalized care plan for preventive services as well as general preventive health recommendations were provided to patient.     Clemetine Marker, LPN   04/27/8337   Nurse Notes: pt c/o feeling dizzy when standing, sitting or lying down which has been ongoing since last fall. Pt also c/o neck pain since fall in Nov 21. Pt is scheduled to follow up with [facility] neuro 11/03/20.

## 2020-11-03 DIAGNOSIS — M7918 Myalgia, other site: Secondary | ICD-10-CM | POA: Diagnosis not present

## 2020-11-03 DIAGNOSIS — M62838 Other muscle spasm: Secondary | ICD-10-CM | POA: Diagnosis not present

## 2020-11-19 ENCOUNTER — Other Ambulatory Visit: Payer: Self-pay | Admitting: Internal Medicine

## 2020-11-19 DIAGNOSIS — I10 Essential (primary) hypertension: Secondary | ICD-10-CM

## 2020-11-22 ENCOUNTER — Other Ambulatory Visit: Payer: Self-pay | Admitting: Internal Medicine

## 2020-11-28 ENCOUNTER — Ambulatory Visit: Payer: Medicare Other | Admitting: Internal Medicine

## 2020-12-24 DIAGNOSIS — H8110 Benign paroxysmal vertigo, unspecified ear: Secondary | ICD-10-CM | POA: Insufficient documentation

## 2020-12-24 NOTE — Progress Notes (Signed)
Date:  12/25/2020   Name:  Bruce Richardson   DOB:  08/14/1951   MRN:  [id number]   Chief Complaint: Diabetes, Hypertension, and Shoulder Pain (Fell in dec, right shoulder is still hurting muscle relaxer isnt helping zanaflex)  Diabetes He presents for his follow-up diabetic visit. He has type 2 diabetes mellitus. His disease course has been stable. Pertinent negatives for hypoglycemia include no headaches or tremors. Pertinent negatives for diabetes include no chest pain, no fatigue, no polydipsia and no polyuria. Symptoms are stable. Diabetic complications include a CVA. Risk factors for coronary artery disease include dyslipidemia, hypertension and sedentary lifestyle. Current diabetic treatment includes oral agent (monotherapy). He is compliant with treatment all of the time. He is following a generally healthy diet. His breakfast blood glucose is taken between 7-8 am. His breakfast blood glucose range is generally 110-130 mg/dl. An ACE inhibitor/angiotensin II receptor blocker is being taken. Eye exam is not current.  Hypertension This is a chronic problem. The problem is controlled. Associated symptoms include neck pain. Pertinent negatives include no chest pain, headaches, palpitations or shortness of breath. Past treatments include ACE inhibitors. Hypertensive end-organ damage includes CVA.  Neck Pain  This is a chronic problem. The current episode started more than 1 month ago. The problem occurs constantly. The problem has been unchanged. The pain is associated with a fall. The pain is present in the right side and midline. The pain is moderate. Pertinent negatives include no chest pain, headaches or numbness. He has tried home exercises, NSAIDs and muscle relaxants for the symptoms.  He previously stated that he could not afford PT but now agrees to try at least one visit. He points to pain in the right posterior neck.   CT cervical spine 06/2020: IMPRESSION: CT of the head: Chronic  atrophic and ischemic changes without acute abnormality. CT of the cervical spine: Degenerative change without acute Abnormality.  Lab Results  Component Value Date   CREATININE 1.19 07/12/2020   BUN 18 07/12/2020   NA 141 07/12/2020   K 4.6 07/12/2020   CL 101 07/12/2020   CO2 23 07/12/2020   Lab Results  Component Value Date   CHOL 139 04/03/2020   HDL 34 (L) 04/03/2020   LDLCALC 84 04/03/2020   TRIG 104 04/03/2020   CHOLHDL 4.1 04/03/2020   Lab Results  Component Value Date   TSH 2.879 04/03/2020   Lab Results  Component Value Date   HGBA1C 7.9 (H) 07/12/2020   Lab Results  Component Value Date   WBC 7.4 06/26/2020   HGB 15.6 06/26/2020   HCT 47.1 06/26/2020   MCV 86.9 06/26/2020   PLT 244 06/26/2020   Lab Results  Component Value Date   ALT 17 04/03/2020   AST 17 04/03/2020   ALKPHOS 53 04/03/2020   BILITOT 1.1 04/03/2020     Review of Systems  Constitutional: Negative for appetite change, fatigue and unexpected weight change.  Eyes: Negative for visual disturbance.  Respiratory: Negative for cough, shortness of breath and wheezing.   Cardiovascular: Negative for chest pain, palpitations and leg swelling.  Gastrointestinal: Negative for abdominal pain and blood in stool.  Endocrine: Negative for polydipsia and polyuria.  Genitourinary: Negative for dysuria and hematuria.  Musculoskeletal: Positive for arthralgias (shoulder pain) and neck pain.  Skin: Negative for color change and rash.  Neurological: Negative for tremors, numbness and headaches.  Psychiatric/Behavioral: Negative for dysphoric mood.    Patient Active Problem List   Diagnosis Date  Noted  . BPPV (benign paroxysmal positional vertigo), unspecified laterality 12/24/2020  . Chronic diastolic congestive heart failure ([place]) 11/29/2019  . Centrilobular emphysema ([place]) 01/26/2019  . Aortic atherosclerosis ([place]) 01/26/2019  . Echocardiogram shows left ventricular diastolic dysfunction  51/09/5850  . Moderate episode of recurrent major depressive disorder ([place]) 02/16/2018  . Erectile dysfunction 02/16/2018  . History of CVA (cerebrovascular accident) without residual deficits 08/13/2017  . BPH without obstruction/lower urinary tract symptoms 03/19/2017  . Chronic bilateral low back pain 11/14/2016  . 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 GERD (gastroesophageal reflux disease) 04/11/2016  . Obesity 04/11/2016  . Allergic rhinitis 08/04/2015  . Essential hypertension 07/04/2015  . Hyperlipidemia due to type 2 diabetes mellitus ([place]) 07/04/2015  . Type II diabetes mellitus with complication ([place]) 77/82/4235  . Tendinitis of elbow or forearm 04/26/2015  . History of repair of rotator cuff 08/31/2014    No Known Allergies  Past Surgical History:  Procedure Laterality Date  . CIRCUMCISION N/A 07/24/2015   Procedure: CIRCUMCISION ADULT;  Surgeon: Nickie Retort, MD;  Location: [facility] ORS;  Service: Urology;  Laterality: N/A;  . EYE SURGERY Right    right eye cataract surgery   . SHOULDER ARTHROSCOPY Bilateral   . SHOULDER ARTHROSCOPY Right 12/22/2014   Procedure: RIGHT SHOULDER ARTHROSCOPY /DECOMPRESSION/ROTATOR CUFF REPAIR OF RECURRENT ROTATOR CUFF TEAR;  Surgeon: Corky Mull, MD;  Location: [facility] ORS;  Service: Orthopedics;  Laterality: Right;    Social History   Tobacco Use  . Smoking status: Former Smoker    Packs/day: 1.50    Years: 25.00    Pack years: 37.50    Types: Cigarettes    Start date: 12/19/2001    Quit date: 07/03/2002    Years since quitting: 18.4  . Smokeless tobacco: Former Systems developer  . Tobacco comment: quit smoking in 06/2001  Vaping Use  . Vaping Use: Never used  Substance Use Topics  . Alcohol use: No    Alcohol/week: 0.0 standard drinks  . Drug use: No     Medication list has been reviewed and updated.  Current Meds  Medication Sig  . albuterol (VENTOLIN HFA) 108 (90 Base) MCG/ACT inhaler Inhale 2 puffs into  the lungs every 6 (six) hours as needed for wheezing or shortness of breath.  Marland Kitchen amLODipine (NORVASC) 5 MG tablet TAKE 1 TABLET BY MOUTH  DAILY  . Ascorbic Acid (VITAMIN C) 1000 MG tablet Take 1,000 mg by mouth daily.  Marland Kitchen aspirin EC 81 MG tablet Take 1 tablet (81 mg total) by mouth daily.  . Blood Glucose Monitoring Suppl (ONE TOUCH ULTRA MINI) w/Device KIT 1 kit by Does not apply route as directed.  . fluticasone (FLONASE) 50 MCG/ACT nasal spray USE 1 SPRAY IN BOTH  NOSTRILS DAILY  . glucose blood test strip Check blood sugar twice daily. Dx.E11.9  . lisinopril (ZESTRIL) 40 MG tablet TAKE 1 TABLET BY MOUTH  DAILY  . metFORMIN (GLUCOPHAGE) 1000 MG tablet TAKE 1 TABLET BY MOUTH  TWICE DAILY WITH A MEAL  . naproxen (NAPROSYN) 500 MG tablet Take 1 tablet (500 mg total) by mouth 2 (two) times daily with a meal.  . ONETOUCH DELICA LANCETS 36R MISC 1 each by Does not apply route 2 (two) times daily. Dx: E11.9  . pantoprazole (PROTONIX) 40 MG tablet TAKE 1 TABLET BY MOUTH  DAILY  . rosuvastatin (CRESTOR) 40 MG tablet TAKE 1 TABLET BY MOUTH  DAILY  . tamsulosin (FLOMAX) 0.4 MG CAPS capsule TAKE  1 CAPSULE BY MOUTH  DAILY FOR PROSTATE  . vitamin B-12 (CYANOCOBALAMIN) 1000 MCG tablet Take 1,000 mcg by mouth daily.    PHQ 2/9 Scores 12/25/2020 10/18/2020 08/07/2020 07/12/2020  PHQ - 2 Score 3 3 0 0  PHQ- 9 Score 16 9 0 0  Exception Documentation - - - -    GAD 7 : Generalized Anxiety Score 12/25/2020 08/07/2020 07/12/2020 04/03/2020  Nervous, Anxious, on Edge 2 0 0 0  Control/stop worrying 3 0 0 1  Worry too much - different things 3 0 0 1  Trouble relaxing 3 0 0 2  Restless 2 0 0 0  Easily annoyed or irritable 3 0 0 0  Afraid - awful might happen 2 0 0 0  Total GAD 7 Score 18 0 0 4  Anxiety Difficulty Extremely difficult Not difficult at all - Not difficult at all    BP Readings from Last 3 Encounters:  12/25/20 138/86  08/07/20 124/70  07/12/20 (!) 144/78    Physical Exam Vitals and nursing  note reviewed.  Constitutional:      General: He is not in acute distress.    Appearance: Normal appearance. He is well-developed.  HENT:     Head: Normocephalic and atraumatic.  Neck:     Vascular: No carotid bruit.  Cardiovascular:     Rate and Rhythm: Normal rate.     Pulses: Normal pulses.  Pulmonary:     Effort: Pulmonary effort is normal. No respiratory distress.     Breath sounds: No wheezing or rhonchi.  Musculoskeletal:     Right shoulder: Normal.     Left shoulder: Normal.     Cervical back: Tenderness present. Pain with movement present. Decreased range of motion.     Right lower leg: No edema.     Left lower leg: No edema.  Lymphadenopathy:     Cervical: No cervical adenopathy.  Skin:    General: Skin is warm and dry.     Findings: No rash.  Neurological:     Mental Status: He is alert and oriented to person, place, and time.  Psychiatric:        Attention and Perception: Attention normal.        Mood and Affect: Mood normal.        Behavior: Behavior normal.        Thought Content: Thought content does not include suicidal ideation. Thought content does not include suicidal plan.     Wt Readings from Last 3 Encounters:  12/25/20 227 lb (103 kg)  08/07/20 224 lb (101.6 kg)  07/12/20 225 lb (102.1 kg)    BP 138/86   Pulse 75   Temp 98.3 F (36.8 C) (Oral)   Ht _0  (1.778 m)   Wt 227 lb (103 kg)   SpO2 94%   BMI 32.57 kg/m   Assessment and Plan: 1. Type II diabetes mellitus with complication (HCC) Clinically stable by exam and report without s/s of hypoglycemia. DM complicated by HTN. Tolerating medications - metformin -  well without side effects or other concerns. Recommend follow up in 4 mo but patient insists on 6 mo interval. - Comprehensive metabolic panel - Hemoglobin A1c  2. Essential hypertension Clinically stable exam with well controlled BP. Tolerating medications without side effects at this time. Pt to continue current regimen  and low sodium diet; benefits of regular exercise as able discussed.  3. BPPV (benign paroxysmal positional vertigo), unspecified laterality Continues to have sx since his  fall Seen by Neurology -prescribed tizanidine with no improvement; then recommended gabapentin but did not take gabapentin for long due to dizziness (or concern for dizziness) - Ambulatory referral to Physical Therapy  4. Chronic neck pain with normal neurological examination He had declined or failed medications due to side effects or lack of efficacy Recommend he try PT - he agrees to one visit to start - Ambulatory referral to Physical Therapy

## 2020-12-25 ENCOUNTER — Ambulatory Visit (INDEPENDENT_AMBULATORY_CARE_PROVIDER_SITE_OTHER): Payer: Medicare Other | Admitting: Internal Medicine

## 2020-12-25 ENCOUNTER — Other Ambulatory Visit: Payer: Self-pay

## 2020-12-25 ENCOUNTER — Encounter: Payer: Self-pay | Admitting: Internal Medicine

## 2020-12-25 VITALS — BP 138/86 | HR 75 | Temp 98.3°F | Ht 70.0 in | Wt 227.0 lb

## 2020-12-25 DIAGNOSIS — G8929 Other chronic pain: Secondary | ICD-10-CM | POA: Diagnosis not present

## 2020-12-25 DIAGNOSIS — E118 Type 2 diabetes mellitus with unspecified complications: Secondary | ICD-10-CM

## 2020-12-25 DIAGNOSIS — M542 Cervicalgia: Secondary | ICD-10-CM

## 2020-12-25 DIAGNOSIS — H8110 Benign paroxysmal vertigo, unspecified ear: Secondary | ICD-10-CM

## 2020-12-25 DIAGNOSIS — I10 Essential (primary) hypertension: Secondary | ICD-10-CM | POA: Diagnosis not present

## 2020-12-26 ENCOUNTER — Other Ambulatory Visit: Payer: Self-pay

## 2020-12-26 DIAGNOSIS — E118 Type 2 diabetes mellitus with unspecified complications: Secondary | ICD-10-CM

## 2020-12-26 LAB — COMPREHENSIVE METABOLIC PANEL
ALT: 17 IU/L (ref 0–44)
AST: 13 IU/L (ref 0–40)
Albumin/Globulin Ratio: 1.5 (ref 1.2–2.2)
Albumin: 4.3 g/dL (ref 3.8–4.8)
Alkaline Phosphatase: 76 IU/L (ref 44–121)
BUN/Creatinine Ratio: 10 (ref 10–24)
BUN: 12 mg/dL (ref 8–27)
Bilirubin Total: 0.6 mg/dL (ref 0.0–1.2)
CO2: 23 mmol/L (ref 20–29)
Calcium: 9.5 mg/dL (ref 8.6–10.2)
Chloride: 103 mmol/L (ref 96–106)
Creatinine, Ser: 1.19 mg/dL (ref 0.76–1.27)
Globulin, Total: 2.8 g/dL (ref 1.5–4.5)
Glucose: 182 mg/dL — ABNORMAL HIGH (ref 65–99)
Potassium: 4.9 mmol/L (ref 3.5–5.2)
Sodium: 141 mmol/L (ref 134–144)
Total Protein: 7.1 g/dL (ref 6.0–8.5)
eGFR: 66 mL/min/{1.73_m2} (ref >59)

## 2020-12-26 LAB — HEMOGLOBIN A1C
Est. average glucose Bld gHb Est-mCnc: 189 mg/dL
Hgb A1c MFr Bld: 8.2 % — ABNORMAL HIGH (ref 4.8–5.6)

## 2020-12-26 MED ORDER — GLIPIZIDE ER 2.5 MG PO TB24: 2.5000 mg | ORAL_TABLET | Freq: Every day | ORAL | 1 refills | Status: DC

## 2021-02-06 LAB — HM DIABETES EYE EXAM

## 2021-02-09 ENCOUNTER — Encounter: Payer: Self-pay | Admitting: Internal Medicine

## 2021-02-12 ENCOUNTER — Other Ambulatory Visit: Payer: Self-pay | Admitting: Internal Medicine

## 2021-02-12 DIAGNOSIS — I10 Essential (primary) hypertension: Secondary | ICD-10-CM

## 2021-03-03 ENCOUNTER — Other Ambulatory Visit: Payer: Self-pay | Admitting: Internal Medicine

## 2021-03-03 DIAGNOSIS — E1169 Type 2 diabetes mellitus with other specified complication: Secondary | ICD-10-CM

## 2021-03-03 DIAGNOSIS — N400 Enlarged prostate without lower urinary tract symptoms: Secondary | ICD-10-CM

## 2021-03-03 DIAGNOSIS — K219 Gastro-esophageal reflux disease without esophagitis: Secondary | ICD-10-CM

## 2021-03-03 NOTE — Telephone Encounter (Signed)
Requested Prescriptions  Pending Prescriptions Disp Refills  . pantoprazole (PROTONIX) 40 MG tablet [Pharmacy Med Name: Pantoprazole Sodium 40 MG Oral Tablet Delayed Release] 90 tablet 0    Sig: TAKE 1 TABLET BY MOUTH  DAILY     Gastroenterology: Proton Pump Inhibitors Passed - 03/03/2021 11:24 AM      Passed - Valid encounter within last 12 months    Recent Outpatient Visits          2 months ago Type II diabetes mellitus with complication [facility])   [facility] Reubin Milan, MD   6 months ago Essential hypertension   [facility] Reubin Milan, MD   7 months ago Essential hypertension   [facility] Reubin Milan, MD   11 months ago Annual physical exam   [facility] Reubin Milan, MD   1 year ago Type II diabetes mellitus with complication [facility])   [facility] Reubin Milan, MD      Future Appointments            In 1 month Judithann Graves Nyoka Cowden, MD [facility]   In 3 months Judithann Graves, Nyoka Cowden, MD [facility]           . tamsulosin (FLOMAX) 0.4 MG CAPS capsule [Pharmacy Med Name: Tamsulosin HCl 0.4 MG Oral Capsule] 90 capsule 0    Sig: TAKE 1 CAPSULE BY MOUTH  DAILY FOR PROSTATE     Urology: Alpha-Adrenergic Blocker Passed - 03/03/2021 11:24 AM      Passed - Last BP in normal range    BP Readings from Last 1 Encounters:  12/25/20 138/86         Passed - Valid encounter within last 12 months    Recent Outpatient Visits          2 months ago Type II diabetes mellitus with complication [facility])   [facility] Reubin Milan, MD   6 months ago Essential hypertension   [facility] Reubin Milan, MD   7 months ago Essential hypertension   [facility] Reubin Milan, MD   11 months ago Annual physical exam   [facility] Reubin Milan, MD   1 year ago Type II diabetes mellitus with complication [facility])   [facility] Reubin Milan, MD      Future Appointments            In 1 month Judithann Graves Nyoka Cowden, MD [facility], [facility]   In 3 months Judithann Graves Nyoka Cowden, MD [facility], [facility]           . rosuvastatin (CRESTOR) 40 MG tablet [Pharmacy Med Name: Rosuvastatin Calcium 40 MG Oral Tablet] 90 tablet 0    Sig: TAKE 1 TABLET BY MOUTH  DAILY     Cardiovascular:  Antilipid - Statins Failed - 03/03/2021 11:24 AM      Failed - HDL in normal range and within 360 days    HDL  Date Value Ref Range Status  04/03/2020 34 (L) >40 mg/dL Final  69/62/9528 37 (L) >39 mg/dL Final         Passed - Total Cholesterol in normal range and within 360 days    Cholesterol, Total  Date Value Ref Range Status  04/02/2019 162 100 - 199 mg/dL Final   Cholesterol  Date Value Ref Range Status  04/03/2020 139 0 - 200 mg/dL Final  Passed - LDL in normal range and within 360 days    LDL Cholesterol (Calc)  Date Value Ref Range Status  02/10/2018 86 mg/dL (calc) Final    Comment:    Reference range: <100 . Desirable range <100 mg/dL for primary prevention;   <70 mg/dL for patients with CHD or diabetic patients  with > or = 2 CHD risk factors. Marland Kitchen LDL-C is now calculated using the Martin-Hopkins  calculation, which is a validated novel method providing  better accuracy than the Friedewald equation in the  estimation of LDL-C.  Horald Pollen et al. Lenox Ahr. 4010;272(53): 2061-2068  (http://education.[employer].com/faq/FAQ164)    LDL Calculated  Date Value Ref Range Status  04/02/2019 111 (H) 0 - 99 mg/dL Final   LDL Cholesterol  Date Value Ref Range Status  04/03/2020 84 0 - 99 mg/dL Final    Comment:           Total Cholesterol/HDL:CHD Risk Coronary Heart Disease Risk Table                     Men   Women  1/2 Average Risk   3.4   3.3  Average Risk       5.0   4.4  2 X Average Risk   9.6   7.1  3 X Average Risk  23.4   11.0        Use the calculated Patient Ratio above and the CHD Risk  Table to determine the patient's CHD Risk.        ATP III CLASSIFICATION (LDL):  <100     mg/dL   Optimal  664-403  mg/dL   Near or Above                    Optimal  130-159  mg/dL   Borderline  474-259  mg/dL   High  >563     mg/dL   Very High Performed at [facility], [address] [place], [place] [address]          Passed - Triglycerides in normal range and within 360 days    Triglycerides  Date Value Ref Range Status  04/03/2020 104 <150 mg/dL Final         Passed - Patient is not pregnant      Passed - Valid encounter within last 12 months    Recent Outpatient Visits          2 months ago Type II diabetes mellitus with complication [facility])   [facility] Reubin Milan, MD   6 months ago Essential hypertension   [facility] Reubin Milan, MD   7 months ago Essential hypertension   [facility] Reubin Milan, MD   11 months ago Annual physical exam   [facility] Reubin Milan, MD   1 year ago Type II diabetes mellitus with complication [facility])   [facility] Reubin Milan, MD      Future Appointments            In 1 month Judithann Graves Nyoka Cowden, MD [facility], [facility]   In 3 months Judithann Graves Nyoka Cowden, MD [facility], [facility]

## 2021-03-04 ENCOUNTER — Other Ambulatory Visit: Payer: Self-pay | Admitting: Internal Medicine

## 2021-03-04 NOTE — Telephone Encounter (Signed)
Requested Prescriptions  Pending Prescriptions Disp Refills  . albuterol (VENTOLIN HFA) 108 (90 Base) MCG/ACT inhaler [Pharmacy Med Name: Albuterol Sulfate HFA 108 (90 Base) MCG/ACT Inhalation Aerosol Solution] 18 g 1    Sig: USE 2 INHALATIONS BY MOUTH  EVERY 6 HOURS AS NEEDED FOR WHEEZING OR SHORTNESS OF  BREATH     Pulmonology:  Beta Agonists Failed - 03/04/2021 12:31 AM      Failed - One inhaler should last at least one month. If the patient is requesting refills earlier, contact the patient to check for uncontrolled symptoms.      Passed - Valid encounter within last 12 months    Recent Outpatient Visits          2 months ago Type II diabetes mellitus with complication [facility])   [facility] Reubin Milan, MD   6 months ago Essential hypertension   [facility] Reubin Milan, MD   7 months ago Essential hypertension   [facility] Reubin Milan, MD   11 months ago Annual physical exam   [facility] Reubin Milan, MD   1 year ago Type II diabetes mellitus with complication [facility])   [facility] Reubin Milan, MD      Future Appointments            In 1 month Judithann Graves Nyoka Cowden, MD [facility], [facility]   In 3 months Judithann Graves Nyoka Cowden, MD [facility] [place]

## 2021-04-05 ENCOUNTER — Ambulatory Visit (INDEPENDENT_AMBULATORY_CARE_PROVIDER_SITE_OTHER): Payer: Medicare Other | Admitting: Internal Medicine

## 2021-04-05 ENCOUNTER — Other Ambulatory Visit: Payer: Self-pay

## 2021-04-05 ENCOUNTER — Encounter: Payer: Self-pay | Admitting: Internal Medicine

## 2021-04-05 VITALS — BP 136/80 | HR 95 | Temp 98.3°F | Ht 70.0 in | Wt 232.0 lb

## 2021-04-05 DIAGNOSIS — E1169 Type 2 diabetes mellitus with other specified complication: Secondary | ICD-10-CM

## 2021-04-05 DIAGNOSIS — N400 Enlarged prostate without lower urinary tract symptoms: Secondary | ICD-10-CM | POA: Diagnosis not present

## 2021-04-05 DIAGNOSIS — I10 Essential (primary) hypertension: Secondary | ICD-10-CM | POA: Diagnosis not present

## 2021-04-05 DIAGNOSIS — E118 Type 2 diabetes mellitus with unspecified complications: Secondary | ICD-10-CM | POA: Diagnosis not present

## 2021-04-05 DIAGNOSIS — Z0000 Encounter for general adult medical examination without abnormal findings: Secondary | ICD-10-CM

## 2021-04-05 DIAGNOSIS — G8929 Other chronic pain: Secondary | ICD-10-CM | POA: Diagnosis not present

## 2021-04-05 DIAGNOSIS — M542 Cervicalgia: Secondary | ICD-10-CM | POA: Diagnosis not present

## 2021-04-05 DIAGNOSIS — E785 Hyperlipidemia, unspecified: Secondary | ICD-10-CM

## 2021-04-05 LAB — POCT URINALYSIS DIPSTICK
Bilirubin, UA: NEGATIVE
Glucose, UA: NEGATIVE
Ketones, UA: NEGATIVE
Leukocytes, UA: NEGATIVE
Nitrite, UA: NEGATIVE
Protein, UA: POSITIVE — AB
Spec Grav, UA: 1.02 (ref 1.010–1.025)
Urobilinogen, UA: 0.2 E.U./dL
pH, UA: 5 (ref 5.0–8.0)

## 2021-04-05 MED ORDER — BACLOFEN 20 MG PO TABS: 20.0000 mg | ORAL_TABLET | Freq: Three times a day (TID) | ORAL | 0 refills | Status: DC

## 2021-04-05 NOTE — Progress Notes (Signed)
Date:  04/05/2021   Name:  Bruce Richardson   DOB:  May 29, 1951   MRN:  [id number]   Chief Complaint: Annual Exam (Foot exam ) Bruce Richardson is a 70 y.o. male who presents today for his Complete Annual Exam. He feels fairly well. He reports exercising some on the treadmill. He reports he is sleeping fairly well.   Colonoscopy: cologuard 03/2020  Immunization History  Administered Date(s) Administered   Moderna Sars-Covid-2 Vaccination 11/15/2019, 04/25/2020, 05/23/2020, 11/14/2020    Diabetes He presents for his follow-up diabetic visit. He has type 2 diabetes mellitus. His disease course has been worsening. Hypoglycemia symptoms include nervousness/anxiousness. Pertinent negatives for hypoglycemia include no dizziness or headaches. Pertinent negatives for diabetes include no chest pain and no fatigue. Diabetic complications include a CVA. Risk factors for coronary artery disease include diabetes mellitus. Current diabetic treatment includes oral agent (dual therapy) (metformin, glipizide added last visit).  Hypertension This is a chronic problem. The problem is controlled. Associated symptoms include neck pain. Pertinent negatives include no chest pain, headaches, palpitations or shortness of breath. Past treatments include ACE inhibitors and calcium channel blockers. 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  Hyperlipidemia This is a chronic problem. The problem is controlled. Pertinent negatives include no chest pain, myalgias or shortness of breath. Current antihyperlipidemic treatment includes statins.  Neck Pain  This is a chronic problem. The problem has been unchanged. The pain is present in the midline. The quality of the pain is described as aching. The pain is moderate. The pain is Same all the time. Pertinent negatives include no chest pain, headaches or trouble swallowing. He has tried muscle relaxants (baclofen works the best) for the symptoms. Improvement on treatment:  referred to PT but he can not afford the copay.   Lab Results  Component Value Date   CREATININE 1.19 12/25/2020   BUN 12 12/25/2020   NA 141 12/25/2020   K 4.9 12/25/2020   CL 103 12/25/2020   CO2 23 12/25/2020   Lab Results  Component Value Date   CHOL 139 04/03/2020   HDL 34 (L) 04/03/2020   LDLCALC 84 04/03/2020   TRIG 104 04/03/2020   CHOLHDL 4.1 04/03/2020   Lab Results  Component Value Date   TSH 2.879 04/03/2020   Lab Results  Component Value Date   HGBA1C 8.2 (H) 12/25/2020   Lab Results  Component Value Date   WBC 7.4 06/26/2020   HGB 15.6 06/26/2020   HCT 47.1 06/26/2020   MCV 86.9 06/26/2020   PLT 244 06/26/2020   Lab Results  Component Value Date   ALT 17 12/25/2020   AST 13 12/25/2020   ALKPHOS 76 12/25/2020   BILITOT 0.6 12/25/2020  PSA 0.41   Review of Systems  Constitutional:  Negative for appetite change, chills, diaphoresis, fatigue and unexpected weight change.  HENT:  Negative for hearing loss, tinnitus, trouble swallowing and voice change.   Eyes:  Negative for visual disturbance.  Respiratory:  Negative for choking, shortness of breath and wheezing.   Cardiovascular:  Negative for chest pain, palpitations and leg swelling.  Gastrointestinal:  Negative for abdominal pain, blood in stool, constipation and diarrhea.  Genitourinary:  Positive for frequency (nocturia x 2). Negative for difficulty urinating and dysuria.  Musculoskeletal:  Positive for arthralgias, neck pain and neck stiffness. Negative for back pain and myalgias.  Skin:  Negative for color change and rash.  Neurological:  Negative for dizziness, syncope and headaches.  Hematological:  Negative for adenopathy.  Psychiatric/Behavioral:  Positive for dysphoric mood. Negative for sleep disturbance. The patient is nervous/anxious.    Patient Active Problem List   Diagnosis Date Noted   BPPV (benign paroxysmal positional vertigo), unspecified laterality 12/24/2020   Chronic  diastolic congestive heart failure ([place]) 11/29/2019   Centrilobular emphysema ([place]) 01/26/2019   Aortic atherosclerosis ([place]) 01/26/2019   Echocardiogram shows left ventricular diastolic dysfunction 10/31/9456   Moderate episode of recurrent major depressive disorder ([place]) 02/16/2018   Erectile dysfunction 02/16/2018   History of CVA (cerebrovascular accident) without residual deficits 08/13/2017   BPH without obstruction/lower urinary tract symptoms 03/19/2017   Chronic bilateral low back pain 11/14/2016   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) 07/04/2015   Type II diabetes mellitus with complication ([place]) 59/29/2446   History of repair of rotator cuff 08/31/2014    No Known Allergies  Past Surgical History:  Procedure Laterality Date   CIRCUMCISION N/A 07/24/2015   Procedure: CIRCUMCISION ADULT;  Surgeon: Nickie Retort, MD;  Location: [facility] ORS;  Service: Urology;  Laterality: N/A;   EYE SURGERY Right    right eye cataract surgery    SHOULDER ARTHROSCOPY Bilateral    SHOULDER ARTHROSCOPY Right 12/22/2014   Procedure: RIGHT SHOULDER ARTHROSCOPY /DECOMPRESSION/ROTATOR CUFF REPAIR OF RECURRENT ROTATOR CUFF TEAR;  Surgeon: Corky Mull, MD;  Location: [facility] ORS;  Service: Orthopedics;  Laterality: Right;    Social History   Tobacco Use   Smoking status: Former    Packs/day: 1.50    Years: 25.00    Pack years: 37.50    Types: Cigarettes    Start date: 12/19/2001    Quit date: 07/03/2002    Years since quitting: 18.7   Smokeless tobacco: Former   Tobacco comments:    quit smoking in 06/2001  Vaping Use   Vaping Use: Never used  Substance Use Topics   Alcohol use: No    Alcohol/week: 0.0 standard drinks   Drug use: No     Medication list has been reviewed  and updated.  Current Meds  Medication Sig   albuterol (VENTOLIN HFA) 108 (90 Base) MCG/ACT inhaler USE 2 INHALATIONS BY MOUTH  EVERY 6 HOURS AS NEEDED FOR WHEEZING OR SHORTNESS OF  BREATH   amLODipine (NORVASC) 5 MG tablet TAKE 1 TABLET BY MOUTH  DAILY   aspirin EC 81 MG tablet Take 1 tablet (81 mg total) by mouth daily.   Blood Glucose Monitoring Suppl (ONE TOUCH ULTRA MINI) w/Device KIT 1 kit by Does not apply route as directed.   fluticasone (FLONASE) 50 MCG/ACT nasal spray USE 1 SPRAY IN BOTH  NOSTRILS DAILY (Patient taking differently: as needed.)   glipiZIDE (GLUCOTROL XL) 2.5 MG 24 hr tablet Take 1 tablet (2.5 mg total) by mouth daily with breakfast.   glucose blood test strip Check blood sugar twice daily. Dx.E11.9   lisinopril (ZESTRIL) 40 MG tablet TAKE 1 TABLET BY MOUTH  DAILY   metFORMIN (GLUCOPHAGE) 1000 MG tablet TAKE 1 TABLET BY MOUTH  TWICE DAILY WITH A MEAL   naproxen (NAPROSYN) 500 MG tablet Take 1 tablet (500 mg total) by mouth 2 (two) times daily with a meal.   ONETOUCH DELICA LANCETS 28M MISC 1 each by Does not apply route 2 (two) times daily. Dx: E11.9   pantoprazole (PROTONIX)  40 MG tablet TAKE 1 TABLET BY MOUTH  DAILY   rosuvastatin (CRESTOR) 40 MG tablet TAKE 1 TABLET BY MOUTH  DAILY   tamsulosin (FLOMAX) 0.4 MG CAPS capsule TAKE 1 CAPSULE BY MOUTH  DAILY FOR PROSTATE   tiZANidine (ZANAFLEX) 4 MG tablet Take by mouth.   vitamin B-12 (CYANOCOBALAMIN) 1000 MCG tablet Take 1,000 mcg by mouth daily.   [DISCONTINUED] Ascorbic Acid (VITAMIN C) 1000 MG tablet Take 1,000 mg by mouth daily.    PHQ 2/9 Scores 04/05/2021 12/25/2020 10/18/2020 08/07/2020  PHQ - 2 Score 0 3 3 0  PHQ- 9 Score _0 0  Exception Documentation - - - -    GAD 7 : Generalized Anxiety Score 04/05/2021 12/25/2020 08/07/2020 07/12/2020  Nervous, Anxious, on Edge 0 2 0 0  Control/stop worrying 3 3 0 0  Worry too much - different things 3 3 0 0  Trouble relaxing 2 3 0 0  Restless 2 2 0 0  Easily  annoyed or irritable 3 3 0 0  Afraid - awful might happen 0 2 0 0  Total GAD 7 Score 13 18 0 0  Anxiety Difficulty - Extremely difficult Not difficult at all -    BP Readings from Last 3 Encounters:  04/05/21 136/80  12/25/20 138/86  08/07/20 124/70    Physical Exam Vitals and nursing note reviewed.  Constitutional:      Appearance: Normal appearance. He is well-developed.  HENT:     Head: Normocephalic.     Right Ear: Tympanic membrane, ear canal and external ear normal.     Left Ear: Tympanic membrane, ear canal and external ear normal.     Nose: Nose normal.  Eyes:     Conjunctiva/sclera: Conjunctivae normal.     Pupils: Pupils are equal, round, and reactive to light.  Neck:     Thyroid: No thyromegaly.     Vascular: No carotid bruit.  Cardiovascular:     Rate and Rhythm: Normal rate and regular rhythm.     Pulses: Normal pulses.     Heart sounds: Normal heart sounds.  Pulmonary:     Effort: Pulmonary effort is normal.     Breath sounds: Normal breath sounds. No wheezing or rhonchi.  Chest:  Breasts:    Right: No mass.     Left: No mass.  Abdominal:     General: Bowel sounds are normal.     Palpations: Abdomen is soft.     Tenderness: There is no abdominal tenderness.     Hernia: No hernia is present.  Musculoskeletal:     Cervical back: Neck supple. Pain with movement present. Decreased range of motion.     Right lower leg: No edema.     Left lower leg: No edema.  Lymphadenopathy:     Cervical: No cervical adenopathy.  Skin:    General: Skin is warm and dry.     Capillary Refill: Capillary refill takes less than 2 seconds.  Neurological:     General: No focal deficit present.     Mental Status: He is alert and oriented to person, place, and time.     Deep Tendon Reflexes: Reflexes are normal and symmetric.  Psychiatric:        Attention and Perception: Attention normal.        Mood and Affect: Mood normal.        Thought Content: Thought content normal.     Wt Readings from Last 3 Encounters:  04/05/21 232  lb (105.2 kg)  12/25/20 227 lb (103 kg)  08/07/20 224 lb (101.6 kg)    BP 136/80 (BP Location: Right Arm, Patient Position: Sitting, Cuff Size: Normal)   Pulse 95   Temp 98.3 F (36.8 C) (Oral)   Ht _0  (1.778 m)   Wt 232 lb (105.2 kg)   SpO2 96%   BMI 33.29 kg/m   Assessment and Plan: 1. Annual physical exam Continue work on healthy diet, exercise as able He declines all vaccines other than Covid Cologuard done last year.  2. Essential hypertension Clinically stable exam with well controlled BP. Tolerating medications without side effects at this time. Pt to continue current regimen and low sodium diet; benefits of regular exercise as able discussed. - CBC with Differential/Platelet - POCT urinalysis dipstick - negative except trace protein; pt is on ACEI  3. Type II diabetes mellitus with complication (HCC) Clinically stable by exam and report without s/s of hypoglycemia. DM complicated by hypertension and dyslipidemia. Tolerating medications well without side effects or other concerns. A1C was higher last visit - glipzide added with some improvement - Comprehensive metabolic panel - Hemoglobin A1c  4. Hyperlipidemia due to type 2 diabetes mellitus ([place]) Tolerating statin medication without side effects at this time LDL is at goal of < 70 on current dose Continue same therapy without change at this time. - Lipid panel  5. BPH without obstruction/lower urinary tract symptoms Stable sx; tolerating medication without side effects DRE deferred - PSA  6. Chronic neck pain with normal neurological examination Declines referral or PTx due to cost Will increase dose of Baclofen - baclofen (LIORESAL) 20 MG tablet; Take 1 tablet (20 mg total) by mouth 3 (three) times daily.  Dispense: 270 each; Refill: 0   Partially dictated using Editor, commissioning. Any errors are unintentional.  Halina Maidens, MD [facility]  04/05/2021

## 2021-04-06 LAB — COMPREHENSIVE METABOLIC PANEL
ALT: 15 IU/L (ref 0–44)
AST: 11 IU/L (ref 0–40)
Albumin/Globulin Ratio: 1.6 (ref 1.2–2.2)
Albumin: 4.6 g/dL (ref 3.7–4.7)
Alkaline Phosphatase: 81 IU/L (ref 44–121)
BUN/Creatinine Ratio: 13 (ref 10–24)
BUN: 15 mg/dL (ref 8–27)
Bilirubin Total: 0.7 mg/dL (ref 0.0–1.2)
CO2: 24 mmol/L (ref 20–29)
Calcium: 9.9 mg/dL (ref 8.6–10.2)
Chloride: 100 mmol/L (ref 96–106)
Creatinine, Ser: 1.14 mg/dL (ref 0.76–1.27)
Globulin, Total: 2.8 g/dL (ref 1.5–4.5)
Glucose: 184 mg/dL — ABNORMAL HIGH (ref 65–99)
Potassium: 4.7 mmol/L (ref 3.5–5.2)
Sodium: 138 mmol/L (ref 134–144)
Total Protein: 7.4 g/dL (ref 6.0–8.5)
eGFR: 69 mL/min/{1.73_m2} (ref >59)

## 2021-04-06 LAB — LIPID PANEL
Chol/HDL Ratio: 3.8 ratio (ref 0.0–5.0)
Cholesterol, Total: 147 mg/dL (ref 100–199)
HDL: 39 mg/dL — ABNORMAL LOW (ref >39)
LDL Chol Calc (NIH): 90 mg/dL (ref 0–99)
Triglycerides: 94 mg/dL (ref 0–149)
VLDL Cholesterol Cal: 18 mg/dL (ref 5–40)

## 2021-04-06 LAB — CBC WITH DIFFERENTIAL/PLATELET
Basophils Absolute: 0.1 10*3/uL (ref 0.0–0.2)
Basos: 1 %
EOS (ABSOLUTE): 0.2 10*3/uL (ref 0.0–0.4)
Eos: 3 %
Hematocrit: 45.4 % (ref 37.5–51.0)
Hemoglobin: 15 g/dL (ref 13.0–17.7)
Immature Grans (Abs): 0 10*3/uL (ref 0.0–0.1)
Immature Granulocytes: 0 %
Lymphocytes Absolute: 2.5 10*3/uL (ref 0.7–3.1)
Lymphs: 35 %
MCH: 28.3 pg (ref 26.6–33.0)
MCHC: 33 g/dL (ref 31.5–35.7)
MCV: 86 fL (ref 79–97)
Monocytes Absolute: 0.5 10*3/uL (ref 0.1–0.9)
Monocytes: 8 %
Neutrophils Absolute: 3.8 10*3/uL (ref 1.4–7.0)
Neutrophils: 53 %
Platelets: 278 10*3/uL (ref 150–450)
RBC: 5.3 x10E6/uL (ref 4.14–5.80)
RDW: 12.3 % (ref 11.6–15.4)
WBC: 7.2 10*3/uL (ref 3.4–10.8)

## 2021-04-06 LAB — PSA: Prostate Specific Ag, Serum: 0.5 ng/mL (ref 0.0–4.0)

## 2021-04-06 LAB — HEMOGLOBIN A1C
Est. average glucose Bld gHb Est-mCnc: 160 mg/dL
Hgb A1c MFr Bld: 7.2 % — ABNORMAL HIGH (ref 4.8–5.6)

## 2021-04-08 ENCOUNTER — Encounter: Payer: Self-pay | Admitting: Internal Medicine

## 2021-04-23 ENCOUNTER — Other Ambulatory Visit: Payer: Self-pay | Admitting: Internal Medicine

## 2021-04-23 DIAGNOSIS — E118 Type 2 diabetes mellitus with unspecified complications: Secondary | ICD-10-CM

## 2021-04-25 NOTE — Telephone Encounter (Signed)
Requested medication (s) are due for refill today: No  Requested medication (s) are on the active medication list: Yes  Last refill:  12/26/20  Future visit scheduled: Yes  Notes to clinic:  Pharmacy requesting 1 year supply.    Requested Prescriptions  Pending Prescriptions Disp Refills   glipiZIDE (GLUCOTROL XL) 2.5 MG 24 hr tablet [Pharmacy Med Name: GLIPIZIDE  2.5MG   TAB  XL] 90 tablet 3    Sig: TAKE 1 TABLET BY MOUTH  DAILY WITH BREAKFAST     Endocrinology:  Diabetes - Sulfonylureas Passed - 04/24/2021  7:26 PM      Passed - HBA1C is between 0 and 7.9 and within 180 days    Hgb A1c MFr Bld  Date Value Ref Range Status  04/05/2021 7.2 (H) 4.8 - 5.6 % Final    Comment:             Prediabetes: 5.7 - 6.4          Diabetes: >6.4          Glycemic control for adults with diabetes: <7.0           Passed - Valid encounter within last 6 months    Recent Outpatient Visits           2 weeks ago Annual physical exam   [facility] Reubin Milan, MD   4 months ago Type II diabetes mellitus with complication [facility])   [facility] Reubin Milan, MD   8 months ago Essential hypertension   [facility] Reubin Milan, MD   9 months ago Essential hypertension   [facility] Reubin Milan, MD   1 year ago Annual physical exam   [facility] Reubin Milan, MD       Future Appointments             In 3 months Judithann Graves Nyoka Cowden, MD [facility], [facility]   In 11 months Judithann Graves Nyoka Cowden, MD [facility], [facility]

## 2021-04-29 ENCOUNTER — Other Ambulatory Visit: Payer: Self-pay | Admitting: Internal Medicine

## 2021-04-29 NOTE — Telephone Encounter (Signed)
Requested Prescriptions  Pending Prescriptions Disp Refills  . albuterol (VENTOLIN HFA) 108 (90 Base) MCG/ACT inhaler [Pharmacy Med Name: Albuterol Sulfate HFA 108 (90 Base) MCG/ACT Inhalation Aerosol Solution] 34 g 3    Sig: USE 2 INHALATIONS BY MOUTH  EVERY 6 HOURS AS NEEDED FOR WHEEZING OR SHORTNESS OF  BREATH     Pulmonology:  Beta Agonists Failed - 04/29/2021 12:00 AM      Failed - One inhaler should last at least one month. If the patient is requesting refills earlier, contact the patient to check for uncontrolled symptoms.      Passed - Valid encounter within last 12 months    Recent Outpatient Visits          3 weeks ago Annual physical exam   [facility] Reubin Milan, MD   4 months ago Type II diabetes mellitus with complication [facility])   [facility] Reubin Milan, MD   8 months ago Essential hypertension   [facility] Reubin Milan, MD   9 months ago Essential hypertension   [facility] Reubin Milan, MD   1 year ago Annual physical exam   [facility] Reubin Milan, MD      Future Appointments            In 3 months Judithann Graves Nyoka Cowden, MD [facility], [facility]   In 11 months Judithann Graves Nyoka Cowden, MD [facility], [facility]

## 2021-05-16 ENCOUNTER — Other Ambulatory Visit: Payer: Self-pay | Admitting: Internal Medicine

## 2021-05-23 ENCOUNTER — Other Ambulatory Visit: Payer: Self-pay | Admitting: Internal Medicine

## 2021-05-23 DIAGNOSIS — N400 Enlarged prostate without lower urinary tract symptoms: Secondary | ICD-10-CM

## 2021-05-23 DIAGNOSIS — E1169 Type 2 diabetes mellitus with other specified complication: Secondary | ICD-10-CM

## 2021-05-23 DIAGNOSIS — K219 Gastro-esophageal reflux disease without esophagitis: Secondary | ICD-10-CM

## 2021-05-23 DIAGNOSIS — E785 Hyperlipidemia, unspecified: Secondary | ICD-10-CM

## 2021-06-12 ENCOUNTER — Ambulatory Visit: Payer: Medicare Other | Admitting: Internal Medicine

## 2021-07-03 ENCOUNTER — Other Ambulatory Visit: Payer: Self-pay | Admitting: Internal Medicine

## 2021-07-03 DIAGNOSIS — E118 Type 2 diabetes mellitus with unspecified complications: Secondary | ICD-10-CM

## 2021-07-03 NOTE — Telephone Encounter (Signed)
Requested Prescriptions  Pending Prescriptions Disp Refills  . glipiZIDE (GLUCOTROL XL) 2.5 MG 24 hr tablet [Pharmacy Med Name: glipiZIDE ER 2.5 MG Oral Tablet Extended Release 24 Hour] 90 tablet 1    Sig: TAKE 1 TABLET BY MOUTH  DAILY WITH BREAKFAST     Endocrinology:  Diabetes - Sulfonylureas Passed - 07/03/2021 10:05 AM      Passed - HBA1C is between 0 and 7.9 and within 180 days    Hgb A1c MFr Bld  Date Value Ref Range Status  04/05/2021 7.2 (H) 4.8 - 5.6 % Final    Comment:             Prediabetes: 5.7 - 6.4          Diabetes: >6.4          Glycemic control for adults with diabetes: <7.0          Passed - Valid encounter within last 6 months    Recent Outpatient Visits          2 months ago Annual physical exam   [facility] Reubin Milan, MD   6 months ago Type II diabetes mellitus with complication [facility])   [facility] Reubin Milan, MD   11 months ago Essential hypertension   [facility] Reubin Milan, MD   11 months ago Essential hypertension   [facility] Reubin Milan, MD   1 year ago Annual physical exam   [facility] Reubin Milan, MD      Future Appointments            In 1 month Judithann Graves Nyoka Cowden, MD [facility], [facility]   In 9 months Judithann Graves, Nyoka Cowden, MD [facility], [facility]

## 2021-08-01 ENCOUNTER — Other Ambulatory Visit: Payer: Self-pay | Admitting: Internal Medicine

## 2021-08-01 DIAGNOSIS — I10 Essential (primary) hypertension: Secondary | ICD-10-CM

## 2021-08-06 ENCOUNTER — Other Ambulatory Visit: Payer: Self-pay | Admitting: Internal Medicine

## 2021-08-06 DIAGNOSIS — G8929 Other chronic pain: Secondary | ICD-10-CM

## 2021-08-07 ENCOUNTER — Ambulatory Visit: Payer: Medicare Other | Admitting: Internal Medicine

## 2021-08-07 NOTE — Telephone Encounter (Signed)
Requested medication (s) are due for refill today: yes  Requested medication (s) are on the active medication list: yes  Last refill:  04/05/21  Future visit scheduled: 09/10/21  Notes to clinic:  This medication can not be delegated, please assess.    Requested Prescriptions  Pending Prescriptions Disp Refills   baclofen (LIORESAL) 20 MG tablet [Pharmacy Med Name: BACLOFEN  20MG   TAB] 270 tablet     Sig: TAKE 1 TABLET BY MOUTH 3  TIMES DAILY     Not Delegated - Analgesics:  Muscle Relaxants Failed - 08/06/2021  6:50 PM      Failed - This refill cannot be delegated      Passed - Valid encounter within last 6 months    Recent Outpatient Visits           4 months ago Annual physical exam   [facility], MD   7 months ago Type II diabetes mellitus with complication [facility])   [facility], MD   1 year ago Essential hypertension   [facility] Reubin Milan, MD   1 year ago Essential hypertension   [facility], MD   1 year ago Annual physical exam   [facility], MD       Future Appointments             In 1 month Reubin Milan Judithann Graves, MD [facility]   In 8 months [facility], Judithann Graves, MD [facility], [facility]

## 2021-08-16 ENCOUNTER — Other Ambulatory Visit: Payer: Self-pay | Admitting: Internal Medicine

## 2021-08-16 DIAGNOSIS — E1169 Type 2 diabetes mellitus with other specified complication: Secondary | ICD-10-CM

## 2021-08-17 NOTE — Telephone Encounter (Signed)
Requested Prescriptions  Pending Prescriptions Disp Refills   metFORMIN (GLUCOPHAGE) 1000 MG tablet [Pharmacy Med Name: metFORMIN HCl 1000 MG Oral Tablet] 180 tablet 0    Sig: TAKE 1 TABLET BY MOUTH  TWICE DAILY WITH A MEAL     Endocrinology:  Diabetes - Biguanides Failed - 08/16/2021 11:07 PM      Failed - AA eGFR in normal range and within 360 days    GFR, Est African American  Date Value Ref Range Status  02/10/2018 86 > OR = 60 mL/min/1.20m2 Final   GFR calc Af Amer  Date Value Ref Range Status  07/12/2020 71 >59 mL/min/1.73 Final    Comment:    **In accordance with recommendations from the NKF-ASN Task force,**   [facility] is in the process of updating its eGFR calculation to the   2021 CKD-EPI creatinine equation that estimates kidney function   without a race variable.    GFR, Est Non African American  Date Value Ref Range Status  02/10/2018 74 > OR = 60 mL/min/1.75m2 Final   GFR, Estimated  Date Value Ref Range Status  06/26/2020 >60 >60 mL/min Final    Comment:    (NOTE) Calculated using the CKD-EPI Creatinine Equation (2021)    GFR calc non Af Amer  Date Value Ref Range Status  07/12/2020 62 >59 mL/min/1.73 Final   eGFR  Date Value Ref Range Status  04/05/2021 69 >59 mL/min/1.73 Final         Passed - Cr in normal range and within 360 days    Creat  Date Value Ref Range Status  02/10/2018 1.04 0.70 - 1.25 mg/dL Final    Comment:    For patients >71 years of age, the reference limit for Creatinine is approximately 13% higher for people identified as African-American. .    Creatinine, Ser  Date Value Ref Range Status  04/05/2021 1.14 0.76 - 1.27 mg/dL Final         Passed - HBA1C is between 0 and 7.9 and within 180 days    Hgb A1c MFr Bld  Date Value Ref Range Status  04/05/2021 7.2 (H) 4.8 - 5.6 % Final    Comment:             Prediabetes: 5.7 - 6.4          Diabetes: >6.4          Glycemic control for adults with diabetes: <7.0           Passed - Valid encounter within last 6 months    Recent Outpatient Visits          4 months ago Annual physical exam   [facility] Glean Hess, MD   7 months ago Type II diabetes mellitus with complication [facility])   [facility] Glean Hess, MD   1 year ago Essential hypertension   [facility], MD   1 year ago Essential hypertension   [facility] Glean [facility], MD   1 year ago Annual physical exam   [facility] Glean Hess, MD      Future Appointments            In 3 weeks Army Melia Jesse Sans, MD [facility], [place]   In 7 months Army Melia, Jesse Sans, MD [facility], [facility]

## 2021-09-10 ENCOUNTER — Ambulatory Visit: Payer: Medicare Other | Admitting: Internal Medicine

## 2021-10-22 ENCOUNTER — Ambulatory Visit (INDEPENDENT_AMBULATORY_CARE_PROVIDER_SITE_OTHER): Payer: Medicare Other

## 2021-10-22 DIAGNOSIS — Z0000 Encounter for general adult medical examination without abnormal findings: Secondary | ICD-10-CM

## 2021-10-22 DIAGNOSIS — Z599 Problem related to housing and economic circumstances, unspecified: Secondary | ICD-10-CM | POA: Diagnosis not present

## 2021-10-22 NOTE — Patient Instructions (Signed)
Mr. Bruce Richardson , Thank you for taking time to come for your Medicare Wellness Visit. I appreciate your ongoing commitment to your health goals. Please review the following plan we discussed and let me know if I can assist you in the future.   Screening recommendations/referrals: Colonoscopy: Cologuard done 04/11/20; repeat 03/2023 Recommended yearly ophthalmology/optometry visit for glaucoma screening and checkup Recommended yearly dental visit for hygiene and checkup  Vaccinations: Influenza vaccine: declined Pneumococcal vaccine: declined Tdap vaccine: due Shingles vaccine: Shingrix discussed. Please contact your pharmacy for coverage information.  Covid-19:  done 11/15/19, 04/25/20 & 05/23/20  Advanced directives: Please bring a copy of your health care power of attorney and living will to the office at your convenience.   Conditions/risks identified: Recommend increasing physical activity to at least 3 days per week   Next appointment: Follow up in one year for your annual wellness visit.   Preventive Care 72 Years and Older, Male Preventive care refers to lifestyle choices and visits with your health care provider that can promote health and wellness. What does preventive care include? A yearly physical exam. This is also called an annual well check. Dental exams once or twice a year. Routine eye exams. Ask your health care provider how often you should have your eyes checked. Personal lifestyle choices, including: Daily care of your teeth and gums. Regular physical activity. Eating a healthy diet. Avoiding tobacco and drug use. Limiting alcohol use. Practicing safe sex. Taking low doses of aspirin every day. Taking vitamin and mineral supplements as recommended by your health care provider. What happens during an annual well check? The services and screenings done by your health care provider during your annual well check will depend on your age, overall health, lifestyle risk  factors, and family history of disease. Counseling  Your health care provider may ask you questions about your: Alcohol use. Tobacco use. Drug use. Emotional well-being. Home and relationship well-being. Sexual activity. Eating habits. History of falls. Memory and ability to understand (cognition). Work and work Astronomer. Screening  You may have the following tests or measurements: Height, weight, and BMI. Blood pressure. Lipid and cholesterol levels. These may be checked every 5 years, or more frequently if you are over 18 years old. Skin check. Lung cancer screening. You may have this screening every year starting at age 72 if you have a 30-pack-year history of smoking and currently smoke or have quit within the past 15 years. Fecal occult blood test (FOBT) of the stool. You may have this test every year starting at age 72. Flexible sigmoidoscopy or colonoscopy. You may have a sigmoidoscopy every 5 years or a colonoscopy every 10 years starting at age 72. Prostate cancer screening. Recommendations will vary depending on your family history and other risks. Hepatitis C blood test. Hepatitis B blood test. Sexually transmitted disease (STD) testing. Diabetes screening. This is done by checking your blood sugar (glucose) after you have not eaten for a while (fasting). You may have this done every 1-3 years. Abdominal aortic aneurysm (AAA) screening. You may need this if you are a current or former smoker. Osteoporosis. You may be screened starting at age 72 if you are at high risk. Talk with your health care provider about your test results, treatment options, and if necessary, the need for more tests. Vaccines  Your health care provider may recommend certain vaccines, such as: Influenza vaccine. This is recommended every year. Tetanus, diphtheria, and acellular pertussis (Tdap, Td) vaccine. You may need a Td booster  every 10 years. Zoster vaccine. You may need this after age  72. Pneumococcal 13-valent conjugate (PCV13) vaccine. One dose is recommended after age 72. Pneumococcal polysaccharide (PPSV23) vaccine. One dose is recommended after age 72. Talk to your health care provider about which screenings and vaccines you need and how often you need them. This information is not intended to replace advice given to you by your health care provider. Make sure you discuss any questions you have with your health care provider. Document Released: 09/01/2015 Document Revised: 04/24/2016 Document Reviewed: 06/06/2015 Elsevier Interactive Patient Education  2017 [place] Prevention in the Home Falls can cause injuries. They can happen to people of all ages. There are many things you can do to make your home safe and to help prevent falls. What can I do on the outside of my home? Regularly fix the edges of walkways and driveways and fix any cracks. Remove anything that might make you trip as you walk through a door, such as a raised step or threshold. Trim any bushes or trees on the path to your home. Use bright outdoor lighting. Clear any walking paths of anything that might make someone trip, such as rocks or tools. Regularly check to see if handrails are loose or broken. Make sure that both sides of any steps have handrails. Any raised decks and porches should have guardrails on the edges. Have any leaves, snow, or ice cleared regularly. Use sand or salt on walking paths during winter. Clean up any spills in your garage right away. This includes oil or grease spills. What can I do in the bathroom? Use night lights. Install grab bars by the toilet and in the tub and shower. Do not use towel bars as grab bars. Use non-skid mats or decals in the tub or shower. If you need to sit down in the shower, use a plastic, non-slip stool. Keep the floor dry. Clean up any water that spills on the floor as soon as it happens. Remove soap buildup in the tub or shower  regularly. Attach bath mats securely with double-sided non-slip rug tape. Do not have throw rugs and other things on the floor that can make you trip. What can I do in the bedroom? Use night lights. Make sure that you have a light by your bed that is easy to reach. Do not use any sheets or blankets that are too big for your bed. They should not hang down onto the floor. Have a firm chair that has side arms. You can use this for support while you get dressed. Do not have throw rugs and other things on the floor that can make you trip. What can I do in the kitchen? Clean up any spills right away. Avoid walking on wet floors. Keep items that you use a lot in easy-to-reach places. If you need to reach something above you, use a strong step stool that has a grab bar. Keep electrical cords out of the way. Do not use floor polish or wax that makes floors slippery. If you must use wax, use non-skid floor wax. Do not have throw rugs and other things on the floor that can make you trip. What can I do with my stairs? Do not leave any items on the stairs. Make sure that there are handrails on both sides of the stairs and use them. Fix handrails that are broken or loose. Make sure that handrails are as long as the stairways. Check any carpeting to  make sure that it is firmly attached to the stairs. Fix any carpet that is loose or worn. Avoid having throw rugs at the top or bottom of the stairs. If you do have throw rugs, attach them to the floor with carpet tape. Make sure that you have a light switch at the top of the stairs and the bottom of the stairs. If you do not have them, ask someone to add them for you. What else can I do to help prevent falls? Wear shoes that: Do not have high heels. Have rubber bottoms. Are comfortable and fit you well. Are closed at the toe. Do not wear sandals. If you use a stepladder: Make sure that it is fully opened. Do not climb a closed stepladder. Make sure that  both sides of the stepladder are locked into place. Ask someone to hold it for you, if possible. Clearly mark and make sure that you can see: Any grab bars or handrails. First and last steps. Where the edge of each step is. Use tools that help you move around (mobility aids) if they are needed. These include: Canes. Walkers. Scooters. Crutches. Turn on the lights when you go into a dark area. Replace any light bulbs as soon as they burn out. Set up your furniture so you have a clear path. Avoid moving your furniture around. If any of your floors are uneven, fix them. If there are any pets around you, be aware of where they are. Review your medicines with your doctor. Some medicines can make you feel dizzy. This can increase your chance of falling. Ask your doctor what other things that you can do to help prevent falls. This information is not intended to replace advice given to you by your health care provider. Make sure you discuss any questions you have with your health care provider. Document Released: 06/01/2009 Document Revised: 01/11/2016 Document Reviewed: 09/09/2014 Elsevier Interactive Patient Education  2017 [employer].

## 2021-10-22 NOTE — Progress Notes (Signed)
Subjective:   Bruce Richardson is a 71 y.o. male who presents for Medicare Annual/Subsequent preventive examination.  Virtual Visit via Telephone Note  I connected with  Bruce Richardson on 10/22/21 at  8:00 AM EST by telephone and verified that I am speaking with the correct person using two identifiers.  Location: Patient: home Provider: [facility] Persons participating in the virtual visit: [place]   I discussed the limitations, risks, security and privacy concerns of performing an evaluation and management service by telephone and the availability of in person appointments. The patient expressed understanding and agreed to proceed.  Interactive audio and video telecommunications were attempted between this nurse and patient, however failed, due to patient having technical difficulties OR patient did not have access to video capability.  We continued and completed visit with audio only.  Some vital signs may be absent or patient reported.   Clemetine Marker, LPN   Review of Systems     Cardiac Risk Factors include: advanced age (>43men, >22 women);diabetes mellitus;dyslipidemia;hypertension;obesity (BMI >30kg/m2);male gender     Objective:    There were no vitals filed for this visit. There is no height or weight on file to calculate BMI.  Advanced Directives 10/22/2021 10/18/2020 06/26/2020 10/13/2019 03/17/2019 03/16/2019 09/02/2018  Does Patient Have a Medical Advance Directive? Yes No No No No No No  Type of Paramedic of [place];Living will - - - - - -  Does patient want to make changes to medical advance directive? - - - - - - -  Copy of [place] in Chart? No - copy requested - - - - - -  Would patient like information on creating a medical advance directive? - No - Patient declined - Yes (MAU/Ambulatory/Procedural Areas - Information given) No - Patient declined No - Patient declined No - Patient declined  Some encounter  information is confidential and restricted. Go to Review Flowsheets activity to see all data.    Current Medications (verified) Outpatient Encounter Medications as of 10/22/2021  Medication Sig   albuterol (VENTOLIN HFA) 108 (90 Base) MCG/ACT inhaler USE 2 INHALATIONS BY MOUTH  EVERY 6 HOURS AS NEEDED FOR WHEEZING OR SHORTNESS OF  BREATH   amLODipine (NORVASC) 5 MG tablet TAKE 1 TABLET BY MOUTH  DAILY   aspirin EC 81 MG tablet Take 1 tablet (81 mg total) by mouth daily.   baclofen (LIORESAL) 20 MG tablet TAKE 1 TABLET BY MOUTH 3  TIMES DAILY   Blood Glucose Monitoring Suppl (ONE TOUCH ULTRA MINI) w/Device KIT 1 kit by Does not apply route as directed.   chlorhexidine (PERIDEX) 0.12 % solution SMARTSIG:By Mouth   fluticasone (FLONASE) 50 MCG/ACT nasal spray USE 1 SPRAY IN BOTH  NOSTRILS DAILY (Patient taking differently: as needed.)   glipiZIDE (GLUCOTROL XL) 2.5 MG 24 hr tablet TAKE 1 TABLET BY MOUTH  DAILY WITH BREAKFAST   glucose blood test strip Check blood sugar twice daily. Dx.E11.9   lisinopril (ZESTRIL) 40 MG tablet TAKE 1 TABLET BY MOUTH  DAILY   metFORMIN (GLUCOPHAGE) 1000 MG tablet TAKE 1 TABLET BY MOUTH  TWICE DAILY WITH A MEAL   naproxen (NAPROSYN) 500 MG tablet Take 1 tablet (500 mg total) by mouth 2 (two) times daily with a meal.   ONETOUCH DELICA LANCETS 96P MISC 1 each by Does not apply route 2 (two) times daily. Dx: E11.9   pantoprazole (PROTONIX) 40 MG tablet TAKE 1 TABLET BY MOUTH  DAILY  rosuvastatin (CRESTOR) 40 MG tablet TAKE 1 TABLET BY MOUTH  DAILY   tamsulosin (FLOMAX) 0.4 MG CAPS capsule TAKE 1 CAPSULE BY MOUTH  DAILY FOR PROSTATE   vitamin B-12 (CYANOCOBALAMIN) 1000 MCG tablet Take 1,000 mcg by mouth daily.   meclizine (ANTIVERT) 25 MG tablet Take by mouth. (Patient not taking: Reported on 04/05/2021)   No facility-administered encounter medications on file as of 10/22/2021.    Allergies (verified) Patient has no known allergies.   History: Past Medical History:   Diagnosis Date   Arthritis    Bulging lumbar disc    lower back   Complete rotator cuff rupture of left shoulder 10/25/298   Complication of anesthesia    kept moving while under anesthesia during colonoscopy, hard to put under.   Diabetes mellitus without complication ([place])    GERD (gastroesophageal reflux disease)    Hyperlipidemia    Hypertension    Injury of tendon of upper extremity 12/23/2014   Non-retracting foreskin 07/04/2015   Stroke ([place])    Tendinitis of elbow or forearm 04/26/2015   Past Surgical History:  Procedure Laterality Date   CIRCUMCISION N/A 07/24/2015   Procedure: CIRCUMCISION ADULT;  Surgeon: Nickie Retort, MD;  Location: [facility] ORS;  Service: Urology;  Laterality: N/A;   EYE SURGERY Right    right eye cataract surgery    SHOULDER ARTHROSCOPY Bilateral    SHOULDER ARTHROSCOPY Right 12/22/2014   Procedure: RIGHT SHOULDER ARTHROSCOPY /DECOMPRESSION/ROTATOR CUFF REPAIR OF RECURRENT ROTATOR CUFF TEAR;  Surgeon: Corky Mull, MD;  Location: [facility] ORS;  Service: Orthopedics;  Laterality: Right;   Family History  Problem Relation Age of Onset   Alzheimer's disease Mother    Diabetes Mother    Hypertension Mother    Cancer Father    Heart failure Brother    Diabetes Brother    Hypertension Brother    Heart disease Brother    Prostate cancer Other        unknown   Bladder Cancer Other        unknown   Heart disease Son    Diabetes Sister    Social History   Socioeconomic History   Marital status: Married    Spouse name: Not on file   Number of children: Not on file   Years of education: Not on file   Highest education level: Not on file  Occupational History   Occupation: retired  Tobacco Use   Smoking status: Former    Packs/day: 1.50    Years: 25.00    Pack years: 37.50    Types: Cigarettes    Start date: 12/19/2001    Quit date: 07/03/2002    Years since quitting: 19.3   Smokeless tobacco: Former   Tobacco comments:    quit smoking in  06/2001  Vaping Use   Vaping Use: Never used  Substance and Sexual Activity   Alcohol use: No    Alcohol/week: 0.0 standard drinks   Drug use: No   Sexual activity: Yes    Partners: Female  Other Topics Concern   Not on file  Social History Narrative   Not on file   Social Determinants of Health   Financial Resource Strain: Medium Risk   Difficulty of Paying Living Expenses: Somewhat hard  Food Insecurity: No Food Insecurity   Worried About Charity fundraiser in the Last Year: Never true   [place] in the Last Year: Never true  Transportation Needs: No Transportation Needs  Lack of Transportation (Medical): No   Lack of Transportation (Non-Medical): No  Physical Activity: Inactive   Days of Exercise per Week: 0 days   Minutes of Exercise per Session: 0 min  Stress: No Stress Concern Present   Feeling of Stress : Not at all  Social Connections: Moderately Isolated   Frequency of Communication with Friends and Family: More than three times a week   Frequency of Social Gatherings with Friends and Family: Three times a week   Attends Religious Services: Never   Active Member of Clubs or Organizations: No   Attends Archivist Meetings: Never   Marital Status: Married    Tobacco Counseling Counseling given: Not Answered Tobacco comments: quit smoking in 06/2001   Clinical Intake:  Pre-visit preparation completed: Yes  Pain : No/denies pain     Nutritional Risks: None Diabetes: Yes CBG done?: No Did pt. bring in CBG monitor from home?: No  How often do you need to have someone help you when you read instructions, pamphlets, or other written materials from your doctor or pharmacy?: 1 - Never  Nutrition Risk Assessment:  Has the patient had any N/V/D within the last 2 months?  No  Does the patient have any non-healing wounds?  No  Has the patient had any unintentional weight loss or weight gain?  No   Diabetes:  Is the patient diabetic?  Yes   If diabetic, was a CBG obtained today?  No  Did the patient bring in their glucometer from home?  No  How often do you monitor your CBG's? 3 times per week.   Financial Strains and Diabetes Management:  Are you having any financial strains with the device, your supplies or your medication? No .  Does the patient want to be seen by Chronic Care Management for management of their diabetes?  No  Would the patient like to be referred to a Nutritionist or for Diabetic Management?  No   Diabetic Exams:  Diabetic Eye Exam: Completed 02/06/21.   Diabetic Foot Exam: Completed 04/05/21.   Interpreter Needed?: No  Information entered by :: Clemetine Marker LPN   Activities of Daily Living In your present state of health, do you have any difficulty performing the following activities: 10/22/2021 04/05/2021  Hearing? N N  Vision? N N  Difficulty concentrating or making decisions? N N  Walking or climbing stairs? N N  Dressing or bathing? N N  Doing errands, shopping? N N  Preparing Food and eating ? N -  Using the Toilet? N -  In the past six months, have you accidently leaked urine? N -  Do you have problems with loss of bowel control? N -  Managing your Medications? N -  Managing your Finances? N -  Housekeeping or managing your Housekeeping? N -  Some recent data might be hidden    Patient Care Team: Glean Hess, MD as PCP - General (Internal Medicine) Anell Barr, OD (Optometry)  Indicate any recent Medical Services you may have received from other than [facility] providers in the past year (date may be approximate).     Assessment:   This is a routine wellness examination for [place].  Hearing/Vision screen Hearing Screening - Comments:: Pt c/o occasional hearing difficulty; declines hearing aids  Vision Screening - Comments:: Annual vision screenings with Dr. Ellin Mayhew  Dietary issues and exercise activities discussed: Current Exercise Habits: The patient does not participate  in regular exercise at present, Exercise limited by: None  identified   Goals Addressed             This Visit's Progress    DIET - INCREASE WATER INTAKE   On track    Recommend continue drinking at least 6-8 glasses of water a day      Exercise   Not on track    Recommend physical activity at least 3 times per week        Depression Screen PHQ 2/9 Scores 10/22/2021 04/05/2021 12/25/2020 10/18/2020 08/07/2020 07/12/2020 04/03/2020  PHQ - 2 Score 6 0 3 3 0 0 2  PHQ- 9 Score $Remov'8 8 16 9 'GXfOdU$ 0 0 4  Exception Documentation - - - - - - -    Fall Risk Fall Risk  10/22/2021 04/05/2021 12/25/2020 10/18/2020 08/07/2020  Falls in the past year? 0 0 $R'1 1 1  'ya$ Number falls in past yr: 0 0 1 0 1  Injury with Fall? 1 0 1 0 0  Risk for fall due to : History of fall(s) History of fall(s) History of fall(s) Impaired balance/gait -  Follow up Falls prevention discussed - Falls evaluation completed Falls prevention discussed Falls evaluation completed    FALL RISK PREVENTION PERTAINING TO THE HOME:  Any stairs in or around the home? Yes  If so, are there any without handrails? No  Home free of loose throw rugs in walkways, pet beds, electrical cords, etc? Yes  Adequate lighting in your home to reduce risk of falls? Yes   ASSISTIVE DEVICES UTILIZED TO PREVENT FALLS:  Life alert? No  Use of a cane, walker or w/c? No  Grab bars in the bathroom? Yes  Shower chair or bench in shower? Yes  Elevated toilet seat or a handicapped toilet? Yes   TIMED UP AND GO:  Was the test performed? No . Telephonic visit.   Cognitive Function: MMSE - Mini Mental State Exam 04/12/2015  Orientation to time 5  Orientation to Place 5  Registration 3  Attention/ Calculation 5  Recall 3  Language- name 2 objects 2  Language- repeat 1  Language- follow 3 step command 3  Language- read & follow direction 0  Write a sentence 0  Copy design 0  Total score 27     6CIT Screen 10/13/2019 02/10/2018 02/04/2017  What Year? 0 points 0  points 0 points  What month? 0 points 0 points 0 points  What time? 0 points 0 points 0 points  Count back from 20 0 points 0 points 0 points  Months in reverse 0 points 0 points 0 points  Repeat phrase 0 points 0 points 0 points  Total Score 0 0 0    Immunizations Immunization History  Administered Date(s) Administered   Moderna Sars-Covid-2 Vaccination 11/15/2019, 04/25/2020, 05/23/2020    TDAP status: Due, Education has been provided regarding the importance of this vaccine. Advised may receive this vaccine at local pharmacy or Health Dept. Aware to provide a copy of the vaccination record if obtained from local pharmacy or Health Dept. Verbalized acceptance and understanding.  Flu Vaccine status: Declined, Education has been provided regarding the importance of this vaccine but patient still declined. Advised may receive this vaccine at local pharmacy or Health Dept. Aware to provide a copy of the vaccination record if obtained from local pharmacy or Health Dept. Verbalized acceptance and understanding.  Pneumococcal vaccine status: Declined,  Education has been provided regarding the importance of this vaccine but patient still declined. Advised may receive this vaccine at local  pharmacy or Health Dept. Aware to provide a copy of the vaccination record if obtained from local pharmacy or Health Dept. Verbalized acceptance and understanding.   Covid-19 vaccine status: Completed vaccines  Qualifies for Shingles Vaccine? Yes   Zostavax completed No   Shingrix Completed?: No.    Education has been provided regarding the importance of this vaccine. Patient has been advised to call insurance company to determine out of pocket expense if they have not yet received this vaccine. Advised may also receive vaccine at local pharmacy or Health Dept. Verbalized acceptance and understanding.  Screening Tests Health Maintenance  Topic Date Due   COVID-19 Vaccine (4 - Booster for Moderna series)  07/18/2020   HEMOGLOBIN A1C  10/06/2021   INFLUENZA VACCINE  11/16/2021 (Originally 03/19/2021)   Pneumonia Vaccine 69+ Years old (1 - PCV) 10/23/2022 (Originally 02/16/1957)   Zoster Vaccines- Shingrix (1 of 2) 04/05/2026 (Originally 02/16/2000)   OPHTHALMOLOGY EXAM  02/06/2022   FOOT EXAM  04/05/2022   Fecal DNA (Cologuard)  04/12/2023   Hepatitis C Screening  Completed   HPV VACCINES  Aged Out   TETANUS/TDAP  Discontinued    Health Maintenance  Health Maintenance Due  Topic Date Due   COVID-19 Vaccine (4 - Booster for Moderna series) 07/18/2020   HEMOGLOBIN A1C  10/06/2021    Colorectal cancer screening: Type of screening: Cologuard. Completed 04/11/20. Repeat every 3 years  Lung Cancer Screening: (Low Dose CT Chest recommended if Age 17-80 years, 30 pack-year currently smoking OR have quit w/in 15years.) does not qualify.  Additional Screening:  Hepatitis C Screening: does qualify; Completed 02/25/17  Vision Screening: Recommended annual ophthalmology exams for early detection of glaucoma and other disorders of the eye. Is the patient up to date with their annual eye exam?  Yes  Who is the provider or what is the name of the office in which the patient attends annual eye exams? Dr. Ellin Mayhew.   Dental Screening: Recommended annual dental exams for proper oral hygiene  Community Resource Referral / Chronic Care Management: CRR required this visit?  Yes   CCM required this visit?  No      Plan:     I have personally reviewed and noted the following in the patients chart:   Medical and social history Use of alcohol, tobacco or illicit drugs  Current medications and supplements including opioid prescriptions. Patient is not currently taking opioid prescriptions. Functional ability and status Nutritional status Physical activity Advanced directives List of other physicians Hospitalizations, surgeries, and ER visits in previous 12 months Vitals Screenings to include  cognitive, depression, and falls Referrals and appointments  In addition, I have reviewed and discussed with patient certain preventive protocols, quality metrics, and best practice recommendations. A written personalized care plan for preventive services as well as general preventive health recommendations were provided to patient.     Clemetine Marker, LPN   01/21/9934   Nurse Notes: pt advised needs OV follow up; scheduled for 11/14/21.

## 2021-10-23 ENCOUNTER — Telehealth: Payer: Self-pay | Admitting: *Deleted

## 2021-10-23 NOTE — Telephone Encounter (Signed)
? ?  Telephone encounter was:  Unsuccessful.  10/23/2021 ?Name: Bruce Richardson MRN: [id number] DOB: 12/16/1950 ? ?Unsuccessful outbound call made today to assist with:  Home Modifications ? ?Outreach Attempt:  1st Attempt ? ?Could not leave voicemail asked for an access code  ? ?[name] ?Care Guide , Embedded Care Coordination ?[place], Care Management  ?[phone] ?[address] [place] , [place] [address] ?Email : [email or web address] ?  ? ?

## 2021-10-25 ENCOUNTER — Telehealth: Payer: Self-pay | Admitting: *Deleted

## 2021-10-25 NOTE — Telephone Encounter (Signed)
? ?  Telephone encounter was:  Successful.  ?10/25/2021 ?Name: YADEL MILLERICK MRN: [id number] DOB: Jul 05, 1951 ? ?ETHANJACOB POLIS is a 71 y.o. year old male who is a primary care patient of Army Melia Jesse Sans, MD . The community resource team was consulted for assistance with  shower ? ?Care guide performed the following interventions: Patient provided with information about care guide support team and interviewed to confirm resource needs.Shower needed will send information on various house rehab programs did not want to entere [place] [address] . ? ?Follow Up Plan:  Care guide will follow up with patient by phone over the next week ?Reichen Hutzler Greenauer -Selinda Eon ?Care Guide , Embedded Care Coordination ?[place], Care Management  ?[phone] ?[address] , [place] [address] ?Email : [email or web address] ?  ? ?

## 2021-10-27 ENCOUNTER — Other Ambulatory Visit: Payer: Self-pay | Admitting: Internal Medicine

## 2021-10-27 DIAGNOSIS — I10 Essential (primary) hypertension: Secondary | ICD-10-CM

## 2021-10-29 NOTE — Telephone Encounter (Signed)
Requested Prescriptions  ?Pending Prescriptions Disp Refills  ?? lisinopril (ZESTRIL) 40 MG tablet [Pharmacy Med Name: Lisinopril 40 MG Oral Tablet] 90 tablet 0  ?  Sig: TAKE 1 TABLET BY MOUTH  DAILY  ?  ? Cardiovascular:  ACE Inhibitors Failed - 10/27/2021 11:02 PM  ?  ?  Failed - Cr in normal range and within 180 days  ?  Creat  ?Date Value Ref Range Status  ?02/10/2018 1.04 0.70 - 1.25 mg/dL Final  ?  Comment:  ?  For patients >72 years of age, the reference limit ?for Creatinine is approximately 13% higher for people ?identified as African-American. ?. ?  ? ?Creatinine, Ser  ?Date Value Ref Range Status  ?04/05/2021 1.14 0.76 - 1.27 mg/dL Final  ?   ?  ?  Failed - K in normal range and within 180 days  ?  Potassium  ?Date Value Ref Range Status  ?04/05/2021 4.7 3.5 - 5.2 mmol/L Final  ?06/08/2014 4.2 3.5 - 5.1 mmol/L Final  ?   ?  ?  Failed - Valid encounter within last 6 months  ?  Recent Outpatient Visits   ?      ? 6 months ago Annual physical exam  ? [facility] Reubin Milan, MD  ? 10 months ago Type II diabetes mellitus with complication [facility])  ? [facility] Reubin Milan, MD  ? 1 year ago Essential hypertension  ? [facility] Reubin Milan, MD  ? 1 year ago Essential hypertension  ? [facility] Reubin Milan, MD  ? 1 year ago Annual physical exam  ? [facility] Reubin Milan, MD  ?  ?  ?Future Appointments   ?        ? In 2 weeks Judithann Graves Nyoka Cowden, MD [facility]  ? In 5 months Judithann Graves Nyoka Cowden, MD [facility]  ?  ? ?  ?  ?  Passed - Patient is not pregnant  ?  ?  Passed - Last BP in normal range  ?  BP Readings from Last 1 Encounters:  ?04/05/21 136/80  ?   ?  ?  ?? amLODipine (NORVASC) 5 MG tablet [Pharmacy Med Name: amLODIPine Besylate 5 MG Oral Tablet] 90 tablet 0  ?  Sig: TAKE 1 TABLET BY MOUTH DAILY  ?  ? Cardiovascular: Calcium Channel Blockers 2 Failed - 10/27/2021 11:02 PM  ?  ?  Failed - Valid  encounter within last 6 months  ?  Recent Outpatient Visits   ?      ? 6 months ago Annual physical exam  ? [facility] Reubin Milan, MD  ? 10 months ago Type II diabetes mellitus with complication [facility])  ? [facility] Reubin Milan, MD  ? 1 year ago Essential hypertension  ?  [facility] Reubin Milan, MD  ? 1 year ago Essential hypertension  ? [facility] Reubin Milan, MD  ? 1 year ago Annual physical exam  ? [facility] Reubin Milan, MD  ?  ?  ?Future Appointments   ?        ? In 2 weeks Judithann Graves Nyoka Cowden, MD [facility], [facility]  ? In 5 months Reubin Milan, MD [facility]  ?  ? ?  ?  ?  Passed - Last BP in normal range  ?  BP Readings from  Last 1 Encounters:  ?04/05/21 136/80  ?   ?  ?  Passed - Last Heart Rate in normal range  ?  Pulse Readings from Last 1 Encounters:  ?04/05/21 95  ?   ?  ?  ? ?

## 2021-10-31 ENCOUNTER — Telehealth: Payer: Self-pay | Admitting: *Deleted

## 2021-10-31 NOTE — Telephone Encounter (Signed)
? ?  Telephone encounter was:  Successful.  ?10/31/2021 ?Name: Bruce Richardson MRN: [id number] DOB: 1951/01/04 ? ?Bruce Richardson is a 71 y.o. year old male who is a primary care patient of Judithann Graves Nyoka Cowden, MD . The community resource team was consulted for assistance with  shower repair ? ?Care guide performed the following interventions: Patient provided with information about care guide support team and interviewed to confirm resource needs. ? ?Follow Up Plan:  Care guide will follow up with patient by phone over the next days ? ?Arad Burston Greenauer -Berneda Rose ?Care Guide , Embedded Care Coordination ?[place], Care Management  ?[phone] ?[address] [place] , [place] [address] ?Email : [email or web address] ?  ?

## 2021-11-06 ENCOUNTER — Other Ambulatory Visit: Payer: Self-pay | Admitting: Internal Medicine

## 2021-11-06 DIAGNOSIS — E1169 Type 2 diabetes mellitus with other specified complication: Secondary | ICD-10-CM

## 2021-11-07 ENCOUNTER — Telehealth: Payer: Self-pay | Admitting: *Deleted

## 2021-11-07 NOTE — Telephone Encounter (Signed)
? ?  Telephone encounter was:  Successful.  ?11/07/2021 ?Name: Bruce Richardson MRN: [id number] DOB: April 10, 1951 ? ?Bruce Richardson is a 71 y.o. year old male who is a primary care patient of Judithann Graves Nyoka Cowden, MD . The community resource team was consulted for assistance with  Shower ? ?Care guide performed the following interventions: Patient received the information. ? ?Follow Up Plan:  No further follow up planned at this time. The patient has been provided with needed resources. ?[name] ?Care Guide , Embedded Care Coordination ?[place], Care Management  ?[phone] ?[address] [place] , [place] [address] ?Email : [email or web address] ?  ?

## 2021-11-08 NOTE — Telephone Encounter (Signed)
Requested medication (s) are due for refill today:   Yes ? ?Requested medication (s) are on the active medication list:   Yes ? ?Future visit scheduled:   Yes on 11/14/2021 ? ? ?Last ordered: 08/17/2021 #180, 0 refills ? ?Protocol criteria failed due to labs out of date    ? ?Requested Prescriptions  ?Pending Prescriptions Disp Refills  ? metFORMIN (GLUCOPHAGE) 1000 MG tablet [Pharmacy Med Name: metFORMIN HCl 1000 MG Oral Tablet] 180 tablet 3  ?  Sig: TAKE 1 TABLET BY MOUTH  TWICE DAILY WITH A MEAL  ?  ? Endocrinology:  Diabetes - Biguanides Failed - 11/06/2021 10:11 PM  ?  ?  Failed - HBA1C is between 0 and 7.9 and within 180 days  ?  Hgb A1c MFr Bld  ?Date Value Ref Range Status  ?04/05/2021 7.2 (H) 4.8 - 5.6 % Final  ?  Comment:  ?           Prediabetes: 5.7 - 6.4 ?         Diabetes: >6.4 ?         Glycemic control for adults with diabetes: <7.0 ?  ?  ?  ?  ?  Failed - B12 Level in normal range and within 720 days  ?  No results found for: VITAMINB12  ?  ?  ?  Failed - Valid encounter within last 6 months  ?  Recent Outpatient Visits   ? ?      ? 7 months ago Annual physical exam  ? [facility] Berglund, Laura H, MD  ? 10 months ago Type II diabetes mellitus with complication (HCC)  ? [facility] Berglund, Laura H, MD  ? 1 year ago Essential hypertension  ? [facility] Berglund, Laura H, MD  ? 1 year ago Essential hypertension  ? [facility] Berglund, Laura H, MD  ? 1 year ago Annual physical exam  ? [facility] Berglund, Laura H, MD  ? ?  ?  ?Future Appointments   ? ?        ? In 6 days Berglund, Laura H, MD [facility]  ? In 5 months Berglund, Laura H, MD [facility]  ? ?  ? ?  ?  ?  Passed - Cr in normal range and within 360 days  ?  Creat  ?Date Value Ref Range Status  ?02/10/2018 1.04 0.70 - 1.25 mg/dL Final  ?  Comment:  ?  For patients >49 years of age, the reference limit ?for Creatinine is approximately 13% higher for  people ?identified as African-American. ?. ?  ? ?Creatinine, Ser  ?Date Value Ref Range Status  ?04/05/2021 1.14 0.76 - 1.27 mg/dL Final  ?  ?  ?  ?  Passed - eGFR in normal range and within 360 days  ?  GFR, Est African American  ?Date Value Ref Range Status  ?02/10/2018 86 > OR = 60 mL/min/1.73m2 Final  ? ?GFR calc Af Amer  ?Date Value Ref Range Status  ?07/12/2020 71 >59 mL/min/1.73 Final  ?  Comment:  ?  **In accordance with recommendations from the NKF-ASN Task force,** ?  [facility] is in the process of updating its eGFR calculation to the ?  2021 CKD-EPI creatinine equation that estimates kidney function ?  without a race variable. ?  ? ?GFR, Est Non African American  ?Date Value Ref Range Status  ?02/10/2018 74 > OR = 60 mL/min/1.73m2 Final  ? ?  GFR, Estimated  ?Date Value Ref Range Status  ?06/26/2020 >60 >60 mL/min Final  ?  Comment:  ?  (NOTE) ?Calculated using the CKD-EPI Creatinine Equation (2021) ?  ? ?GFR calc non Af Amer  ?Date Value Ref Range Status  ?07/12/2020 62 >59 mL/min/1.73 Final  ? ?eGFR  ?Date Value Ref Range Status  ?04/05/2021 69 >59 mL/min/1.73 Final  ?  ?  ?  ?  Passed - CBC within normal limits and completed in the last 12 months  ?  WBC  ?Date Value Ref Range Status  ?04/05/2021 7.2 3.4 - 10.8 x10E3/uL Final  ?06/26/2020 7.4 4.0 - 10.5 K/uL Final  ? ?RBC  ?Date Value Ref Range Status  ?04/05/2021 5.30 4.14 - 5.80 x10E6/uL Final  ?06/26/2020 5.42 4.22 - 5.81 MIL/uL Final  ? ?Hemoglobin  ?Date Value Ref Range Status  ?04/05/2021 15.0 13.0 - 17.7 g/dL Final  ? ?Hematocrit  ?Date Value Ref Range Status  ?04/05/2021 45.4 37.5 - 51.0 % Final  ? ?MCHC  ?Date Value Ref Range Status  ?04/05/2021 33.0 31.5 - 35.7 g/dL Final  ?06/26/2020 33.1 30.0 - 36.0 g/dL Final  ? ?MCH  ?Date Value Ref Range Status  ?04/05/2021 28.3 26.6 - 33.0 pg Final  ?06/26/2020 28.8 26.0 - 34.0 pg Final  ? ?MCV  ?Date Value Ref Range Status  ?04/05/2021 86 79 - 97 fL Final  ?12/29/2012 85 80 - 100 fL Final  ? ?No results  found for: PLTCOUNTKUC, LABPLAT, POCPLA ?RDW  ?Date Value Ref Range Status  ?04/05/2021 12.3 11.6 - 15.4 % Final  ?12/29/2012 12.6 11.5 - 14.5 % Final  ? ?  ?  ?  ? ?

## 2021-11-14 ENCOUNTER — Ambulatory Visit (INDEPENDENT_AMBULATORY_CARE_PROVIDER_SITE_OTHER): Payer: Medicare Other | Admitting: Internal Medicine

## 2021-11-14 ENCOUNTER — Encounter: Payer: Self-pay | Admitting: Internal Medicine

## 2021-11-14 ENCOUNTER — Other Ambulatory Visit: Payer: Self-pay

## 2021-11-14 VITALS — BP 138/78 | HR 85 | Ht 70.0 in | Wt 233.0 lb

## 2021-11-14 DIAGNOSIS — L309 Dermatitis, unspecified: Secondary | ICD-10-CM | POA: Diagnosis not present

## 2021-11-14 DIAGNOSIS — I700 Atherosclerosis of aorta: Secondary | ICD-10-CM

## 2021-11-14 DIAGNOSIS — I10 Essential (primary) hypertension: Secondary | ICD-10-CM | POA: Diagnosis not present

## 2021-11-14 DIAGNOSIS — M7072 Other bursitis of hip, left hip: Secondary | ICD-10-CM | POA: Diagnosis not present

## 2021-11-14 DIAGNOSIS — J432 Centrilobular emphysema: Secondary | ICD-10-CM | POA: Diagnosis not present

## 2021-11-14 DIAGNOSIS — E118 Type 2 diabetes mellitus with unspecified complications: Secondary | ICD-10-CM

## 2021-11-14 DIAGNOSIS — F331 Major depressive disorder, recurrent, moderate: Secondary | ICD-10-CM

## 2021-11-14 MED ORDER — TRIAMCINOLONE ACETONIDE 0.5 % EX CREA: 1.0000 "application " | TOPICAL_CREAM | Freq: Three times a day (TID) | CUTANEOUS | 1 refills | Status: DC

## 2021-11-14 MED ORDER — GLIPIZIDE ER 2.5 MG PO TB24: 2.5000 mg | ORAL_TABLET | Freq: Every day | ORAL | 1 refills | Status: DC

## 2021-11-14 MED ORDER — NAPROXEN 500 MG PO TABS: 500.0000 mg | ORAL_TABLET | Freq: Two times a day (BID) | ORAL | 0 refills | Status: DC

## 2021-11-14 NOTE — Progress Notes (Signed)
? ? ?Date:  11/14/2021  ? ?Name:  Bruce Richardson   DOB:  [birth year]   MRN:  [id number] ? ? ?Chief Complaint: Diabetes and Hypertension ? ?Hypertension ?This is a chronic problem. The problem is controlled. Pertinent negatives include no chest pain, headaches, palpitations or shortness of breath. Past treatments include beta blockers and calcium channel blockers. The current treatment provides significant improvement.  ?Diabetes ?He presents for his follow-up diabetic visit. He has type 2 diabetes mellitus. His disease course has been stable. Pertinent negatives for hypoglycemia include no headaches or tremors. Pertinent negatives for diabetes include no chest pain, no fatigue, no polydipsia and no polyuria. Current diabetic treatment includes oral agent (dual therapy) (metformin and glipizide).  ?Hip Pain  ?There was no injury mechanism. The pain is present in the left hip. The quality of the pain is described as aching and burning. The pain is moderate. The pain has been Constant since onset. Associated symptoms include tingling. Pertinent negatives include no numbness. The symptoms are aggravated by palpation (and sleeping on the left side).  ?Rash ?This is a new problem. The current episode started more than 1 month ago. The problem is unchanged. Location: palms, ears. The rash is characterized by dryness, scaling and itchiness. Associated with: he is concerned that it is from metformin. Pertinent negatives include no cough, fatigue or shortness of breath.  ?COPD - he uses albuterol about 2 times a day.  He does not think he ever tried a maintenance inhaler.  He does not want to try one at this time.  I advise using albuterol before he goes to walk. ? ?Lab Results  ?Component Value Date  ? NA 138 04/05/2021  ? K 4.7 04/05/2021  ? CO2 24 04/05/2021  ? GLUCOSE 184 (H) 04/05/2021  ? BUN 15 04/05/2021  ? CREATININE 1.14 04/05/2021  ? CALCIUM 9.9 04/05/2021  ? EGFR 69 04/05/2021  ? GFRNONAA 62 07/12/2020  ? ?Lab Results   ?Component Value Date  ? CHOL 147 04/05/2021  ? HDL 39 (L) 04/05/2021  ? [place] 90 04/05/2021  ? TRIG 94 04/05/2021  ? CHOLHDL 3.8 04/05/2021  ? ?Lab Results  ?Component Value Date  ? TSH 2.879 04/03/2020  ? ?Lab Results  ?Component Value Date  ? HGBA1C 7.2 (H) 04/05/2021  ? ?Lab Results  ?Component Value Date  ? WBC 7.2 04/05/2021  ? HGB 15.0 04/05/2021  ? HCT 45.4 04/05/2021  ? MCV 86 04/05/2021  ? PLT 278 04/05/2021  ? ?Lab Results  ?Component Value Date  ? ALT 15 04/05/2021  ? AST 11 04/05/2021  ? ALKPHOS 81 04/05/2021  ? BILITOT 0.7 04/05/2021  ? ?No results found for: 25OHVITD2, [place], VD25OH  ? ?Review of Systems  ?Constitutional:  Negative for appetite change, fatigue and unexpected weight change.  ?Eyes:  Negative for visual disturbance.  ?Respiratory:  Negative for cough, shortness of breath and wheezing.   ?Cardiovascular:  Negative for chest pain, palpitations and leg swelling.  ?Gastrointestinal:  Negative for abdominal pain and blood in stool.  ?Endocrine: Negative for polydipsia and polyuria.  ?Genitourinary:  Negative for dysuria and hematuria.  ?Skin:  Positive for rash. Negative for color change.  ?Neurological:  Positive for tingling. Negative for tremors, numbness and headaches.  ?Psychiatric/Behavioral:  Negative for dysphoric mood.   ? ?Patient Active Problem List  ? Diagnosis Date Noted  ? BPPV (benign paroxysmal positional vertigo), unspecified laterality 12/24/2020  ? Chronic diastolic congestive heart failure ([place]) 11/29/2019  ? Centrilobular emphysema (  [place]) 01/26/2019  ? Aortic atherosclerosis ([place]) 01/26/2019  ? Echocardiogram shows left ventricular diastolic dysfunction 16/60/6301  ? Moderate episode of recurrent major depressive disorder ([place]) 02/16/2018  ? Erectile dysfunction 02/16/2018  ? History of CVA (cerebrovascular accident) without residual deficits 08/13/2017  ? BPH without obstruction/lower urinary tract symptoms 03/19/2017  ? Chronic bilateral low back pain 11/14/2016   ? 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 GERD (gastroesophageal reflux disease) 04/11/2016  ? Obesity 04/11/2016  ? Allergic rhinitis 08/04/2015  ? Essential hypertension 07/04/2015  ? Hyperlipidemia due to type 2 diabetes mellitus ([place]) 07/04/2015  ? Type II diabetes mellitus with complication ([place]) 60/05/9322  ? History of repair of rotator cuff 08/31/2014  ? ? ?No Known Allergies ? ?Past Surgical History:  ?Procedure Laterality Date  ? CIRCUMCISION N/A 07/24/2015  ? Procedure: CIRCUMCISION ADULT;  Surgeon: Nickie Retort, MD;  Location: [facility] ORS;  Service: Urology;  Laterality: N/A;  ? EYE SURGERY Right   ? right eye cataract surgery   ? SHOULDER ARTHROSCOPY Bilateral   ? SHOULDER ARTHROSCOPY Right 12/22/2014  ? Procedure: RIGHT SHOULDER ARTHROSCOPY /DECOMPRESSION/ROTATOR CUFF REPAIR OF RECURRENT ROTATOR CUFF TEAR;  Surgeon: Corky Mull, MD;  Location: [facility] ORS;  Service: Orthopedics;  Laterality: Right;  ? ? ?Social History  ? ?Tobacco Use  ? Smoking status: Former  ?  Packs/day: 1.50  ?  Years: 25.00  ?  Pack years: 37.50  ?  Types: Cigarettes  ?  Start date: 12/19/2001  ?  Quit date: 07/03/2002  ?  Years since quitting: 19.3  ? Smokeless tobacco: Former  ? Tobacco comments:  ?  quit smoking in 06/2001  ?Vaping Use  ? Vaping Use: Never used  ?Substance Use Topics  ? Alcohol use: No  ?  Alcohol/week: 0.0 standard drinks  ? Drug use: No  ? ? ? ?Medication list has been reviewed and updated. ? ?Current Meds  ?Medication Sig  ? albuterol (VENTOLIN HFA) 108 (90 Base) MCG/ACT inhaler USE 2 INHALATIONS BY MOUTH  EVERY 6 HOURS AS NEEDED FOR WHEEZING OR SHORTNESS OF  BREATH  ? amLODipine (NORVASC) 5 MG tablet TAKE 1 TABLET BY MOUTH DAILY  ? aspirin EC 81 MG tablet Take 1 tablet (81 mg total) by mouth daily.  ? baclofen (LIORESAL) 20 MG tablet TAKE 1 TABLET BY MOUTH 3  TIMES DAILY  ? Blood Glucose Monitoring Suppl (ONE TOUCH ULTRA MINI) w/Device KIT 1 kit by Does not apply route as directed.  ?  chlorhexidine (PERIDEX) 0.12 % solution SMARTSIG:By Mouth  ? fluticasone (FLONASE) 50 MCG/ACT nasal spray USE 1 SPRAY IN BOTH  NOSTRILS DAILY (Patient taking differently: as needed.)  ? glipiZIDE (GLUCOTROL XL) 2.5 MG 24 hr tablet TAKE 1 TABLET BY MOUTH  DAILY WITH BREAKFAST  ? glucose blood test strip Check blood sugar twice daily. Dx.E11.9  ? lisinopril (ZESTRIL) 40 MG tablet TAKE 1 TABLET BY MOUTH  DAILY  ? metFORMIN (GLUCOPHAGE) 1000 MG tablet TAKE 1 TABLET BY MOUTH TWICE  DAILY WITH A MEAL  ? naproxen (NAPROSYN) 500 MG tablet Take 1 tablet (500 mg total) by mouth 2 (two) times daily with a meal. (Patient taking differently: Take 500 mg by mouth as needed.)  ? ONETOUCH DELICA LANCETS 55D MISC 1 each by Does not apply route 2 (two) times daily. Dx: E11.9  ? pantoprazole (PROTONIX) 40 MG tablet TAKE 1 TABLET BY MOUTH  DAILY  ? rosuvastatin (CRESTOR) 40 MG tablet TAKE 1 TABLET BY  MOUTH  DAILY  ? tamsulosin (FLOMAX) 0.4 MG CAPS capsule TAKE 1 CAPSULE BY MOUTH  DAILY FOR PROSTATE  ? vitamin B-12 (CYANOCOBALAMIN) 1000 MCG tablet Take 1,000 mcg by mouth daily.  ? ? ? ?  11/14/2021  ?  9:44 AM 04/05/2021  ?  8:32 AM 12/25/2020  ?  8:35 AM 08/07/2020  ?  9:20 AM  ?GAD 7 : Generalized Anxiety Score  ?Nervous, Anxious, on Edge 1 0 2 0  ?Control/stop worrying 0 3 3 0  ?Worry too much - different things _0 0  ?Trouble relaxing _1 0  ?Restless _2 0  ?Easily annoyed or irritable _3 0  ?Afraid - awful might happen 0 0 2 0  ?Total GAD 7 Score _4 0  ?Anxiety Difficulty   Extremely difficult Not difficult at all  ? ? ? ?  11/14/2021  ?  9:42 AM  ?Depression screen PHQ 2/9  ?Decreased Interest 1  ?Down, Depressed, Hopeless 2  ?PHQ - 2 Score 3  ?Altered sleeping 3  ?Tired, decreased energy 2  ?Change in appetite 2  ?Feeling bad or failure about yourself  1  ?Trouble concentrating 1  ?Moving slowly or fidgety/restless 0  ?Suicidal thoughts 0  ?PHQ-9 Score 12  ? ? ?BP Readings from Last 3 Encounters:  ?11/14/21 138/78   ?04/05/21 136/80  ?12/25/20 138/86  ? ? ?Physical Exam ?Vitals and nursing note reviewed.  ?Constitutional:   ?   General: He is not in acute distress. ?   Appearance: He is well-developed.  ?HENT:  ?   H

## 2021-11-15 LAB — MICROALBUMIN / CREATININE URINE RATIO
Creatinine, Urine: 239.4 mg/dL
Microalb/Creat Ratio: 156 mg/g{creat} — ABNORMAL HIGH (ref 0–29)
Microalbumin, Urine: 373.8 ug/mL

## 2021-11-15 LAB — HEMOGLOBIN A1C
Est. average glucose Bld gHb Est-mCnc: 160 mg/dL
Hgb A1c MFr Bld: 7.2 % — ABNORMAL HIGH (ref 4.8–5.6)

## 2021-12-04 ENCOUNTER — Other Ambulatory Visit: Payer: Self-pay | Admitting: Internal Medicine

## 2021-12-04 DIAGNOSIS — M7072 Other bursitis of hip, left hip: Secondary | ICD-10-CM

## 2021-12-04 NOTE — Telephone Encounter (Signed)
Requested medications are due for refill today.  unsure ? ?Requested medications are on the active medications list.  yes ? ?Last refill. 11/14/2021 #60 0 refills ? ?Future visit scheduled.   yes ? ?Notes to clinic.  Per office note of 11/14/2021 this medication was a trial. Unsure if it was meant to be continued. ? ? ? ?Requested Prescriptions  ?Pending Prescriptions Disp Refills  ? naproxen (NAPROSYN) 500 MG tablet [Pharmacy Med Name: Naproxen 500 MG Oral Tablet] 60 tablet 11  ?  Sig: TAKE 1 TABLET BY MOUTH TWICE  DAILY WITH A MEAL  ?  ? Analgesics:  NSAIDS Failed - 12/04/2021  9:52 AM  ?  ?  Failed - Manual Review: Labs are only required if the patient has taken medication for more than 8 weeks.  ?  ?  Passed - Cr in normal range and within 360 days  ?  Creat  ?Date Value Ref Range Status  ?02/10/2018 1.04 0.70 - 1.25 mg/dL Final  ?  Comment:  ?  For patients >83 years of age, the reference limit ?for Creatinine is approximately 13% higher for people ?identified as African-American. ?. ?  ? ?Creatinine, Ser  ?Date Value Ref Range Status  ?04/05/2021 1.14 0.76 - 1.27 mg/dL Final  ?  ?  ?  ?  Passed - HGB in normal range and within 360 days  ?  Hemoglobin  ?Date Value Ref Range Status  ?04/05/2021 15.0 13.0 - 17.7 g/dL Final  ?  ?  ?  ?  Passed - PLT in normal range and within 360 days  ?  Platelets  ?Date Value Ref Range Status  ?04/05/2021 278 150 - 450 x10E3/uL Final  ?  ?  ?  ?  Passed - HCT in normal range and within 360 days  ?  Hematocrit  ?Date Value Ref Range Status  ?04/05/2021 45.4 37.5 - 51.0 % Final  ?  ?  ?  ?  Passed - eGFR is 30 or above and within 360 days  ?  GFR, Est African American  ?Date Value Ref Range Status  ?02/10/2018 86 > OR = 60 mL/min/1.71m2 Final  ? ?GFR calc Af Amer  ?Date Value Ref Range Status  ?07/12/2020 71 >59 mL/min/1.73 Final  ?  Comment:  ?  **In accordance with recommendations from the NKF-ASN Task force,** ?  [facility] is in the process of updating its eGFR calculation to the ?   2021 CKD-EPI creatinine equation that estimates kidney function ?  without a race variable. ?  ? ?GFR, Est Non African American  ?Date Value Ref Range Status  ?02/10/2018 74 > OR = 60 mL/min/1.66m2 Final  ? ?GFR, Estimated  ?Date Value Ref Range Status  ?06/26/2020 >60 >60 mL/min Final  ?  Comment:  ?  (NOTE) ?Calculated using the CKD-EPI Creatinine Equation (2021) ?  ? ?GFR calc non Af Amer  ?Date Value Ref Range Status  ?07/12/2020 62 >59 mL/min/1.73 Final  ? ?eGFR  ?Date Value Ref Range Status  ?04/05/2021 69 >59 mL/min/1.73 Final  ?  ?  ?  ?  Passed - Patient is not pregnant  ?  ?  Passed - Valid encounter within last 12 months  ?  Recent Outpatient Visits   ? ?      ? 2 weeks ago Type II diabetes mellitus with complication ([place])  ? [facility] Glean Hess, MD  ? 8 months ago Annual physical exam  ? [facility]  Glean Hess, MD  ? 11 months ago Type II diabetes mellitus with complication [facility])  ? [facility] Glean Hess, MD  ? 1 year ago Essential hypertension  ? [facility] Glean Hess, MD  ? 1 year ago Essential hypertension  ? [facility] Glean Hess, MD  ? ?  ?  ?Future Appointments   ? ?        ? In 4 months Army Melia Jesse Sans, MD [facility], [place]  ? ?  ? ? ?  ?  ?  ?  ?

## 2021-12-10 ENCOUNTER — Ambulatory Visit: Payer: Medicare Other | Admitting: Internal Medicine

## 2022-01-05 ENCOUNTER — Other Ambulatory Visit: Payer: Self-pay | Admitting: Internal Medicine

## 2022-01-05 DIAGNOSIS — G8929 Other chronic pain: Secondary | ICD-10-CM

## 2022-01-08 NOTE — Telephone Encounter (Signed)
Requested medications are due for refill today.  yes  Requested medications are on the active medications list.  yes  Last refill. 08/07/2021 #270 0 refills  Future visit scheduled.   yes  Notes to clinic.  Refill failed protocol due to expired labs.    Requested Prescriptions  Pending Prescriptions Disp Refills   baclofen (LIORESAL) 20 MG tablet [Pharmacy Med Name: BACLOFEN  20MG  TAB] 270 tablet 0    Sig: TAKE 1 TABLET BY MOUTH 3 TIMES  DAILY     Analgesics:  Muscle Relaxants - baclofen Failed - 01/05/2022  4:10 PM      Failed - Cr in normal range and within 180 days    Creat  Date Value Ref Range Status  02/10/2018 1.04 0.70 - 1.25 mg/dL Final    Comment:    For patients >17 years of age, the reference limit for Creatinine is approximately 13% higher for people identified as African-American. .    Creatinine, Ser  Date Value Ref Range Status  04/05/2021 1.14 0.76 - 1.27 mg/dL Final         Failed - eGFR is 30 or above and within 180 days    GFR, Est African American  Date Value Ref Range Status  02/10/2018 86 > OR = 60 mL/min/1.58m Final   GFR calc Af Amer  Date Value Ref Range Status  07/12/2020 71 >59 mL/min/1.73 Final    Comment:    **In accordance with recommendations from the NKF-ASN Task force,**   [facility] is in the process of updating its eGFR calculation to the   2021 CKD-EPI creatinine equation that estimates kidney function   without a race variable.    GFR, Est Non African American  Date Value Ref Range Status  02/10/2018 74 > OR = 60 mL/min/1.735mFinal   GFR, Estimated  Date Value Ref Range Status  06/26/2020 >60 >60 mL/min Final    Comment:    (NOTE) Calculated using the CKD-EPI Creatinine Equation (2021)    GFR calc non Af Amer  Date Value Ref Range Status  07/12/2020 62 >59 mL/min/1.73 Final   eGFR  Date Value Ref Range Status  04/05/2021 69 >59 mL/min/1.73 Final         Passed - Valid encounter within last 6 months    Recent  Outpatient Visits           [facility]   9 months [facility]   [facility]   1 year ago Essential hypertension   [facility]   1 year ago Essential hypertension   [facility]       Future Appointments             In 3 months BeArmy MeliaaJesse SansMD [facility]

## 2022-01-15 ENCOUNTER — Other Ambulatory Visit: Payer: Self-pay | Admitting: Internal Medicine

## 2022-01-15 DIAGNOSIS — M7072 Other bursitis of hip, left hip: Secondary | ICD-10-CM

## 2022-01-16 NOTE — Telephone Encounter (Signed)
Requested medication (s) are due for refill today: Due 02/03/22  Requested medication (s) are on the active medication list: yes    Last refill: 12/04/21  #60  1 refill  Future visit scheduled yes 04/08/22  Notes to clinic: Failed due to labs, please review. Thank you.  Requested Prescriptions  Pending Prescriptions Disp Refills   naproxen (NAPROSYN) 500 MG tablet [Pharmacy Med Name: Naproxen 500 MG Oral Tablet] 120 tablet 5    Sig: TAKE 1 TABLET BY MOUTH TWICE  DAILY WITH MEALS     Analgesics:  NSAIDS Failed - 01/15/2022  8:38 AM      Failed - Manual Review: Labs are only required if the patient has taken medication for more than 8 weeks.      Passed - Cr in normal range and within 360 days    Creat  Date Value Ref Range Status  02/10/2018 1.04 0.70 - 1.25 mg/dL Final    Comment:    For patients >77 years of age, the reference limit for Creatinine is approximately 13% higher for people identified as African-American. .    Creatinine, Ser  Date Value Ref Range Status  04/05/2021 1.14 0.76 - 1.27 mg/dL Final         Passed - HGB in normal range and within 360 days    Hemoglobin  Date Value Ref Range Status  04/05/2021 15.0 13.0 - 17.7 g/dL Final         Passed - PLT in normal range and within 360 days    Platelets  Date Value Ref Range Status  04/05/2021 278 150 - 450 x10E3/uL Final         Passed - HCT in normal range and within 360 days    Hematocrit  Date Value Ref Range Status  04/05/2021 45.4 37.5 - 51.0 % Final         Passed - eGFR is 30 or above and within 360 days    GFR, Est African American  Date Value Ref Range Status  02/10/2018 86 > OR = 60 mL/min/1.70m Final   GFR calc Af Amer  Date Value Ref Range Status  07/12/2020 71 >59 mL/min/1.73 Final    Comment:    **In accordance with recommendations from the NKF-ASN Task force,**   [facility] is in the process of updating its eGFR calculation to the   2021 CKD-EPI creatinine equation that estimates  kidney function   without a race variable.    GFR, Est Non African American  Date Value Ref Range Status  02/10/2018 74 > OR = 60 mL/min/1.751mFinal   GFR, Estimated  Date Value Ref Range Status  06/26/2020 >60 >60 mL/min Final    Comment:    (NOTE) Calculated using the CKD-EPI Creatinine Equation (2021)    GFR calc non Af Amer  Date Value Ref Range Status  07/12/2020 62 >59 mL/min/1.73 Final   eGFR  Date Value Ref Range Status  04/05/2021 69 >59 mL/min/1.73 Final         Passed - Patient is not pregnant      Passed - Valid encounter within last 12 months    Recent Outpatient Visits           2 months [facility]   9 months ago Annual physical exam   [facility]   1 year ago Type II diabetes mellitus with complication (  [facility])   [facility] Glean Hess, MD   1 year ago Essential hypertension   [facility] Glean Hess, MD   1 year ago Essential hypertension   [facility] Glean Hess, MD       Future Appointments             In 2 months Army Melia Jesse Sans, MD [facility], [facility]

## 2022-01-18 ENCOUNTER — Other Ambulatory Visit: Payer: Self-pay | Admitting: Internal Medicine

## 2022-01-18 DIAGNOSIS — I10 Essential (primary) hypertension: Secondary | ICD-10-CM

## 2022-01-18 NOTE — Telephone Encounter (Signed)
Requested medication (s) are due for refill today:   Yes  Requested medication (s) are on the active medication list:   Yes  Future visit scheduled:   Yes in 2 mo.   Last ordered: Both 10/29/2021 #90, 0 refills  Returned because labs are due per protocol   Requested Prescriptions  Pending Prescriptions Disp Refills   lisinopril (ZESTRIL) 40 MG tablet [Pharmacy Med Name: Lisinopril 40 MG Oral Tablet] 90 tablet 3    Sig: TAKE 1 TABLET BY MOUTH DAILY     Cardiovascular:  ACE Inhibitors Failed - 01/18/2022  5:23 AM      Failed - Cr in normal range and within 180 days    Creat  Date Value Ref Range Status  02/10/2018 1.04 0.70 - 1.25 mg/dL Final    Comment:    For patients >41 years of age, the reference limit for Creatinine is approximately 13% higher for people identified as African-American. .    Creatinine, Ser  Date Value Ref Range Status  04/05/2021 1.14 0.76 - 1.27 mg/dL Final         Failed - K in normal range and within 180 days    Potassium  Date Value Ref Range Status  04/05/2021 4.7 3.5 - 5.2 mmol/L Final  06/08/2014 4.2 3.5 - 5.1 mmol/L Final         Passed - Patient is not pregnant      Passed - Last BP in normal range    BP Readings from Last 1 Encounters:  11/14/21 138/78         Passed - Valid encounter within last 6 months    Recent Outpatient Visits           2 months ago Type II diabetes mellitus with complication [facility])   [facility] Reubin Milan, MD   9 months ago Annual physical exam   [facility] Reubin Milan, MD   1 year ago Type II diabetes mellitus with complication [facility])   [facility] Reubin Milan, MD   1 year ago Essential hypertension   [facility], MD   1 year ago Essential hypertension   [facility] Re[facility], MD       Future Appointments             In 2 months Reubin Milan, MD [facility]               amLODipine (NORVASC) 5 MG tablet [Pharmacy Med Name: amLODIPine Besylate 5 MG Oral Tablet] 90 tablet 3    Sig: TAKE 1 TABLET BY MOUTH DAILY     Cardiovascular: Calcium Channel Blockers 2 Passed - 01/18/2022  5:23 AM      Passed - Last BP in normal range    BP Readings from Last 1 Encounters:  11/14/21 138/78         Passed - Last Heart Rate in normal range    Pulse Readings from Last 1 Encounters:  11/14/21 85         Passed - Valid encounter within last 6 months    Recent Outpatient Visits           2 months ago Type II diabetes mellitus with complication [facility])   [facility] Reubin Milan, MD   9 months ago Annual physical exam   [facility] Reubin Milan, MD   1 year ago Type II diabetes  mellitus with complication [facility])   [facility] Reubin Milan, MD   1 year ago Essential hypertension   [facility] Reubin Milan, MD   1 year ago Essential hypertension   [facility] Reubin Milan, MD       Future Appointments             In 2 months Judithann Graves Nyoka Cowden, MD [facility], [facility]

## 2022-01-19 ENCOUNTER — Other Ambulatory Visit: Payer: Self-pay | Admitting: Internal Medicine

## 2022-01-19 DIAGNOSIS — I10 Essential (primary) hypertension: Secondary | ICD-10-CM

## 2022-01-21 NOTE — Telephone Encounter (Signed)
Requested medication (s) are due for refill today: no  Requested medication (s) are on the active medication list: yes  Last refill:  01/18/22 #90/1  Future visit scheduled: yes  Notes to clinic:  pharmacy requesting 1 year supply. Please assess.      Requested Prescriptions  Pending Prescriptions Disp Refills   lisinopril (ZESTRIL) 40 MG tablet [Pharmacy Med Name: Lisinopril 40 MG Oral Tablet] 90 tablet 3    Sig: TAKE 1 TABLET BY MOUTH DAILY     Cardiovascular:  ACE Inhibitors Failed - 01/19/2022  6:15 PM      Failed - Cr in normal range and within 180 days    Creat  Date Value Ref Range Status  02/10/2018 1.04 0.70 - 1.25 mg/dL Final    Comment:    For patients >29 years of age, the reference limit for Creatinine is approximately 13% higher for people identified as African-American. .    Creatinine, Ser  Date Value Ref Range Status  04/05/2021 1.14 0.76 - 1.27 mg/dL Final         Failed - K in normal range and within 180 days    Potassium  Date Value Ref Range Status  04/05/2021 4.7 3.5 - 5.2 mmol/L Final  06/08/2014 4.2 3.5 - 5.1 mmol/L Final         Passed - Patient is not pregnant      Passed - Last BP in normal range    BP Readings from Last 1 Encounters:  11/14/21 138/78         Passed - Valid encounter within last 6 months    Recent Outpatient Visits           2 months ago Type II diabetes mellitus with complication [facility])   [facility] Reubin Milan, MD   9 months ago Annual physical exam   [facility] Re[facility], MD   1 year ago Type II diabetes mellitus with complication [facility])   [facility] Reubin Milan, MD   1 year ago Essential hypertension   [facility] Reubin Milan, MD   1 year ago Essential hypertension   [facility] Reubin Milan, MD       Future Appointments             In 2 months Reubin Milan, MD [facility]              amLODipine  (NORVASC) 5 MG tablet [Pharmacy Med Name: amLODIPine Besylate 5 MG Oral Tablet] 90 tablet 3    Sig: TAKE 1 TABLET BY MOUTH DAILY     Cardiovascular: Calcium Channel Blockers 2 Passed - 01/19/2022  6:15 PM      Passed - Last BP in normal range    BP Readings from Last 1 Encounters:  11/14/21 138/78         Passed - Last Heart Rate in normal range    Pulse Readings from Last 1 Encounters:  11/14/21 85         Passed - Valid encounter within last 6 months    Recent Outpatient Visits           2 months ago Type II diabetes mellitus with complication [facility])   [facility] Reubin Milan, MD   9 months ago Annual physical exam   [facility] Reubin Milan, MD   1 year ago Type II diabetes mellitus with complication (HCC)  [facility] Reubin Milan, MD   1 year ago Essential hypertension   [facility] Re[facility] Milan, MD   1 year ago Essential hypertension   [facility] Reubin Milan, MD       Future Appointments             In 2 months Judithann Graves Nyoka Cowden, MD [facility], [facility]

## 2022-02-03 ENCOUNTER — Other Ambulatory Visit: Payer: Self-pay | Admitting: Internal Medicine

## 2022-02-03 DIAGNOSIS — E1169 Type 2 diabetes mellitus with other specified complication: Secondary | ICD-10-CM

## 2022-02-04 NOTE — Telephone Encounter (Signed)
Requested Prescriptions  Pending Prescriptions Disp Refills  . metFORMIN (GLUCOPHAGE) 1000 MG tablet [Pharmacy Med Name: metFORMIN HCl 1000 MG Oral Tablet] 180 tablet 0    Sig: TAKE 1 TABLET BY MOUTH TWICE  DAILY WITH A MEAL     Endocrinology:  Diabetes - Biguanides Failed - 02/03/2022  1:16 PM      Failed - B12 Level in normal range and within 720 days    No results found for: "VITAMINB12"       Passed - Cr in normal range and within 360 days    Creat  Date Value Ref Range Status  02/10/2018 1.04 0.70 - 1.25 mg/dL Final    Comment:    For patients >17 years of age, the reference limit for Creatinine is approximately 13% higher for people identified as African-American. .    Creatinine, Ser  Date Value Ref Range Status  04/05/2021 1.14 0.76 - 1.27 mg/dL Final         Passed - HBA1C is between 0 and 7.9 and within 180 days    Hgb A1c MFr Bld  Date Value Ref Range Status  11/14/2021 7.2 (H) 4.8 - 5.6 % Final    Comment:             Prediabetes: 5.7 - 6.4          Diabetes: >6.4          Glycemic control for adults with diabetes: <7.0          Passed - eGFR in normal range and within 360 days    GFR, Est African American  Date Value Ref Range Status  02/10/2018 86 > OR = 60 mL/min/1.63m Final   GFR calc Af Amer  Date Value Ref Range Status  07/12/2020 71 >59 mL/min/1.73 Final    Comment:    **In accordance with recommendations from the NKF-ASN Task force,**   [facility] is in the process of updating its eGFR calculation to the   2021 CKD-EPI creatinine equation that estimates kidney function   without a race variable.    GFR, Est Non African American  Date Value Ref Range Status  02/10/2018 74 > OR = 60 mL/min/1.714mFinal   GFR, Estimated  Date Value Ref Range Status  06/26/2020 >60 >60 mL/min Final    Comment:    (NOTE) Calculated using the CKD-EPI Creatinine Equation (2021)    GFR calc non Af Amer  Date Value Ref Range Status  07/12/2020 62 >59  mL/min/1.73 Final   eGFR  Date Value Ref Range Status  04/05/2021 69 >59 mL/min/1.73 Final         Passed - Valid encounter within last 6 months    Recent Outpatient Visits          2 months ago [facility]   10 months ago Annual physical exam   [facility]   1 year ago [facility]   1 year ago Essential hypertension   [facility] [facility]   1 year ago Essential hypertension   [facility]      Future Appointments            In 2 months [facility]se Sans[facility]  Passed - CBC within normal limits and completed in the last 12 months    WBC  Date Value Ref Range Status  04/05/2021 7.2 3.4 - 10.8 x10E3/uL Final  06/26/2020 7.4 4.0 - 10.5 K/uL Final   RBC  Date Value Ref Range Status  04/05/2021 5.30 4.14 - 5.80 x10E6/uL Final  06/26/2020 5.42 4.22 - 5.81 MIL/uL Final   Hemoglobin  Date Value Ref Range Status  04/05/2021 15.0 13.0 - 17.7 g/dL Final   Hematocrit  Date Value Ref Range Status  04/05/2021 45.4 37.5 - 51.0 % Final   MCHC  Date Value Ref Range Status  04/05/2021 33.0 31.5 - 35.7 g/dL Final  06/26/2020 33.1 30.0 - 36.0 g/dL Final   [facility]  Date Value Ref Range Status  04/05/2021 28.3 26.6 - 33.0 pg Final  06/26/2020 28.8 26.0 - 34.0 pg Final   MCV  Date Value Ref Range Status  04/05/2021 86 79 - 97 fL Final  12/29/2012 85 80 - 100 fL Final   No results found for: "PLTCOUNTKUC", "LABPLAT", "POCPLA" RDW  Date Value Ref Range Status  04/05/2021 12.3 11.6 - 15.4 % Final  12/29/2012 12.6 11.5 - 14.5 % Final

## 2022-02-08 ENCOUNTER — Other Ambulatory Visit: Payer: Self-pay | Admitting: Internal Medicine

## 2022-02-08 DIAGNOSIS — K219 Gastro-esophageal reflux disease without esophagitis: Secondary | ICD-10-CM

## 2022-02-08 DIAGNOSIS — E1169 Type 2 diabetes mellitus with other specified complication: Secondary | ICD-10-CM

## 2022-02-08 DIAGNOSIS — N400 Enlarged prostate without lower urinary tract symptoms: Secondary | ICD-10-CM

## 2022-02-15 ENCOUNTER — Other Ambulatory Visit: Payer: Self-pay | Admitting: Internal Medicine

## 2022-02-15 DIAGNOSIS — J3089 Other allergic rhinitis: Secondary | ICD-10-CM

## 2022-02-15 NOTE — Telephone Encounter (Signed)
Requested Prescriptions  Pending Prescriptions Disp Refills  . fluticasone (FLONASE) 50 MCG/ACT nasal spray [Pharmacy Med Name: Fluticasone Propionate 50 MCG/ACT Nasal Suspension] 32 g 1    Sig: USE 1 SPRAY IN BOTH  NOSTRILS DAILY     Ear, Nose, and Throat: Nasal Preparations - Corticosteroids Passed - 02/15/2022  5:11 PM      Passed - Valid encounter within last 12 months    Recent Outpatient Visits          3 months ago Type II diabetes mellitus with complication [facility])   [facility] Reubin Milan, MD   10 months ago Annual physical exam   [facility] Re[facility] Milan, MD   1 year ago Type II diabetes mellitus with complication [facility])   [facility] Reubin Milan, MD   1 year ago Essential hypertension   [facility] Reubin Milan, MD   1 year ago Essential hypertension   [facility] Reubin [facility], MD      Future Appointments            In 1 month Judithann Graves, Nyoka Cowden, MD [facility],  [facility]

## 2022-02-23 ENCOUNTER — Other Ambulatory Visit: Payer: Self-pay | Admitting: Internal Medicine

## 2022-02-23 DIAGNOSIS — E118 Type 2 diabetes mellitus with unspecified complications: Secondary | ICD-10-CM

## 2022-02-25 NOTE — Telephone Encounter (Signed)
LRF 11/14/21  #90  1 refill. Request sent via Interface. Pt has refill available. Requested Prescriptions  Pending Prescriptions Disp Refills  . glipiZIDE (GLUCOTROL XL) 2.5 MG 24 hr tablet [Pharmacy Med Name: glipiZIDE ER 2.5 MG Oral Tablet Extended Release 24 Hour] 100 tablet 2    Sig: TAKE 1 TABLET BY MOUTH DAILY  WITH BREAKFAST     Endocrinology:  Diabetes - Sulfonylureas Passed - 02/23/2022 11:21 PM      Passed - HBA1C is between 0 and 7.9 and within 180 days    Hgb A1c MFr Bld  Date Value Ref Range Status  11/14/2021 7.2 (H) 4.8 - 5.6 % Final    Comment:             Prediabetes: 5.7 - 6.4          Diabetes: >6.4          Glycemic control for adults with diabetes: <7.0          Passed - Cr in normal range and within 360 days    Creat  Date Value Ref Range Status  02/10/2018 1.04 0.70 - 1.25 mg/dL Final    Comment:    For patients >28 years of age, the reference limit for Creatinine is approximately 13% higher for people identified as African-American. .    Creatinine, Ser  Date Value Ref Range Status  04/05/2021 1.14 0.76 - 1.27 mg/dL Final         Passed - Valid encounter within last 6 months    Recent Outpatient Visits          3 months ago Type II diabetes mellitus with complication [facility])   [facility] Reubin Milan, MD   10 months ago Annual physical exam   [facility] Reubin Milan, MD   1 year ago Type II diabetes mellitus with complication [facility])   [facility] Reubin Milan, MD   1 year ago Essential hypertension   [facility] Reubin Milan, MD   1 year ago Essential hypertension   [facility] Reubin Milan, MD      Future Appointments            In 1 month Judithann Graves Nyoka Cowden, MD [facility], [facility]

## 2022-04-07 ENCOUNTER — Encounter: Payer: Self-pay | Admitting: Internal Medicine

## 2022-04-08 ENCOUNTER — Ambulatory Visit: Payer: Medicare Other | Admitting: Internal Medicine

## 2022-04-08 ENCOUNTER — Ambulatory Visit: Payer: Self-pay | Admitting: *Deleted

## 2022-04-08 NOTE — Telephone Encounter (Signed)
Summary: Diziness & Vomitting Advice   Pt is calling to cancel his CPE this morning. Pt reports that he is dizzy since Saturday morning and has been vomiting. Please advise      Attempted to triage pt, "I don't feel like talking right now." Told pt symptoms concerning and would he mind a few questions so Dr. Judithann Graves would be aware. States "It's my vertigo acting up." Attempted to assess further, states "I said I don't feel like talking now." Asked pt to CB when able. Reason for Disposition  Vomiting occurs with dizziness  Answer Assessment - Initial Assessment Questions 1. DESCRIPTION: "Describe your dizziness."     Please see summary. 2. VERTIGO: "Do you feel like either you or the room is spinning or tilting?"      *No Answer* 3. LIGHTHEADED: "Do you feel lightheaded?" (e.g., somewhat faint, woozy, weak upon standing)     *No Answer* 4. SEVERITY: "How bad is it?"  "Can you walk?"   - MILD: Feels slightly dizzy and unsteady, but is walking normally.   - MODERATE: Feels unsteady when walking, but not falling; interferes with normal activities (e.g., school, work).   - SEVERE: Unable to walk without falling, or requires assistance to walk without falling.     *No Answer* 5. ONSET:  "When did the dizziness begin?"     *No Answer* 6. AGGRAVATING FACTORS: "Does anything make it worse?" (e.g., standing, change in head position)     *No Answer* 7. CAUSE: "What do you think is causing the dizziness?"     *No Answer* 8. RECURRENT SYMPTOM: "Have you had dizziness before?" If Yes, ask: "When was the last time?" "What happened that time?"     *No Answer* 9. OTHER SYMPTOMS: "Do you have any other symptoms?" (e.g., headache, weakness, numbness, vomiting, earache)     *No Answer* 10. PREGNANCY: "Is there any chance you are pregnant?" "When was your last menstrual period?"       *No Answer*  Protocols used: Dizziness - Vertigo-A-AH

## 2022-04-08 NOTE — Progress Notes (Deleted)
  Date:  04/08/2022   Name:  Bruce Richardson   DOB:  04/25/1951   MRN:  [id number]   Chief Complaint: No chief complaint on file. Bruce Richardson is a 71 y.o. male who presents today for his Complete Annual Exam. He feels {DESC; WELL/FAIRLY WELL/POORLY:18703}. He reports exercising ***. He reports he is sleeping {DESC; WELL/FAIRLY WELL/POORLY:18703}.   Colonoscopy: Cologuard 03/2020 negative  Immunization History  Administered Date(s) Administered   Moderna Sars-Covid-2 Vaccination 11/15/2019, 04/25/2020, 05/23/2020   Health Maintenance Due  Topic Date Due   COVID-19 Vaccine (4 - Moderna series) 07/18/2020   OPHTHALMOLOGY EXAM  02/06/2022   INFLUENZA VACCINE  03/19/2022   FOOT EXAM  04/05/2022    Lab Results  Component Value Date   PSA1 0.5 04/05/2021    Hypertension This is a chronic problem. The problem is controlled. Pertinent negatives include no chest pain, headaches, palpitations or shortness of breath. Past treatments include calcium channel blockers and ACE inhibitors. The current treatment provides moderate improvement. 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 There is no history of kidney disease.  Diabetes He presents for his follow-up diabetic visit. He has type 2 diabetes mellitus. His disease course has been stable. Pertinent negatives for hypoglycemia include no dizziness, headaches or nervousness/anxiousness. Pertinent negatives for diabetes include no chest pain and no fatigue. Diabetic complications include a CVA. Current diabetic treatment includes oral agent (dual therapy) (metformin and glipizide). There is no change in his home blood glucose trend.  Hyperlipidemia This is a chronic problem. The problem is resistant. Recent lipid tests were reviewed and are high (LDL 90). Pertinent negatives include no chest pain, myalgias or shortness of breath. Current antihyperlipidemic treatment includes statins (high dose Crestor). The current treatment provides  moderate improvement of lipids.    Lab Results  Component Value Date   NA 138 04/05/2021   K 4.7 04/05/2021   CO2 24 04/05/2021   GLUCOSE 184 (H) 04/05/2021   BUN 15 04/05/2021   CREATININE 1.14 04/05/2021   CALCIUM 9.9 04/05/2021   EGFR 69 04/05/2021   GFRNONAA 62 07/12/2020   Lab Results  Component Value Date   CHOL 147 04/05/2021   HDL 39 (L) 04/05/2021   LDLCALC 90 04/05/2021   TRIG 94 04/05/2021   CHOLHDL 3.8 04/05/2021   Lab Results  Component Value Date   TSH 2.879 04/03/2020   Lab Results  Component Value Date   HGBA1C 7.2 (H) 11/14/2021   Lab Results  Component Value Date   WBC 7.2 04/05/2021   HGB 15.0 04/05/2021   HCT 45.4 04/05/2021   MCV 86 04/05/2021   PLT 278 04/05/2021   Lab Results  Component Value Date   ALT 15 04/05/2021   AST 11 04/05/2021   ALKPHOS 81 04/05/2021   BILITOT 0.7 04/05/2021   No results found for: "25OHVITD2", "25OHVITD3", "VD25OH"   Review of Systems  Constitutional:  Negative for appetite change, chills, diaphoresis, fatigue and unexpected weight change.  HENT:  Negative for hearing loss, tinnitus, trouble swallowing and voice change.   Eyes:  Negative for visual disturbance.  Respiratory:  Negative for choking, shortness of breath and wheezing.   Cardiovascular:  Negative for chest pain, palpitations and leg swelling.  Gastrointestinal:  Negative for abdominal pain, blood in stool, constipation and diarrhea.  Genitourinary:  Negative for difficulty urinating, dysuria and frequency.  Musculoskeletal:  Negative for arthralgias, back pain and myalgias.  Skin:  Negative for color change and rash.  Neurological:    Negative for dizziness, syncope and headaches.  Hematological:  Negative for adenopathy.  Psychiatric/Behavioral:  Negative for dysphoric mood and sleep disturbance. The patient is not nervous/anxious.     Patient Active Problem List   Diagnosis Date Noted   Eczema 11/14/2021   BPPV (benign paroxysmal  positional vertigo), unspecified laterality 12/24/2020   Chronic diastolic congestive heart failure (HCC) 11/29/2019   Centrilobular emphysema (HCC) 01/26/2019   Aortic atherosclerosis (HCC) 01/26/2019   Echocardiogram shows left ventricular diastolic dysfunction 02/16/2018   Erectile dysfunction 02/16/2018   History of CVA (cerebrovascular accident) without residual deficits 08/13/2017   BPH without obstruction/lower urinary tract symptoms 03/19/2017   Chronic bilateral low back pain 11/14/2016   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   GERD (gastroesophageal reflux disease) 04/11/2016   Obesity 04/11/2016   Allergic rhinitis 08/04/2015   Essential hypertension 07/04/2015   Hyperlipidemia due to type 2 diabetes mellitus (HCC) 07/04/2015   Type II diabetes mellitus with complication (HCC) 07/04/2015    No Known Allergies  Past Surgical History:  Procedure Laterality Date   CIRCUMCISION N/A 07/24/2015   Procedure: CIRCUMCISION ADULT;  Surgeon: Brian James Budzyn, MD;  Location: [facility] ORS;  Service: Urology;  Laterality: N/A;   EYE SURGERY Right    right eye cataract surgery    SHOULDER ARTHROSCOPY Bilateral    SHOULDER ARTHROSCOPY Right 12/22/2014   Procedure: RIGHT SHOULDER ARTHROSCOPY /DECOMPRESSION/ROTATOR CUFF REPAIR OF RECURRENT ROTATOR CUFF TEAR;  Surgeon: John J Poggi, MD;  Location: [facility] ORS;  Service: Orthopedics;  Laterality: Right;    Social History   Tobacco Use   Smoking status: Former    Packs/day: 1.50    Years: 25.00    Total pack years: 37.50    Types: Cigarettes    Start date: 12/19/2001    Quit date: 07/03/2002    Years since quitting: 19.7   Smokeless tobacco: Former   Tobacco comments:    quit smoking in 06/2001  Vaping Use   Vaping Use: Never used  Substance Use Topics   Alcohol use: No    Alcohol/week: 0.0 standard drinks of alcohol   Drug use: No     Medication list has been reviewed and updated.  No outpatient medications  have been marked as taking for the 04/08/22 encounter (Appointment) with Berglund, Laura H, MD.       11/14/2021    9:44 AM 04/05/2021    8:32 AM 12/25/2020    8:35 AM 08/07/2020    9:20 AM  GAD 7 : Generalized Anxiety Score  Nervous, Anxious, on Edge 1 0 2 0  Control/stop worrying 0 3 3 0  Worry too much - different things 2 3 3 0  Trouble relaxing 2 2 3 0  Restless 1 2 2 0  Easily annoyed or irritable 2 3 3 0  Afraid - awful might happen 0 0 2 0  Total GAD 7 Score 8 13 18 0  Anxiety Difficulty   Extremely difficult Not difficult at all       11/14/2021    9:42 AM 10/22/2021    8:13 AM 04/05/2021    8:31 AM  Depression screen PHQ 2/9  Decreased Interest 1 3 0  Down, Depressed, Hopeless 2 3 0  PHQ - 2 Score 3 6 0  Altered sleeping 3 1 2  Tired, decreased energy 2 1 2  Change in appetite 2 0 1  Feeling bad or failure about yourself  1 0 1  Trouble concentrating 1   0 0  Moving slowly or fidgety/restless 0 0 2  Suicidal thoughts 0 0 0  PHQ-9 Score 12 8 8  Difficult doing work/chores  Not difficult at all Not difficult at all    BP Readings from Last 3 Encounters:  11/14/21 138/78  04/05/21 136/80  12/25/20 138/86    Physical Exam Vitals and nursing note reviewed.  Constitutional:      Appearance: Normal appearance. He is well-developed.  HENT:     Head: Normocephalic.     Right Ear: Tympanic membrane, ear canal and external ear normal.     Left Ear: Tympanic membrane, ear canal and external ear normal.     Nose: Nose normal.  Eyes:     Conjunctiva/sclera: Conjunctivae normal.     Pupils: Pupils are equal, round, and reactive to light.  Neck:     Thyroid: No thyromegaly.     Vascular: No carotid bruit.  Cardiovascular:     Rate and Rhythm: Normal rate and regular rhythm.     Heart sounds: Normal heart sounds.  Pulmonary:     Effort: Pulmonary effort is normal.     Breath sounds: Normal breath sounds. No wheezing.  Chest:  Breasts:    Right: No mass.     Left:  No mass.  Abdominal:     General: Bowel sounds are normal.     Palpations: Abdomen is soft.     Tenderness: There is no abdominal tenderness.  Musculoskeletal:        General: Normal range of motion.     Cervical back: Normal range of motion and neck supple.  Lymphadenopathy:     Cervical: No cervical adenopathy.  Skin:    General: Skin is warm and dry.     Capillary Refill: Capillary refill takes less than 2 seconds.  Neurological:     General: No focal deficit present.     Mental Status: He is alert and oriented to person, place, and time.     Deep Tendon Reflexes: Reflexes are normal and symmetric.  Psychiatric:        Attention and Perception: Attention normal.        Mood and Affect: Mood normal.        Thought Content: Thought content normal.     Wt Readings from Last 3 Encounters:  11/14/21 233 lb (105.7 kg)  04/05/21 232 lb (105.2 kg)  12/25/20 227 lb (103 kg)    There were no vitals taken for this visit.  Assessment and Plan:     

## 2022-04-08 NOTE — Progress Notes (Signed)
No Show/cancellation

## 2022-04-09 ENCOUNTER — Other Ambulatory Visit: Payer: Self-pay | Admitting: Internal Medicine

## 2022-04-09 DIAGNOSIS — M7072 Other bursitis of hip, left hip: Secondary | ICD-10-CM

## 2022-04-10 NOTE — Telephone Encounter (Signed)
Requested medication (s) are due for refill today: yes  Requested medication (s) are on the active medication list: yes  Last refill:  01/16/22 #60 1 RF  Future visit scheduled: no  Notes to clinic:  overdue lab work   Requested Prescriptions  Pending Prescriptions Disp Refills   naproxen (NAPROSYN) 500 MG tablet [Pharmacy Med Name: Naproxen 500 MG Oral Tablet] 120 tablet 5    Sig: TAKE 1 TABLET BY MOUTH TWICE  DAILY WITH MEALS     Analgesics:  NSAIDS Failed - 04/09/2022 11:02 PM      Failed - Manual Review: Labs are only required if the patient has taken medication for more than 8 weeks.      Failed - Cr in normal range and within 360 days    Creat  Date Value Ref Range Status  02/10/2018 1.04 0.70 - 1.25 mg/dL Final    Comment:    For patients >86 years of age, the reference limit for Creatinine is approximately 13% higher for people identified as African-American. .    Creatinine, Ser  Date Value Ref Range Status  04/05/2021 1.14 0.76 - 1.27 mg/dL Final         Failed - HGB in normal range and within 360 days    Hemoglobin  Date Value Ref Range Status  04/05/2021 15.0 13.0 - 17.7 g/dL Final         Failed - PLT in normal range and within 360 days    Platelets  Date Value Ref Range Status  04/05/2021 278 150 - 450 x10E3/uL Final         Failed - HCT in normal range and within 360 days    Hematocrit  Date Value Ref Range Status  04/05/2021 45.4 37.5 - 51.0 % Final         Failed - eGFR is 30 or above and within 360 days    GFR, Est African American  Date Value Ref Range Status  02/10/2018 86 > OR = 60 mL/min/1.32m Final   GFR calc Af Amer  Date Value Ref Range Status  07/12/2020 71 >59 mL/min/1.73 Final    Comment:    **In accordance with recommendations from the NKF-ASN Task force,**   [facility] is in the process of updating its eGFR calculation to the   2021 CKD-EPI creatinine equation that estimates kidney function   without a race variable.     GFR, Est Non African American  Date Value Ref Range Status  02/10/2018 74 > OR = 60 mL/min/1.716mFinal   GFR, Estimated  Date Value Ref Range Status  06/26/2020 >60 >60 mL/min Final    Comment:    (NOTE) Calculated using the CKD-EPI Creatinine Equation (2021)    GFR calc non Af Amer  Date Value Ref Range Status  07/12/2020 62 >59 mL/min/1.73 Final   eGFR  Date Value Ref Range Status  04/05/2021 69 >59 mL/min/1.73 Final         Passed - Patient is not pregnant      Passed - Valid encounter within last 12 months    Recent Outpatient Visits           2 days ago Erroneous encounter - disregard   [place] [facility]   4 months ago [facility]  [place] [facility]   1 year ago  Annual physical exam   [place] [facility] Medicine at [facility], Jesse Sans, MD   1 year ago Type II diabetes mellitus with complication [facility])   [place] [facility] at [facility], Jesse Sans, MD   1 year ago Essential hypertension    Primary Care and Sports Medicine at [facility], Jesse Sans, MD

## 2022-04-18 ENCOUNTER — Other Ambulatory Visit: Payer: Self-pay | Admitting: Internal Medicine

## 2022-04-18 DIAGNOSIS — I10 Essential (primary) hypertension: Secondary | ICD-10-CM

## 2022-04-19 NOTE — Telephone Encounter (Signed)
Requested Prescriptions  Pending Prescriptions Disp Refills  . lisinopril (ZESTRIL) 40 MG tablet [Pharmacy Med Name: Lisinopril 40 MG Oral Tablet] 100 tablet     Sig: TAKE 1 TABLET BY MOUTH DAILY     Cardiovascular:  ACE Inhibitors Failed - 04/18/2022 10:31 PM      Failed - Cr in normal range and within 180 days    Creat  Date Value Ref Range Status  02/10/2018 1.04 0.70 - 1.25 mg/dL Final    Comment:    For patients >72 years of age, the reference limit for Creatinine is approximately 13% higher for people identified as African-American. .    Creatinine, Ser  Date Value Ref Range Status  04/05/2021 1.14 0.76 - 1.27 mg/dL Final         Failed - K in normal range and within 180 days    Potassium  Date Value Ref Range Status  04/05/2021 4.7 3.5 - 5.2 mmol/L Final  06/08/2014 4.2 3.5 - 5.1 mmol/L Final         Passed - Patient is not pregnant      Passed - Last BP in normal range    BP Readings from Last 1 Encounters:  11/14/21 138/78         Passed - Valid encounter within last 6 months    Recent Outpatient Visits          1 week ago Erroneous encounter - disregard   [place] [facility] at [facility], Nyoka Cowden, MD   5 months ago Type II diabetes mellitus with complication [facility])   [place] [facility] at [facility], Nyoka Cowden, MD   1 year ago Annual physical exam   [place] [facility] at [facility], Nyoka Cowden, MD   1 year ago Type II diabetes mellitus with complication [facility])   [place] [facility] and [facility] at [facility], Nyoka Cowden, MD   1 year ago Essential hypertension   [place] [facility] Care and Sports Medicine at [facility], Nyoka Cowden, MD             . albuterol (VENTOLIN HFA) 108 (90 Base) MCG/ACT inhaler [Pharmacy Med Name: ALBUTEROL HFA 90MCG/ACT (PA)] 34 g 2    Sig: USE 2 INHALATIONS BY MOUTH  EVERY 6  HOURS AS NEEDED FOR WHEEZING OR SHORTNESS OF  BREATH     Pulmonology:  Beta Agonists 2 Passed - 04/18/2022 10:31 PM      Passed - Last BP in normal range    BP Readings from Last 1 Encounters:  11/14/21 138/78         Passed - Last Heart Rate in normal range    Pulse Readings from Last 1 Encounters:  11/14/21 85         Passed - Valid encounter within last 12 months    Recent Outpatient Visits          1 week ago Erroneous encounter - disregard   [place] [facility] at [facility], Nyoka Cowden, MD   5 months ago Type II diabetes mellitus with complication [facility])   [place] [facility] at [facility], Nyoka Cowden, MD   1 year ago Annual physical exam   [facility] at [facility], Nyoka Cowden, MD   1 year ago Type II  diabetes mellitus with complication [facility])   [place] [facility] and Sports Medicine at [facility], Nyoka Cowden, MD   1 year ago Essential hypertension   [place] [facility] and [facility] Medicine at [facility], Nyoka Cowden, MD             . amLODipine (NORVASC) 5 MG tablet [Pharmacy Med Name: amLODIPine Besylate 5 MG Oral Tablet] 100 tablet     Sig: TAKE 1 TABLET BY MOUTH DAILY     Cardiovascular: Calcium Channel Blockers 2 Passed - 04/18/2022 10:31 PM      Passed - Last BP in normal range    BP Readings from Last 1 Encounters:  11/14/21 138/78         Passed - Last Heart Rate in normal range    Pulse Readings from Last 1 Encounters:  11/14/21 85         Passed - Valid encounter within last 6 months    Recent Outpatient Visits          1 week ago Erroneous encounter - disregard   [place] [facility] and [facility] Medicine at [facility], Nyoka Cowden, MD   5 months ago Type II diabetes mellitus with complication [facility])   [place] [facility] Care and Sports Medicine at [facility], Nyoka Cowden, MD    1 year ago Annual physical exam   [place] [facility] and [facility] Medicine at [facility], Nyoka Cowden, MD   1 year ago Type II diabetes mellitus with complication [facility])   [place] [facility] Care and Sports Medicine at [facility], Nyoka Cowden, MD   1 year ago Essential hypertension   [place] Primary Care and Sports Medicine at [facility], Nyoka Cowden, MD

## 2022-04-19 NOTE — Telephone Encounter (Signed)
Requested medication (s) are due for refill today: no  Requested medication (s) are on the active medication list: yes  Last refill:  both on 01/18/22  Future visit scheduled: no  Notes to clinic:  Pharmacy requesting #100 tab refill/ there are labs that are overdue and routed back to office per protocol   Requested Prescriptions  Pending Prescriptions Disp Refills   lisinopril (ZESTRIL) 40 MG tablet [Pharmacy Med Name: Lisinopril 40 MG Oral Tablet] 100 tablet     Sig: TAKE 1 TABLET BY MOUTH DAILY     Cardiovascular:  ACE Inhibitors Failed - 04/18/2022 10:31 PM      Failed - Cr in normal range and within 180 days    Creat  Date Value Ref Range Status  02/10/2018 1.04 0.70 - 1.25 mg/dL Final    Comment:    For patients >20 years of age, the reference limit for Creatinine is approximately 13% higher for people identified as African-American. .    Creatinine, Ser  Date Value Ref Range Status  04/05/2021 1.14 0.76 - 1.27 mg/dL Final         Failed - K in normal range and within 180 days    Potassium  Date Value Ref Range Status  04/05/2021 4.7 3.5 - 5.2 mmol/L Final  06/08/2014 4.2 3.5 - 5.1 mmol/L Final         Passed - Patient is not pregnant      Passed - Last BP in normal range    BP Readings from Last 1 Encounters:  11/14/21 138/78         Passed - Valid encounter within last 6 months    Recent Outpatient Visits           1 week ago Erroneous encounter - disregard   [place] [facility] at [facility], Nyoka Cowden, MD   5 months ago Type II diabetes mellitus with complication [facility])   [place] [facility] at [facility], Nyoka Cowden, MD   1 year ago Annual physical exam   [place] [facility] and Sports Medicine at [facility], Nyoka Cowden, MD   1 year ago Type II diabetes mellitus with complication [facility])   [place] [facility] Care and Sports Medicine at [facility],  Nyoka Cowden, MD   1 year ago Essential hypertension   [place] [facility] and Sports Medicine at [facility], Nyoka Cowden, MD               amLODipine (NORVASC) 5 MG tablet [Pharmacy Med Name: amLODIPine Besylate 5 MG Oral Tablet] 100 tablet     Sig: TAKE 1 TABLET BY MOUTH DAILY     Cardiovascular: Calcium Channel Blockers 2 Passed - 04/18/2022 10:31 PM      Passed - Last BP in normal range    BP Readings from Last 1 Encounters:  11/14/21 138/78         Passed - Last Heart Rate in normal range    Pulse Readings from Last 1 Encounters:  11/14/21 85         Passed - Valid encounter within last 6 months    Recent Outpatient Visits           1 week ago Erroneous encounter - disregard   [place] [facility] at [facility], Nyoka Cowden, MD   5 months ago Type II diabetes mellitus with complication (HCC)  [place] [facility] and Sports Medicine at [facility], Nyoka Cowden, MD   1 year ago Annual physical exam   [facility] at [facility], Nyoka Cowden, MD   1 year ago Type II diabetes mellitus with complication [facility])   [place] [facility] and Sports Medicine at [facility], Nyoka Cowden, MD   1 year ago Essential hypertension   [place] Primary Care and Sports Medicine at [facility], Nyoka Cowden, MD              Signed Prescriptions Disp Refills   albuterol (VENTOLIN HFA) 108 (90 Base) MCG/ACT inhaler 34 g 2    Sig: USE 2 INHALATIONS BY MOUTH  EVERY 6 HOURS AS NEEDED FOR WHEEZING OR SHORTNESS OF  BREATH     Pulmonology:  Beta Agonists 2 Passed - 04/18/2022 10:31 PM      Passed - Last BP in normal range    BP Readings from Last 1 Encounters:  11/14/21 138/78         Passed - Last Heart Rate in normal range    Pulse Readings from Last 1 Encounters:  11/14/21 85         Passed - Valid encounter within last 12 months    Recent Outpatient  Visits           1 week ago Erroneous encounter - disregard   [place] [facility] at [facility], Nyoka Cowden, MD   5 months ago Type II diabetes mellitus with complication [facility])   [place] [facility] Sports Medicine at [facility], Nyoka Cowden, MD   1 year ago Annual physical exam   [place] [facility] and Sports Medicine at [facility], Nyoka Cowden, MD   1 year ago Type II diabetes mellitus with complication [facility]   [place] [facility] Care and Sports Medicine at [facility], Nyoka Cowden, MD   1 year ago Essential hypertension   [place] [facility] and Sports Medicine at [facility], Nyoka Cowden, MD

## 2022-04-25 ENCOUNTER — Other Ambulatory Visit: Payer: Self-pay | Admitting: Internal Medicine

## 2022-04-25 DIAGNOSIS — N400 Enlarged prostate without lower urinary tract symptoms: Secondary | ICD-10-CM

## 2022-04-25 DIAGNOSIS — K219 Gastro-esophageal reflux disease without esophagitis: Secondary | ICD-10-CM

## 2022-04-25 DIAGNOSIS — E1169 Type 2 diabetes mellitus with other specified complication: Secondary | ICD-10-CM

## 2022-04-26 NOTE — Telephone Encounter (Signed)
Requested medication (s) are due for refill today: metformin only  Requested medication (s) are on the active medication list: yes to all  Last refill:  tamulosin, rosuvastatin and pantoprazole last refilled 05/23/21 for 1 years worth    //metformin last RF 02/04/22 #180  Future visit scheduled: no  Notes to clinic:  sent pt message via MyChart to call office to make appt for med refills and possibly labs. Too soon to refill everything except Metformin  Overdue lab work   Requested Prescriptions  Pending Prescriptions Disp Refills   metFORMIN (GLUCOPHAGE) 1000 MG tablet [Pharmacy Med Name: metFORMIN HCl 1000 MG Oral Tablet] 180 tablet 3    Sig: TAKE 1 TABLET BY MOUTH TWICE  DAILY WITH A MEAL     Endocrinology:  Diabetes - Biguanides Failed - 04/25/2022 10:10 PM      Failed - Cr in normal range and within 360 days    Creat  Date Value Ref Range Status  02/10/2018 1.04 0.70 - 1.25 mg/dL Final    Comment:    For patients >28 years of age, the reference limit for Creatinine is approximately 13% higher for people identified as African-American. .    Creatinine, Ser  Date Value Ref Range Status  04/05/2021 1.14 0.76 - 1.27 mg/dL Final         Failed - eGFR in normal range and within 360 days    GFR, Est African American  Date Value Ref Range Status  02/10/2018 86 > OR = 60 mL/min/1.21m Final   GFR calc Af Amer  Date Value Ref Range Status  07/12/2020 71 >59 mL/min/1.73 Final    Comment:    **In accordance with recommendations from the NKF-ASN Task force,**   [facility] is in the process of updating its eGFR calculation to the   2021 CKD-EPI creatinine equation that estimates kidney function   without a race variable.    GFR, Est Non African American  Date Value Ref Range Status  02/10/2018 74 > OR = 60 mL/min/1.785mFinal   GFR, Estimated  Date Value Ref Range Status  06/26/2020 >60 >60 mL/min Final    Comment:    (NOTE) Calculated using the CKD-EPI Creatinine Equation  (2021)    GFR calc non Af Amer  Date Value Ref Range Status  07/12/2020 62 >59 mL/min/1.73 Final   eGFR  Date Value Ref Range Status  04/05/2021 69 >59 mL/min/1.73 Final         Failed - B12 Level in normal range and within 720 days    No results found for: "VITAMINB12"       Failed - CBC within normal limits and completed in the last 12 months    WBC  Date Value Ref Range Status  04/05/2021 7.2 3.4 - 10.8 x10E3/uL Final  06/26/2020 7.4 4.0 - 10.5 K/uL Final   RBC  Date Value Ref Range Status  04/05/2021 5.30 4.14 - 5.80 x10E6/uL Final  06/26/2020 5.42 4.22 - 5.81 MIL/uL Final   Hemoglobin  Date Value Ref Range Status  04/05/2021 15.0 13.0 - 17.7 g/dL Final   Hematocrit  Date Value Ref Range Status  04/05/2021 45.4 37.5 - 51.0 % Final   MCHC  Date Value Ref Range Status  04/05/2021 33.0 31.5 - 35.7 g/dL Final  06/26/2020 33.1 30.0 - 36.0 g/dL Final   [facility] Range Status  04/05/2021 28.3 26.6 - 33.0 pg Final  06/26/2020 28.8 26.0 - 34.0 pg Final   MCV  Date Value Ref Range Status  04/05/2021 86 79 - 97 fL Final  12/29/2012 85 80 - 100 fL Final   No results found for: "PLTCOUNTKUC", "LABPLAT", "POCPLA" RDW  Date Value Ref Range Status  04/05/2021 12.3 11.6 - 15.4 % Final  12/29/2012 12.6 11.5 - 14.5 % Final         Passed - HBA1C is between 0 and 7.9 and within 180 days    Hgb A1c MFr Bld  Date Value Ref Range Status  11/14/2021 7.2 (H) 4.8 - 5.6 % Final    Comment:             Prediabetes: 5.7 - 6.4          Diabetes: >6.4          Glycemic control for adults with diabetes: <7.0          Passed - Valid encounter within last 6 months    Recent Outpatient Visits           2 weeks ago Erroneous encounter - disregard   [place] [facility] at [facility], Jesse Sans, MD   5 months ago Type II diabetes mellitus with complication [facility])   [place] [facility] Medicine at [facility], Jesse Sans, MD   1 year ago Annual physical exam   [place] [facility] Medicine at [facility], Jesse Sans, MD   1 year ago Type II diabetes mellitus with complication [facility])   [place] and Sports Medicine at [facility], Jesse Sans, MD   1 year ago Essential hypertension   [place] and Sports Medicine at [facility], Jesse Sans, MD               tamsulosin (FLOMAX) 0.4 MG CAPS capsule [Pharmacy Med Name: Tamsulosin HCl 0.4 MG Oral Capsule] 80 capsule 3    Sig: TAKE 1 CAPSULE BY [place]     Urology: Alpha-Adrenergic Blocker Failed - 04/25/2022 10:10 PM      Failed - PSA in normal range and within 360 days    Prostate Specific Ag, Serum  Date Value Ref Range Status  04/05/2021 0.5 0.0 - 4.0 ng/mL Final    Comment:    Roche ECLIA methodology. According to the American Urological Association, Serum PSA should decrease and remain at undetectable levels after radical prostatectomy. The AUA defines biochemical recurrence as an initial PSA value 0.2 ng/mL or greater followed by a subsequent confirmatory PSA value 0.2 ng/mL or greater. Values obtained with different assay methods or kits cannot be used interchangeably. Results cannot be interpreted as absolute evidence of the presence or absence of malignant disease.          Passed - Last BP in normal range    BP Readings from Last 1 Encounters:  11/14/21 138/78         Passed - Valid encounter within last 12 months    Recent Outpatient Visits           2 weeks ago Erroneous encounter - disregard   [place] [facility] at [facility], Jesse Sans, MD   5 months ago Type II diabetes mellitus with complication [facility])   [place] [facility] at [facility], Jesse Sans, MD   1 year ago Annual physical exam   [place] [facility] Primary Care  and Sports Medicine at  [employer], Jesse Sans, MD   1 year ago Type II diabetes mellitus with complication [facility])   [place] [facility] and [facility] Medicine at [facility], Jesse Sans, MD   1 year ago Essential hypertension   [place] [facility] Medicine at [facility], Jesse Sans, MD               rosuvastatin (CRESTOR) 40 MG tablet [Pharmacy Med Name: Rosuvastatin Calcium 40 MG Oral Tablet] 80 tablet 3    Sig: TAKE 1 TABLET BY MOUTH  DAILY     Cardiovascular:  Antilipid - Statins 2 Failed - 04/25/2022 10:10 PM      Failed - Cr in normal range and within 360 days    Creat  Date Value Ref Range Status  02/10/2018 1.04 0.70 - 1.25 mg/dL Final    Comment:    For patients >78 years of age, the reference limit for Creatinine is approximately 13% higher for people identified as African-American. .    Creatinine, Ser  Date Value Ref Range Status  04/05/2021 1.14 0.76 - 1.27 mg/dL Final         Failed - Lipid Panel in normal range within the last 12 months    Cholesterol, Total  Date Value Ref Range Status  04/05/2021 147 100 - 199 mg/dL Final   LDL Cholesterol (Calc)  Date Value Ref Range Status  02/10/2018 86 mg/dL (calc) Final    Comment:    Reference range: <100 . Desirable range <100 mg/dL for primary prevention;   <70 mg/dL for patients with CHD or diabetic patients  with > or = 2 CHD risk factors. Marland Kitchen LDL-C is now calculated using the Martin-Hopkins  calculation, which is a validated novel method providing  better accuracy than the Friedewald equation in the  estimation of LDL-C.  Cresenciano Genre et al. Annamaria Helling. 0947;096(28): 2061-2068  (http://education.[employer].com/faq/FAQ164)    LDL Chol Calc (NIH)  Date Value Ref Range Status  04/05/2021 90 0 - 99 mg/dL Final   HDL  Date Value Ref Range Status  04/05/2021 39 (L) >39 mg/dL Final   Triglycerides  Date Value Ref Range Status  04/05/2021 94 0 - 149 mg/dL Final          Passed - Patient is not pregnant      Passed - Valid encounter within last 12 months    Recent Outpatient Visits           2 weeks ago Erroneous encounter - disregard   [place] [facility] at [facility] Jesse Sans, MD   5 months ago Type II diabetes mellitus with complication [facility])   [place] [facility] Care and Sports Medicine at [facility], Jesse Sans, MD   1 year ago Annual physical exam   [place] [facility] Care and Sports Medicine at [facility], Jesse Sans, MD   1 year ago Type II diabetes mellitus with complication [facility])   [place] [facility] Care and Sports Medicine at [facility], Jesse Sans, MD   1 year ago Essential hypertension   [place] [facility] and Sports Medicine at [facility], Jesse Sans, MD               pantoprazole (PROTONIX) 40 MG tablet [Pharmacy Med Name: Pantoprazole Sodium 40 MG Oral Tablet Delayed Release] 80 tablet 3    Sig: TAKE 1 TABLET BY MOUTH  DAILY     Gastroenterology: Proton Pump Inhibitors Passed - 04/25/2022 10:10 PM      Passed - Valid encounter within last 12 months    Recent Outpatient Visits           2 weeks ago Erroneous encounter - disregard   [place] [facility] Medicine at [facility], Jesse Sans, MD   5 months ago Type II diabetes mellitus with complication [facility])   [place] Primary Care and Sports Medicine at [facility], Jesse Sans, MD   1 year ago Annual physical exam   [place] [facility] Care and Sports Medicine at [facility], Jesse Sans, MD   1 year ago Type II diabetes mellitus with complication [facility])   [place] Primary Care and Sports Medicine at [facility], Jesse Sans, MD   1 year ago Essential hypertension   [place] [facility] Care and Sports Medicine at [facility], Jesse Sans, MD

## 2022-05-04 ENCOUNTER — Other Ambulatory Visit: Payer: Self-pay | Admitting: Internal Medicine

## 2022-05-04 DIAGNOSIS — E118 Type 2 diabetes mellitus with unspecified complications: Secondary | ICD-10-CM

## 2022-05-06 NOTE — Telephone Encounter (Signed)
Requested medication (s) are due for refill today: yes  Requested medication (s) are on the active medication list: yes  Last refill:  11/14/21 #90/1  Future visit scheduled: no  Notes to clinic:  Unable to refill per protocol due to failed labs, no updated results.    Requested Prescriptions  Pending Prescriptions Disp Refills   glipiZIDE (GLUCOTROL XL) 2.5 MG 24 hr tablet [Pharmacy Med Name: glipiZIDE ER 2.5 MG Oral Tablet Extended Release 24 Hour] 80 tablet 3    Sig: TAKE 1 TABLET BY MOUTH DAILY  WITH BREAKFAST     Endocrinology:  Diabetes - Sulfonylureas Failed - 05/04/2022 11:05 PM      Failed - Cr in normal range and within 360 days    Creat  Date Value Ref Range Status  02/10/2018 1.04 0.70 - 1.25 mg/dL Final    Comment:    For patients >60 years of age, the reference limit for Creatinine is approximately 13% higher for people identified as African-American. .    Creatinine, Ser  Date Value Ref Range Status  04/05/2021 1.14 0.76 - 1.27 mg/dL Final         Passed - HBA1C is between 0 and 7.9 and within 180 days    Hgb A1c MFr Bld  Date Value Ref Range Status  11/14/2021 7.2 (H) 4.8 - 5.6 % Final    Comment:             Prediabetes: 5.7 - 6.4          Diabetes: >6.4          Glycemic control for adults with diabetes: <7.0          Passed - Valid encounter within last 6 months    Recent Outpatient Visits           4 weeks ago Erroneous encounter - disregard   [place] [facility] at [facility], Jesse Sans, MD   5 months ago Type II diabetes mellitus with complication [facility])   [place] [facility] at [facility], Jesse Sans, MD   1 year ago Annual physical exam   [place] [facility] at [facility], Jesse Sans, MD   1 year ago Type II diabetes mellitus with complication [facility])   [place] [facility] at [facility], Jesse Sans, MD   1 year ago Essential hypertension    Primary Care and Sports Medicine at [facility], Jesse Sans, MD

## 2022-05-06 NOTE — Telephone Encounter (Signed)
Left message with someone to have him set up appointment for his refills

## 2022-05-10 ENCOUNTER — Encounter: Payer: Self-pay | Admitting: Internal Medicine

## 2022-05-10 ENCOUNTER — Other Ambulatory Visit: Payer: Self-pay | Admitting: Internal Medicine

## 2022-05-10 ENCOUNTER — Ambulatory Visit (INDEPENDENT_AMBULATORY_CARE_PROVIDER_SITE_OTHER): Payer: Medicare Other | Admitting: Internal Medicine

## 2022-05-10 VITALS — BP 126/60 | HR 86 | Ht 70.0 in | Wt 238.0 lb

## 2022-05-10 DIAGNOSIS — I10 Essential (primary) hypertension: Secondary | ICD-10-CM

## 2022-05-10 DIAGNOSIS — E1169 Type 2 diabetes mellitus with other specified complication: Secondary | ICD-10-CM | POA: Diagnosis not present

## 2022-05-10 DIAGNOSIS — E785 Hyperlipidemia, unspecified: Secondary | ICD-10-CM

## 2022-05-10 DIAGNOSIS — Z0000 Encounter for general adult medical examination without abnormal findings: Secondary | ICD-10-CM

## 2022-05-10 DIAGNOSIS — N400 Enlarged prostate without lower urinary tract symptoms: Secondary | ICD-10-CM | POA: Diagnosis not present

## 2022-05-10 DIAGNOSIS — K219 Gastro-esophageal reflux disease without esophagitis: Secondary | ICD-10-CM

## 2022-05-10 DIAGNOSIS — E118 Type 2 diabetes mellitus with unspecified complications: Secondary | ICD-10-CM | POA: Diagnosis not present

## 2022-05-10 DIAGNOSIS — I5032 Chronic diastolic (congestive) heart failure: Secondary | ICD-10-CM | POA: Diagnosis not present

## 2022-05-10 NOTE — Progress Notes (Signed)
Date:  05/10/2022   Name:  Bruce Richardson   DOB:  08-11-1951   MRN:  [id number]   Chief Complaint: Annual Exam Bruce Richardson is a 71 y.o. male who presents today for his Complete Annual Exam. He feels well. He reports exercising. He reports he is sleeping poorly. He has gained some weight that he knows is from overeating.  Colonoscopy: 03/2020 Cologuard  Immunization History  Administered Date(s) Administered   Moderna Sars-Covid-2 Vaccination 11/15/2019, 04/25/2020, 05/23/2020   Health Maintenance Due  Topic Date Due   Diabetic kidney evaluation - GFR measurement  04/05/2022   FOOT EXAM  04/05/2022    Lab Results  Component Value Date   PSA1 0.5 04/05/2021     Hypertension This is a chronic problem. The problem is controlled. Pertinent negatives include no chest pain, headaches, palpitations or shortness of breath. Past treatments include calcium channel blockers and ACE inhibitors. The current treatment provides significant improvement. Hypertensive end-organ damage includes CVA and heart failure. There is no history of kidney disease or CAD/MI.  Hyperlipidemia This is a chronic problem. The problem is uncontrolled. Pertinent negatives include no chest pain, myalgias or shortness of breath. Current antihyperlipidemic treatment includes statins. The current treatment provides moderate improvement of lipids.  Diabetes He presents for his follow-up diabetic visit. He has type 2 diabetes mellitus. Pertinent negatives for hypoglycemia include no dizziness, headaches or nervousness/anxiousness. Pertinent negatives for diabetes include no chest pain and no fatigue. Symptoms are stable. Diabetic complications include a CVA and heart disease. Current diabetic treatments: metformin and glipizide.  Benign Prostatic Hypertrophy This is a chronic problem. The problem is unchanged. Irritative symptoms do not include frequency. Obstructive symptoms include a slower stream. Pertinent negatives  include no chills or dysuria. Past treatments include tamsulosin.    Lab Results  Component Value Date   NA 138 04/05/2021   K 4.7 04/05/2021   CO2 24 04/05/2021   GLUCOSE 184 (H) 04/05/2021   BUN 15 04/05/2021   CREATININE 1.14 04/05/2021   CALCIUM 9.9 04/05/2021   EGFR 69 04/05/2021   GFRNONAA 62 07/12/2020   Lab Results  Component Value Date   CHOL 147 04/05/2021   HDL 39 (L) 04/05/2021   LDLCALC 90 04/05/2021   TRIG 94 04/05/2021   CHOLHDL 3.8 04/05/2021   Lab Results  Component Value Date   TSH 2.879 04/03/2020   Lab Results  Component Value Date   HGBA1C 7.2 (H) 11/14/2021   Lab Results  Component Value Date   WBC 7.2 04/05/2021   HGB 15.0 04/05/2021   HCT 45.4 04/05/2021   MCV 86 04/05/2021   PLT 278 04/05/2021   Lab Results  Component Value Date   ALT 15 04/05/2021   AST 11 04/05/2021   ALKPHOS 81 04/05/2021   BILITOT 0.7 04/05/2021   No results found for: "25OHVITD2", "25OHVITD3", "VD25OH"   Review of Systems  Constitutional:  Negative for appetite change, chills, diaphoresis, fatigue and unexpected weight change.  HENT:  Negative for hearing loss, tinnitus, trouble swallowing and voice change.   Eyes:  Negative for visual disturbance.  Respiratory:  Negative for choking, shortness of breath and wheezing.   Cardiovascular:  Negative for chest pain, palpitations and leg swelling.  Gastrointestinal:  Negative for abdominal pain, blood in stool, constipation and diarrhea.  Genitourinary:  Negative for difficulty urinating, dysuria and frequency.  Musculoskeletal:  Negative for arthralgias, back pain and myalgias.  Skin:  Negative for color change and rash.  Neurological:  Negative for dizziness, syncope and headaches.  Hematological:  Negative for adenopathy.  Psychiatric/Behavioral:  Negative for dysphoric mood and sleep disturbance. The patient is not nervous/anxious.     Patient Active Problem List   Diagnosis Date Noted   Eczema 11/14/2021    BPPV (benign paroxysmal positional vertigo), unspecified laterality 12/24/2020   Chronic diastolic congestive heart failure (HCC) 11/29/2019   Centrilobular emphysema (HCC) 01/26/2019   Aortic atherosclerosis (HCC) 01/26/2019   Echocardiogram shows left ventricular diastolic dysfunction 02/16/2018   Erectile dysfunction 02/16/2018   History of CVA (cerebrovascular accident) without residual deficits 08/13/2017   BPH without obstruction/lower urinary tract symptoms 03/19/2017   Chronic bilateral low back pain 11/14/2016   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   GERD (gastroesophageal reflux disease) 04/11/2016   Obesity 04/11/2016   Allergic rhinitis 08/04/2015   Essential hypertension 07/04/2015   Hyperlipidemia due to type 2 diabetes mellitus (HCC) 07/04/2015   Type II diabetes mellitus with complication (HCC) 07/04/2015    No Known Allergies  Past Surgical History:  Procedure Laterality Date   CIRCUMCISION N/A 07/24/2015   Procedure: CIRCUMCISION ADULT;  Surgeon: Hildred Laser, MD;  Location: [facility] ORS;  Service: Urology;  Laterality: N/A;   EYE SURGERY Right    right eye cataract surgery    SHOULDER ARTHROSCOPY Bilateral    SHOULDER ARTHROSCOPY Right 12/22/2014   Procedure: RIGHT SHOULDER ARTHROSCOPY /DECOMPRESSION/ROTATOR CUFF REPAIR OF RECURRENT ROTATOR CUFF TEAR;  Surgeon: Christena Flake, MD;  Location: [facility] ORS;  Service: Orthopedics;  Laterality: Right;    Social History   Tobacco Use   Smoking status: Former    Packs/day: 1.50    Years: 25.00    Total pack years: 37.50    Types: Cigarettes    Start date: 12/19/2001    Quit date: 07/03/2002    Years since quitting: 19.8   Smokeless tobacco: Former   Tobacco comments:    quit smoking in 06/2001  Vaping Use   Vaping Use: Never used  Substance Use Topics   Alcohol use: No    Alcohol/week: 0.0 standard drinks of alcohol   Drug use: No     Medication list has been reviewed and  updated.  Current Meds  Medication Sig   albuterol (VENTOLIN HFA) 108 (90 Base) MCG/ACT inhaler USE 2 INHALATIONS BY MOUTH  EVERY 6 HOURS AS NEEDED FOR WHEEZING OR SHORTNESS OF  BREATH   amLODipine (NORVASC) 5 MG tablet TAKE 1 TABLET BY MOUTH DAILY   aspirin EC 81 MG tablet Take 1 tablet (81 mg total) by mouth daily.   baclofen (LIORESAL) 20 MG tablet TAKE 1 TABLET BY MOUTH 3 TIMES  DAILY   Blood Glucose Monitoring Suppl (ONE TOUCH ULTRA MINI) w/Device KIT 1 kit by Does not apply route as directed.   chlorhexidine (PERIDEX) 0.12 % solution SMARTSIG:By Mouth   fluticasone (FLONASE) 50 MCG/ACT nasal spray USE 1 SPRAY IN BOTH  NOSTRILS DAILY   glipiZIDE (GLUCOTROL XL) 2.5 MG 24 hr tablet Take 1 tablet (2.5 mg total) by mouth daily with breakfast.   glucose blood test strip Check blood sugar twice daily. Dx.E11.9   lisinopril (ZESTRIL) 40 MG tablet TAKE 1 TABLET BY MOUTH DAILY   meclizine (ANTIVERT) 25 MG tablet Take by mouth.   metFORMIN (GLUCOPHAGE) 1000 MG tablet TAKE 1 TABLET BY MOUTH TWICE  DAILY WITH A MEAL   naproxen (NAPROSYN) 500 MG tablet TAKE 1 TABLET BY MOUTH TWICE  DAILY WITH MEALS  ONETOUCH DELICA LANCETS 94W MISC 1 each by Does not apply route 2 (two) times daily. Dx: E11.9   pantoprazole (PROTONIX) 40 MG tablet TAKE 1 TABLET BY MOUTH  DAILY   rosuvastatin (CRESTOR) 40 MG tablet TAKE 1 TABLET BY MOUTH  DAILY   tamsulosin (FLOMAX) 0.4 MG CAPS capsule TAKE 1 CAPSULE BY MOUTH  DAILY FOR PROSTATE   triamcinolone cream (KENALOG) 0.5 % Apply 1 application. topically 3 (three) times daily. To rash on hands, ears   vitamin B-12 (CYANOCOBALAMIN) 1000 MCG tablet Take 1,000 mcg by mouth daily.       05/10/2022    8:17 AM 11/14/2021    9:44 AM 04/05/2021    8:32 AM 12/25/2020    8:35 AM  GAD 7 : Generalized Anxiety Score  Nervous, Anxious, on Edge 1 1 0 2  Control/stop worrying 0 0 3 3  Worry too much - different things 0 $Remove'2 3 3  'zSicmyV$ Trouble relaxing $RemoveBeforeDE'1 2 2 3  'CYMSKhLHEphnYbS$ Restless $RemoveBeforeD'1 1 2 2  'mmiwcazXGhqdAM$ Easily  annoyed or irritable 0 $RemoveBef'2 3 3  'qvcHeLPFtC$ Afraid - awful might happen 0 0 0 2  Total GAD 7 Score $Remov'3 8 13 18  'ZQeJgI$ Anxiety Difficulty Not difficult at all   Extremely difficult       05/10/2022    8:17 AM 11/14/2021    9:42 AM 10/22/2021    8:13 AM  Depression screen PHQ 2/9  Decreased Interest 0 1 3  Down, Depressed, Hopeless 0 2 3  PHQ - 2 Score 0 3 6  Altered sleeping $RemoveBeforeDE'3 3 1  'LwCaYRFTeKDnPwO$ Tired, decreased energy $RemoveBeforeDE'3 2 1  'kNYbMysRjBoGTPH$ Change in appetite 0 2 0  Feeling bad or failure about yourself  0 1 0  Trouble concentrating 0 1 0  Moving slowly or fidgety/restless 0 0 0  Suicidal thoughts 0 0 0  PHQ-9 Score $RemoveBef'6 12 8  'vkVNFPxuJx$ Difficult doing work/chores Not difficult at all  Not difficult at all    BP Readings from Last 3 Encounters:  05/10/22 126/60  11/14/21 138/78  04/05/21 136/80    Physical Exam Vitals and nursing note reviewed.  Constitutional:      Appearance: Normal appearance. He is well-developed.  HENT:     Head: Normocephalic.     Right Ear: Tympanic membrane, ear canal and external ear normal.     Left Ear: Tympanic membrane, ear canal and external ear normal.     Nose: Nose normal.  Eyes:     Conjunctiva/sclera: Conjunctivae normal.     Pupils: Pupils are equal, round, and reactive to light.  Neck:     Thyroid: No thyromegaly.     Vascular: No carotid bruit.  Cardiovascular:     Rate and Rhythm: Normal rate and regular rhythm.     Heart sounds: Normal heart sounds.  Pulmonary:     Effort: Pulmonary effort is normal.     Breath sounds: Normal breath sounds. No wheezing.  Chest:  Breasts:    Right: No mass.     Left: No mass.  Abdominal:     General: Bowel sounds are normal.     Palpations: Abdomen is soft.     Tenderness: There is no abdominal tenderness.  Musculoskeletal:        General: Normal range of motion.     Cervical back: Normal range of motion and neck supple.  Lymphadenopathy:     Cervical: No cervical adenopathy.  Skin:    General: Skin is warm and dry.  Neurological:  Mental  Status: He is alert and oriented to person, place, and time.     Deep Tendon Reflexes: Reflexes are normal and symmetric.  Psychiatric:        Attention and Perception: Attention normal.        Mood and Affect: Mood normal.        Thought Content: Thought content normal.    Diabetic Foot Exam - Simple   Simple Foot Form Diabetic Foot exam was performed with the following findings: Yes 05/10/2022  8:32 AM  Visual Inspection No deformities, no ulcerations, no other skin breakdown bilaterally: Yes Sensation Testing Intact to touch and monofilament testing bilaterally: Yes Pulse Check Posterior Tibialis and Dorsalis pulse intact bilaterally: Yes Comments      Wt Readings from Last 3 Encounters:  05/10/22 238 lb (108 kg)  11/14/21 233 lb (105.7 kg)  04/05/21 232 lb (105.2 kg)    BP 126/60 (BP Location: Right Arm, Cuff Size: Large)   Pulse 86   Ht $R'5\' 10"'vS$  (1.778 m)   Wt 238 lb (108 kg)   SpO2 96%   BMI 34.15 kg/m   Assessment and Plan: 1. Annual physical exam Exam is normal except for weight. Encourage regular exercise and appropriate dietary changes. Up to date on screenings and immunizations. He declines flu vaccine  2. Essential hypertension Clinically stable exam with well controlled BP. Tolerating medications without side effects at this time. Pt to continue current regimen and low sodium diet; benefits of regular exercise as able discussed. - CBC with Differential/Platelet - Comprehensive metabolic panel  3. Hyperlipidemia due to type 2 diabetes mellitus ([place]) Tolerating statin medication without side effects at this time LDL is not at goal of < 70 on current dose Continue same therapy without change at this time.  Would consider Repatha but he is not happy that he is already taking 9 medications. - Lipid panel  4. Type II diabetes mellitus with complication (HCC) Clinically stable by exam and report without s/s of hypoglycemia. DM complicated by hypertension  and dyslipidemia. Tolerating medications well without side effects or other concerns. Encourage him to check his BS more often and cut back on caloric intake - Hemoglobin A1c - Microalbumin / creatinine urine ratio  5. BPH without obstruction/lower urinary tract symptoms DRE deferred - PSA  6. Chronic diastolic congestive heart failure (HCC) Stable without edema, DOE, chest pain.   Partially dictated using Editor, commissioning. Any errors are unintentional.  Halina Maidens, MD [facility]  05/10/2022

## 2022-05-12 LAB — COMPREHENSIVE METABOLIC PANEL
ALT: 11 IU/L (ref 0–44)
AST: 11 IU/L (ref 0–40)
Albumin/Globulin Ratio: 1.7 (ref 1.2–2.2)
Albumin: 4.3 g/dL (ref 3.8–4.8)
Alkaline Phosphatase: 74 IU/L (ref 44–121)
BUN/Creatinine Ratio: 14 (ref 10–24)
BUN: 16 mg/dL (ref 8–27)
Bilirubin Total: 0.7 mg/dL (ref 0.0–1.2)
CO2: 21 mmol/L (ref 20–29)
Calcium: 9.8 mg/dL (ref 8.6–10.2)
Chloride: 104 mmol/L (ref 96–106)
Creatinine, Ser: 1.15 mg/dL (ref 0.76–1.27)
Globulin, Total: 2.6 g/dL (ref 1.5–4.5)
Glucose: 171 mg/dL — ABNORMAL HIGH (ref 70–99)
Potassium: 4.6 mmol/L (ref 3.5–5.2)
Sodium: 139 mmol/L (ref 134–144)
Total Protein: 6.9 g/dL (ref 6.0–8.5)
eGFR: 68 mL/min/{1.73_m2} (ref >59)

## 2022-05-12 LAB — LIPID PANEL
Chol/HDL Ratio: 3.1 ratio (ref 0.0–5.0)
Cholesterol, Total: 118 mg/dL (ref 100–199)
HDL: 38 mg/dL — ABNORMAL LOW (ref >39)
LDL Chol Calc (NIH): 64 mg/dL (ref 0–99)
Triglycerides: 80 mg/dL (ref 0–149)
VLDL Cholesterol Cal: 16 mg/dL (ref 5–40)

## 2022-05-12 LAB — MICROALBUMIN / CREATININE URINE RATIO
Creatinine, Urine: 213 mg/dL
Microalb/Creat Ratio: 248 mg/g creat — ABNORMAL HIGH (ref 0–29)
Microalbumin, Urine: 527.3 ug/mL

## 2022-05-12 LAB — CBC WITH DIFFERENTIAL/PLATELET
Basophils Absolute: 0.1 10*3/uL (ref 0.0–0.2)
Basos: 1 %
EOS (ABSOLUTE): 0.3 10*3/uL (ref 0.0–0.4)
Eos: 4 %
Hematocrit: 46.8 % (ref 37.5–51.0)
Hemoglobin: 14.9 g/dL (ref 13.0–17.7)
Immature Grans (Abs): 0 10*3/uL (ref 0.0–0.1)
Immature Granulocytes: 0 %
Lymphocytes Absolute: 2.3 10*3/uL (ref 0.7–3.1)
Lymphs: 35 %
MCH: 27.9 pg (ref 26.6–33.0)
MCHC: 31.8 g/dL (ref 31.5–35.7)
MCV: 88 fL (ref 79–97)
Monocytes Absolute: 0.7 10*3/uL (ref 0.1–0.9)
Monocytes: 10 %
Neutrophils Absolute: 3.3 10*3/uL (ref 1.4–7.0)
Neutrophils: 50 %
Platelets: 244 10*3/uL (ref 150–450)
RBC: 5.34 x10E6/uL (ref 4.14–5.80)
RDW: 12.2 % (ref 11.6–15.4)
WBC: 6.6 10*3/uL (ref 3.4–10.8)

## 2022-05-12 LAB — HEMOGLOBIN A1C
Est. average glucose Bld gHb Est-mCnc: 169 mg/dL
Hgb A1c MFr Bld: 7.5 % — ABNORMAL HIGH (ref 4.8–5.6)

## 2022-05-12 LAB — PSA: Prostate Specific Ag, Serum: 0.6 ng/mL (ref 0.0–4.0)

## 2022-05-13 NOTE — Telephone Encounter (Signed)
Requested Prescriptions  Pending Prescriptions Disp Refills  . rosuvastatin (CRESTOR) 40 MG tablet [Pharmacy Med Name: Rosuvastatin Calcium 40 MG Oral Tablet] 80 tablet 3    Sig: TAKE 1 TABLET BY MOUTH  DAILY     Cardiovascular:  Antilipid - Statins 2 Failed - 05/10/2022 10:37 PM      Failed - Cr in normal range and within 360 days    Creat  Date Value Ref Range Status  02/10/2018 1.04 0.70 - 1.25 mg/dL Final    Comment:    For patients >72 years of age, the reference limit for Creatinine is approximately 13% higher for people identified as African-American. .    Creatinine, Ser  Date Value Ref Range Status  05/10/2022 1.15 0.76 - 1.27 mg/dL Final         Failed - Lipid Panel in normal range within the last 12 months    Cholesterol, Total  Date Value Ref Range Status  05/10/2022 118 100 - 199 mg/dL Final   LDL Cholesterol (Calc)  Date Value Ref Range Status  02/10/2018 86 mg/dL (calc) Final    Comment:    Reference range: <100 . Desirable range <100 mg/dL for primary prevention;   <70 mg/dL for patients with CHD or diabetic patients  with > or = 2 CHD risk factors. Marland Kitchen LDL-C is now calculated using the Martin-Hopkins  calculation, which is a validated novel method providing  better accuracy than the Friedewald equation in the  estimation of LDL-C.  Horald Pollen et al. Lenox Ahr. 0867;619(50): 2061-2068  (http://education.[employer].com/faq/FAQ164)    LDL Chol Calc (NIH)  Date Value Ref Range Status  05/10/2022 64 0 - 99 mg/dL Final   HDL  Date Value Ref Range Status  05/10/2022 38 (L) >39 mg/dL Final   Triglycerides  Date Value Ref Range Status  05/10/2022 80 0 - 149 mg/dL Final         Passed - Patient is not pregnant      Passed - Valid encounter within last 12 months    Recent Outpatient Visits          3 days ago Annual physical exam   [place] [facility] at [facility], Nyoka Cowden, MD   1 month ago Erroneous  encounter - disregard   [place] [facility] at [facility], Nyoka Cowden, MD   6 months ago Type II diabetes mellitus with complication [facility])   [place] [facility] at [facility], Nyoka Cowden, MD   1 year ago Annual physical exam   [place] [facility] at [facility], Nyoka Cowden, MD   1 year ago Type II diabetes mellitus with complication [facility])   [place] [facility] at [facility], Nyoka Cowden, MD      Future Appointments            In 3 months Judithann Graves, Nyoka Cowden, MD [facility] Primary Care and Sports Medicine at [facility], [facility]   In 1 year Judithann Graves, Nyoka Cowden, MD [facility] Primary Care and Sports Medicine at [facility], [facility]           . pantoprazole (PROTONIX) 40 MG tablet [Pharmacy Med Name: Pantoprazole Sodium 40 MG Oral Tablet Delayed Release] 80 tablet 3    Sig: TAKE 1 TABLET BY MOUTH  DAILY     Gastroenterology: Proton Pump Inhibitors Passed - 05/10/2022 10:37 PM  Passed - Valid encounter within last 12 months    Recent Outpatient Visits          3 days ago Annual physical exam   [place] [facility] at [facility], Nyoka Cowden, MD   1 month ago Erroneous encounter - disregard   [facility] Primary Care and [facility] Medicine at [facility], Nyoka Cowden, MD   6 months ago Type II diabetes mellitus with complication [facility])   [place] [facility] Medicine at [facility], Nyoka Cowden, MD   1 year ago Annual physical exam   [place] [facility] at [facility], Nyoka Cowden, MD   1 year ago Type II diabetes mellitus with complication [facility])   [place] [facility] and [facility] Medicine at [facility], Nyoka Cowden, MD      Future Appointments            In 3 months Judithann Graves Nyoka Cowden, MD [facility] Primary Care and  Sports Medicine at [facility], [facility]   In 1 year Judithann Graves, Nyoka Cowden, MD [facility] Primary Care and Sports Medicine at [facility], [facility]           . tamsulosin (FLOMAX) 0.4 MG CAPS capsule [Pharmacy Med Name: Tamsulosin HCl 0.4 MG Oral Capsule] 80 capsule 3    Sig: TAKE 1 CAPSULE BY MOUTH  DAILY FOR PROSTATE     Urology: Alpha-Adrenergic Blocker Failed - 05/10/2022 10:37 PM      Failed - PSA in normal range and within 360 days    Prostate Specific Ag, Serum  Date Value Ref Range Status  05/10/2022 0.6 0.0 - 4.0 ng/mL Final    Comment:    Roche ECLIA methodology. According to the American Urological Association, Serum PSA should decrease and remain at undetectable levels after radical prostatectomy. The AUA defines biochemical recurrence as an initial PSA value 0.2 ng/mL or greater followed by a subsequent confirmatory PSA value 0.2 ng/mL or greater. Values obtained with different assay methods or kits cannot be used interchangeably. Results cannot be interpreted as absolute evidence of the presence or absence of malignant disease.          Passed - Last BP in normal range    BP Readings from Last 1 Encounters:  05/10/22 126/60         Passed - Valid encounter within last 12 months    Recent Outpatient Visits          3 days ago Annual physical exam   [place] [facility] at [facility], Nyoka Cowden, MD   1 month ago Erroneous encounter - disregard   [place] [facility] Medicine at [facility], Nyoka Cowden, MD   6 months ago Type II diabetes mellitus with complication [facility])   [place] [facility] Medicine at [facility], Nyoka Cowden, MD   1 year ago Annual physical exam   [place] [facility] at [facility], Nyoka Cowden, MD   1 year ago Type II diabetes mellitus with complication [facility]   [place] [facility] Care and Sports Medicine at [facility], Nyoka Cowden, MD      Future Appointments            In 3 months Judithann Graves, Nyoka Cowden, MD [facility] Primary Care and Sports Medicine at [facility], [facility]  In 1 year [facility] Melia, Jesse Sans, MD [facility] Primary Care and Sports Medicine at [facility], [facility]

## 2022-05-27 ENCOUNTER — Other Ambulatory Visit: Payer: Self-pay | Admitting: Internal Medicine

## 2022-05-27 DIAGNOSIS — E118 Type 2 diabetes mellitus with unspecified complications: Secondary | ICD-10-CM

## 2022-05-27 DIAGNOSIS — G8929 Other chronic pain: Secondary | ICD-10-CM

## 2022-05-28 NOTE — Telephone Encounter (Signed)
Requested Prescriptions  Pending Prescriptions Disp Refills  . baclofen (LIORESAL) 20 MG tablet [Pharmacy Med Name: BACLOFEN  $RemoveB'20MG'[id number]$   TAB] 270 tablet 1    Sig: TAKE 1 TABLET BY MOUTH 3 TIMES  DAILY     Analgesics:  Muscle Relaxants - baclofen Passed - 05/27/2022  6:06 PM      Passed - Cr in normal range and within 180 days    Creat  Date Value Ref Range Status  02/10/2018 1.04 0.70 - 1.25 mg/dL Final    Comment:    For patients >58 years of age, the reference limit for Creatinine is approximately 13% higher for people identified as African-American. .    Creatinine, Ser  Date Value Ref Range Status  05/10/2022 1.15 0.76 - 1.27 mg/dL Final         Passed - eGFR is 30 or above and within 180 days    GFR, Est African American  Date Value Ref Range Status  02/10/2018 86 > OR = 60 mL/min/1.2m2 Final   GFR calc Af Amer  Date Value Ref Range Status  07/12/2020 71 >59 mL/min/1.73 Final    Comment:    **In accordance with recommendations from the NKF-ASN Task force,**   [facility] is in the process of updating its eGFR calculation to the   2021 CKD-EPI creatinine equation that estimates kidney function   without a race variable.    GFR, Est Non African American  Date Value Ref Range Status  02/10/2018 74 > OR = 60 mL/min/1.42m2 Final   GFR, Estimated  Date Value Ref Range Status  06/26/2020 >60 >60 mL/min Final    Comment:    (NOTE) Calculated using the CKD-EPI Creatinine Equation (2021)    GFR calc non Af Amer  Date Value Ref Range Status  07/12/2020 62 >59 mL/min/1.73 Final   eGFR  Date Value Ref Range Status  05/10/2022 68 >59 mL/min/1.73 Final         Passed - Valid encounter within last 6 months    Recent Outpatient Visits          2 weeks ago Annual physical exam   [place] [facility] at [facility], Jesse Sans, MD   1 month ago Erroneous encounter - disregard   [place] [facility] Care and Sports Medicine at [facility], Jesse Sans, MD   6 months ago Type II diabetes mellitus with complication [facility])   [place] Primary Care and Sports Medicine at [facility], Jesse Sans, MD   1 year ago Annual physical exam   [place] Primary Care and Sports Medicine at [facility], Jesse Sans, MD   1 year ago Type II diabetes mellitus with complication [facility])    Primary Care and Sports Medicine at [facility], Jesse Sans, MD      Future Appointments            In 3 months Army Melia, Jesse Sans, MD [facility] Primary Care and Sports Medicine at [facility], [facility]   In 11 months Army Melia, Jesse Sans, MD [facility] Primary Care and Sports Medicine at [facility], [facility]           . glipiZIDE (GLUCOTROL XL) 2.5 MG 24 hr tablet [Pharmacy Med Name: glipiZIDE ER 2.5 MG Oral Tablet Extended Release 24 Hour] 90 tablet 1    Sig: TAKE 1 TABLET BY MOUTH DAILY  WITH BREAKFAST     Endocrinology:  Diabetes - Sulfonylureas  Passed - 05/27/2022  6:06 PM      Passed - HBA1C is between 0 and 7.9 and within 180 days    Hgb A1c MFr Bld  Date Value Ref Range Status  05/10/2022 7.5 (H) 4.8 - 5.6 % Final    Comment:             Prediabetes: 5.7 - 6.4          Diabetes: >6.4          Glycemic control for adults with diabetes: <7.0          Passed - Cr in normal range and within 360 days    Creat  Date Value Ref Range Status  02/10/2018 1.04 0.70 - 1.25 mg/dL Final    Comment:    For patients >90 years of age, the reference limit for Creatinine is approximately 13% higher for people identified as African-American. .    Creatinine, Ser  Date Value Ref Range Status  05/10/2022 1.15 0.76 - 1.27 mg/dL Final         Passed - Valid encounter within last 6 months    Recent Outpatient Visits          2 weeks ago Annual physical exam   [place] [facility] at [facility], Jesse Sans, MD   1 month ago Erroneous encounter - disregard    [place] Primary Care and Sports Medicine at [facility], Jesse Sans, MD   6 months ago Type II diabetes mellitus with complication [facility])   [place] Primary Care and Sports Medicine at [facility], Jesse Sans, MD   1 year ago Annual physical exam    Primary Care and Sports Medicine at [facility], Jesse Sans, MD   1 year ago Type II diabetes mellitus with complication [facility])    Primary Care and Sports Medicine at [facility], Jesse Sans, MD      Future Appointments            In 3 months Army Melia, Jesse Sans, MD [facility] Primary Care and Sports Medicine at [facility], [facility]   In 11 months Army Melia, Jesse Sans, MD [place] Primary Care and Sports Medicine at [facility], [facility]

## 2022-06-05 DIAGNOSIS — E119 Type 2 diabetes mellitus without complications: Secondary | ICD-10-CM | POA: Diagnosis not present

## 2022-06-05 DIAGNOSIS — H1045 Other chronic allergic conjunctivitis: Secondary | ICD-10-CM | POA: Diagnosis not present

## 2022-06-05 DIAGNOSIS — H2512 Age-related nuclear cataract, left eye: Secondary | ICD-10-CM | POA: Diagnosis not present

## 2022-06-05 DIAGNOSIS — H5203 Hypermetropia, bilateral: Secondary | ICD-10-CM | POA: Diagnosis not present

## 2022-06-05 LAB — HM DIABETES EYE EXAM

## 2022-06-11 ENCOUNTER — Encounter: Payer: Self-pay | Admitting: Internal Medicine

## 2022-07-10 ENCOUNTER — Other Ambulatory Visit: Payer: Self-pay | Admitting: Internal Medicine

## 2022-07-10 DIAGNOSIS — I10 Essential (primary) hypertension: Secondary | ICD-10-CM

## 2022-07-10 NOTE — Telephone Encounter (Signed)
Requested Prescriptions  Pending Prescriptions Disp Refills   amLODipine (NORVASC) 5 MG tablet [Pharmacy Med Name: amLODIPine Besylate 5 MG Oral Tablet] 90 tablet 0    Sig: TAKE 1 TABLET BY MOUTH DAILY     Cardiovascular: Calcium Channel Blockers 2 Passed - 07/10/2022  6:57 AM      Passed - Last BP in normal range    BP Readings from Last 1 Encounters:  05/10/22 126/60         Passed - Last Heart Rate in normal range    Pulse Readings from Last 1 Encounters:  05/10/22 86         Passed - Valid encounter within last 6 months    Recent Outpatient Visits           2 months ago Annual physical exam   [place] Primary Care and Sports Medicine at [facility], Nyoka Cowden, MD   3 months ago Erroneous encounter - disregard   [place] Primary Care and Sports Medicine at [facility], Nyoka Cowden, MD   7 months ago Type II diabetes mellitus with complication [facility])   [place] Primary Care and Sports Medicine at [facility], Nyoka Cowden, MD   1 year ago Annual physical exam   [place] Primary Care and Sports Medicine at [facility], Nyoka Cowden, MD   1 year ago Type II diabetes mellitus with complication [facility])   [place] Primary Care and Sports Medicine at [facility], Nyoka Cowden, MD       Future Appointments             In 2 months Judithann Graves Nyoka Cowden, MD [facility] Primary Care and Sports Medicine at [facility], [facility]   In 10 months Reubin Milan, MD [facility] Primary Care and Sports Medicine at [facility], [facility]             lisinopril (ZESTRIL) 40 MG tablet [Pharmacy Med Name: Lisinopril 40 MG Oral Tablet] 90 tablet 0    Sig: TAKE 1 TABLET BY MOUTH DAILY     Cardiovascular:  ACE Inhibitors Passed - 07/10/2022  6:57 AM      Passed - Cr in normal range and within 180 days    Creat  Date Value Ref Range Status  02/10/2018 1.04 0.70 - 1.25 mg/dL Final    Comment:    For patients >66 years of age,  the reference limit for Creatinine is approximately 13% higher for people identified as African-American. .    Creatinine, Ser  Date Value Ref Range Status  05/10/2022 1.15 0.76 - 1.27 mg/dL Final         Passed - K in normal range and within 180 days    Potassium  Date Value Ref Range Status  05/10/2022 4.6 3.5 - 5.2 mmol/L Final  06/08/2014 4.2 3.5 - 5.1 mmol/L Final         Passed - Patient is not pregnant      Passed - Last BP in normal range    BP Readings from Last 1 Encounters:  05/10/22 126/60         Passed - Valid encounter within last 6 months    Recent Outpatient Visits           2 months ago Annual physical exam    Primary Care and Sports Medicine at [facility], Nyoka Cowden, MD   3 months ago Erroneous encounter - disregard  [place] Primary Care and Sports Medicine at [facility], Nyoka Cowden, MD   7 months ago Type II diabetes mellitus with complication [facility])   [place] Primary Care and Sports Medicine at [facility], Nyoka Cowden, MD   1 year ago Annual physical exam   [place] Primary Care and [facility] at [facility], Nyoka Cowden, MD   1 year ago Type II diabetes mellitus with complication [facility])   [place] Primary Care and Sports Medicine at [facility], Nyoka Cowden, MD       Future Appointments             In 2 months Judithann Graves, Nyoka Cowden, MD [facility] Primary Care and Sports Medicine at [facility], [facility]   In 10 months Judithann Graves, Nyoka Cowden, MD [facility] Primary Care and Sports Medicine at [facility], [facility]

## 2022-08-01 ENCOUNTER — Other Ambulatory Visit: Payer: Self-pay | Admitting: Internal Medicine

## 2022-08-01 DIAGNOSIS — E118 Type 2 diabetes mellitus with unspecified complications: Secondary | ICD-10-CM

## 2022-08-02 NOTE — Telephone Encounter (Signed)
Refused Glipizide refill request because it's being requested too soon.

## 2022-08-18 ENCOUNTER — Other Ambulatory Visit: Payer: Self-pay | Admitting: Internal Medicine

## 2022-08-18 DIAGNOSIS — L309 Dermatitis, unspecified: Secondary | ICD-10-CM

## 2022-08-18 DIAGNOSIS — I10 Essential (primary) hypertension: Secondary | ICD-10-CM

## 2022-08-20 NOTE — Telephone Encounter (Signed)
Requested medication (s) are due for refill today:   Yes  Requested medication (s) are on the active medication list:   Yes  Future visit scheduled:   Yes 09/09/2022   Last ordered: Lisinopril and Amlodipine 07/10/2022 #90, 0 refills;   Kenalog cream 11/14/2021 30 g, 1 refill  Returned because Kenalog is a non delegated refill.     Requested Prescriptions  Pending Prescriptions Disp Refills   triamcinolone cream (KENALOG) 0.5 % [Pharmacy Med Name: TRIAMCINOLONE  0.5%  CRE] 60 g     Sig: APPLY 1 APPLICATION TOPICALLY 3  TIMES DAILY TO RASH ON HANDS AND EARS     Not Delegated - Dermatology:  Corticosteroids Failed - 08/18/2022  2:21 PM      Failed - This refill cannot be delegated      Passed - Valid encounter within last 12 months    Recent Outpatient Visits           3 months ago Annual physical exam   [place] [facility] at [facility], Jesse Sans, MD   4 months ago Erroneous encounter - disregard   [place] Primary Care and Sports Medicine at [facility], Jesse Sans, MD   9 months ago Type II diabetes mellitus with complication [facility])   [place] Primary Care and Sports Medicine at [facility], Jesse Sans, MD   1 year ago Annual physical exam   [place] Primary Care and Sports Medicine at [facility], Jesse Sans, MD   1 year ago Type II diabetes mellitus with complication [facility])    Primary Care and Sports Medicine at [facility], Jesse Sans, MD       Future Appointments             In 2 weeks Army Melia Jesse Sans, MD [facility] Primary [facility] and [facility] at [facility], [facility]   In 8 months Glean Hess, MD [facility] Primary Care and Sports Medicine at [place], [facility]             lisinopril (ZESTRIL) 40 MG tablet [Pharmacy Med Name: Lisinopril 40 MG Oral Tablet] 90 tablet 3    Sig: TAKE 1 TABLET BY MOUTH DAILY     Cardiovascular:  ACE Inhibitors Passed  - 08/18/2022  2:21 PM      Passed - Cr in normal range and within 180 days    Creat  Date Value Ref Range Status  02/10/2018 1.04 0.70 - 1.25 mg/dL Final    Comment:    For patients >30 years of age, the reference limit for Creatinine is approximately 13% higher for people identified as African-American. .    Creatinine, Ser  Date Value Ref Range Status  05/10/2022 1.15 0.76 - 1.27 mg/dL Final         Passed - K in normal range and within 180 days    Potassium  Date Value Ref Range Status  05/10/2022 4.6 3.5 - 5.2 mmol/L Final  06/08/2014 4.2 3.5 - 5.1 mmol/L Final         Passed - Patient is not pregnant      Passed - Last BP in normal range    BP Readings from Last 1 Encounters:  05/10/22 126/60         Passed - Valid encounter within last 6 months    Recent Outpatient Visits           3 months ago  Annual physical exam   [place] [facility] at [facility], Jesse Sans, MD   4 months ago Erroneous encounter - disregard   [facility] at [facility], Jesse Sans, MD   9 months ago Type II diabetes mellitus with complication [facility])   [place] Primary Care and Sports Medicine at [facility], Jesse Sans, MD   1 year ago Annual physical exam   [place] Primary Care and Sports Medicine at [facility], Jesse Sans, MD   1 year ago Type II diabetes mellitus with complication [facility])   [place] Primary Care and Sports Medicine at [facility], Jesse Sans, MD       Future Appointments             In 2 weeks Army Melia Jesse Sans, MD [facility] Primary Care and Sports Medicine at [facility]   In 8 months Army Melia, Jesse Sans, MD [facility] Primary Care and Sports Medicine at [place], [place]             amLODipine (NORVASC) 5 MG tablet [Pharmacy Med Name: amLODIPine Besylate 5 MG Oral Tablet] 90 tablet 3    Sig: TAKE 1 TABLET BY MOUTH DAILY      Cardiovascular: Calcium Channel Blockers 2 Passed - 08/18/2022  2:21 PM      Passed - Last BP in normal range    BP Readings from Last 1 Encounters:  05/10/22 126/60         Passed - Last Heart Rate in normal range    Pulse Readings from Last 1 Encounters:  05/10/22 86         Passed - Valid encounter within last 6 months    Recent Outpatient Visits           3 months ago Annual physical exam   [place] Primary Care and Sports Medicine at [facility], Jesse Sans, MD   4 months ago Erroneous encounter - disregard   [place] Primary Care and Sports Medicine at [facility], Jesse Sans, MD   9 months ago Type II diabetes mellitus with complication [facility])   [place] and Sports Medicine at [facility], Jesse Sans, MD   1 year ago Annual physical exam    Primary Care and Sports Medicine at [facility], Jesse Sans, MD   1 year ago Type II diabetes mellitus with complication [facility])    Primary Care and Sports Medicine at [facility], Jesse Sans, MD       Future Appointments             In 2 weeks Army Melia, Jesse Sans, MD [facility] Primary Care and Sports Medicine at [facility], [facility]   In 8 months Army Melia, Jesse Sans, MD [place] Primary Care and Sports Medicine at [facility], [facility]

## 2022-09-09 ENCOUNTER — Ambulatory Visit: Payer: Medicare Other | Admitting: Internal Medicine

## 2022-09-28 ENCOUNTER — Other Ambulatory Visit: Payer: Self-pay | Admitting: Internal Medicine

## 2022-09-28 DIAGNOSIS — I10 Essential (primary) hypertension: Secondary | ICD-10-CM

## 2022-09-30 NOTE — Telephone Encounter (Signed)
Requested Prescriptions  Pending Prescriptions Disp Refills   lisinopril (ZESTRIL) 40 MG tablet [Pharmacy Med Name: Lisinopril 40 MG Oral Tablet] 90 tablet 0    Sig: TAKE 1 TABLET BY MOUTH DAILY     Cardiovascular:  ACE Inhibitors Passed - 09/28/2022 10:59 PM      Passed - Cr in normal range and within 180 days    Creat  Date Value Ref Range Status  02/10/2018 1.04 0.70 - 1.25 mg/dL Final    Comment:    For patients >73 years of age, the reference limit for Creatinine is approximately 13% higher for people identified as African-American. .    Creatinine, Ser  Date Value Ref Range Status  05/10/2022 1.15 0.76 - 1.27 mg/dL Final         Passed - K in normal range and within 180 days    Potassium  Date Value Ref Range Status  05/10/2022 4.6 3.5 - 5.2 mmol/L Final  06/08/2014 4.2 3.5 - 5.1 mmol/L Final         Passed - Patient is not pregnant      Passed - Last BP in normal range    BP Readings from Last 1 Encounters:  05/10/22 126/60         Passed - Valid encounter within last 6 months    Recent Outpatient Visits           4 months ago Annual physical exam   [place] at [facility], Jesse Sans, MD   5 months ago Erroneous encounter - disregard   [place] at [facility], Jesse Sans, MD   10 months ago Type II diabetes mellitus with complication [facility])   [place] at [facility], Jesse Sans, MD   1 year ago Annual physical exam   [facility] Primary [facility] at [facility], Jesse Sans, MD   1 year ago Type II diabetes mellitus with complication [facility])   [place] at [facility], Jesse Sans, MD       Future Appointments             In 7 months Army Melia Jesse Sans, MD [place] [facility] [place], [facility]             amLODipine  (NORVASC) 5 MG tablet [Pharmacy Med Name: amLODIPine Besylate 5 MG Oral Tablet] 90 tablet 0    Sig: TAKE 1 TABLET BY MOUTH DAILY     Cardiovascular: Calcium Channel Blockers 2 Passed - 09/28/2022 10:59 PM      Passed - Last BP in normal range    BP Readings from Last 1 Encounters:  05/10/22 126/60         Passed - Last Heart Rate in normal range    Pulse Readings from Last 1 Encounters:  05/10/22 86         Passed - Valid encounter within last 6 months    Recent Outpatient Visits           4 months ago Annual physical exam   [place] at [facility], Jesse Sans, MD   5 months ago Erroneous encounter - disregard   [place] at [facility], Jesse Sans, MD   10 months ago Type II diabetes  mellitus with complication [facility])   [place] Primary Care & Sports Medicine at [facility], Jesse Sans, MD   1 year ago Annual physical exam   [facility] at [facility], Jesse Sans, MD   1 year ago Type II diabetes mellitus with complication [facility])   [place] Primary Care & Sports Medicine at [facility], Jesse Sans, MD       Future Appointments             In 7 months Army Melia, Jesse Sans, MD [place] at [facility], [facility]

## 2022-10-11 ENCOUNTER — Telehealth: Payer: Self-pay | Admitting: Internal Medicine

## 2022-10-11 NOTE — Telephone Encounter (Signed)
Contacted [name] to schedule their annual wellness visit. Appointment made for 10/28/2022.  Sherol Dade; Care Guide Ambulatory Clinical [facility] Direct Dial: [phone]

## 2022-10-14 ENCOUNTER — Other Ambulatory Visit: Payer: Self-pay | Admitting: Internal Medicine

## 2022-10-14 DIAGNOSIS — E118 Type 2 diabetes mellitus with unspecified complications: Secondary | ICD-10-CM

## 2022-10-28 ENCOUNTER — Ambulatory Visit: Payer: Medicare Other

## 2022-10-30 ENCOUNTER — Ambulatory Visit (INDEPENDENT_AMBULATORY_CARE_PROVIDER_SITE_OTHER): Payer: Medicare Other

## 2022-10-30 VITALS — BP 138/80 | Ht 70.0 in | Wt 234.8 lb

## 2022-10-30 DIAGNOSIS — Z0000 Encounter for general adult medical examination without abnormal findings: Secondary | ICD-10-CM | POA: Diagnosis not present

## 2022-10-30 NOTE — Progress Notes (Signed)
Subjective:   Bruce Richardson is a 72 y.o. male who presents for Medicare Annual/Subsequent preventive examination.  Review of Systems     Cardiac Risk Factors include: advanced age (>28mn, >>71women);diabetes mellitus;dyslipidemia;hypertension;male gender     Objective:    There were no vitals filed for this visit. There is no height or weight on file to calculate BMI.     10/30/2022    9:08 AM 10/22/2021    8:18 AM 10/18/2020    8:31 AM 06/26/2020    1:53 PM 10/13/2019   10:27 AM 03/17/2019   12:20 PM 03/16/2019    3:24 PM  Advanced Directives  Does Patient Have a Medical Advance Directive? No Yes No No No No No  Type of ASocial research officer, governmentLiving will       Copy of [place] Chart?  No - copy requested       Would patient like information on creating a medical advance directive? No - Patient declined  No - Patient declined  Yes (MAU/Ambulatory/Procedural Areas - Information given) No - Patient declined No - Patient declined    Current Medications (verified) Outpatient Encounter Medications as of 10/30/2022  Medication Sig   albuterol (VENTOLIN HFA) 108 (90 Base) MCG/ACT inhaler USE 2 INHALATIONS BY MOUTH  EVERY 6 HOURS AS NEEDED FOR WHEEZING OR SHORTNESS OF  BREATH   amLODipine (NORVASC) 5 MG tablet TAKE 1 TABLET BY MOUTH DAILY   aspirin EC 81 MG tablet Take 1 tablet (81 mg total) by mouth daily.   baclofen (LIORESAL) 20 MG tablet TAKE 1 TABLET BY MOUTH 3 TIMES  DAILY   Blood Glucose Monitoring Suppl (ONE TOUCH ULTRA MINI) w/Device KIT 1 kit by Does not apply route as directed.   chlorhexidine (PERIDEX) 0.12 % solution SMARTSIG:By Mouth   fluticasone (FLONASE) 50 MCG/ACT nasal spray USE 1 SPRAY IN BOTH  NOSTRILS DAILY   glipiZIDE (GLUCOTROL XL) 2.5 MG 24 hr tablet TAKE 1 TABLET BY MOUTH DAILY  WITH BREAKFAST   glucose blood test strip Check blood sugar twice daily. Dx.E11.9   lisinopril (ZESTRIL) 40 MG tablet TAKE 1 TABLET BY  MOUTH DAILY   metFORMIN (GLUCOPHAGE) 1000 MG tablet TAKE 1 TABLET BY MOUTH TWICE  DAILY WITH A MEAL   ONETOUCH DELICA LANCETS 399991111MISC 1 each by Does not apply route 2 (two) times daily. Dx: E11.9   pantoprazole (PROTONIX) 40 MG tablet TAKE 1 TABLET BY MOUTH  DAILY   rosuvastatin (CRESTOR) 40 MG tablet TAKE 1 TABLET BY MOUTH  DAILY   tamsulosin (FLOMAX) 0.4 MG CAPS capsule TAKE 1 CAPSULE BY MOUTH  DAILY FOR PROSTATE   triamcinolone cream (KENALOG) 0.5 % APPLY 1 APPLICATION TOPICALLY 3  TIMES DAILY TO RASH ON HANDS AND EARS   vitamin B-12 (CYANOCOBALAMIN) 1000 MCG tablet Take 1,000 mcg by mouth daily.   meclizine (ANTIVERT) 25 MG tablet Take by mouth. (Patient not taking: Reported on 10/30/2022)   naproxen (NAPROSYN) 500 MG tablet TAKE 1 TABLET BY MOUTH TWICE  DAILY WITH MEALS (Patient not taking: Reported on 10/30/2022)   No facility-administered encounter medications on file as of 10/30/2022.    Allergies (verified) Patient has no known allergies.   History: Past Medical History:  Diagnosis Date   Arthritis    Bulging lumbar disc    lower back   Complete rotator cuff rupture of left shoulder 299991111  Complication of anesthesia    kept moving while under anesthesia  during colonoscopy, hard to put under.   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   Non-retracting foreskin 07/04/2015   Stroke ([place])    Tendinitis of elbow or forearm 04/26/2015   Past Surgical History:  Procedure Laterality Date   CIRCUMCISION N/A 07/24/2015   Procedure: CIRCUMCISION ADULT;  Surgeon: Nickie Retort, MD;  Location: [facility] ORS;  Service: Urology;  Laterality: N/A;   EYE SURGERY Right    right eye cataract surgery    SHOULDER ARTHROSCOPY Bilateral     SHOULDER ARTHROSCOPY Right 12/22/2014   Procedure: RIGHT SHOULDER ARTHROSCOPY /DECOMPRESSION/ROTATOR CUFF REPAIR OF RECURRENT ROTATOR CUFF TEAR;  Surgeon: Corky Mull, MD;  Location: [facility] ORS;  Service: Orthopedics;  Laterality: Right;   Family History  Problem Relation Age of Onset   Alzheimer's disease Mother    Diabetes Mother    Hypertension Mother    Cancer Father    Heart failure Brother    Diabetes Brother    Hypertension Brother    Heart disease Brother    Prostate cancer Other        unknown   Bladder Cancer Other        unknown   Heart disease Son    Diabetes Sister    Social History   Socioeconomic History   Marital status: Married    Spouse name: Not on file   Number of children: Not on file   Years of education: Not on file   Highest education level: Not on file  Occupational History   Occupation: retired  Tobacco Use   Smoking status: Former    Packs/day: 1.50    Years: 25.00    Total pack years: 37.50    Types: Cigarettes    Start date: 12/19/2001    Quit date: 07/03/2002    Years since quitting: 20.3   Smokeless tobacco: Former   Tobacco comments:    quit smoking in 06/2001  Vaping Use   Vaping Use: Never used  Substance and Sexual Activity   Alcohol use: No    Alcohol/week: 0.0 standard drinks of alcohol   Drug use: No   Sexual activity: Yes    Partners: Female  Other Topics Concern   Not on file  Social History Narrative   Not on file   Social Determinants of Health   Financial Resource Strain: Low Risk  (10/30/2022)   Overall Financial Resource Strain (CARDIA)    Difficulty of Paying Living Expenses: Not very hard  Food Insecurity: No Food Insecurity (10/30/2022)   Hunger Vital Sign    Worried About Running Out of Food in the Last Year: Never true    Ran Out of Food in the Last Year: Never true  Transportation Needs: No Transportation Needs (10/30/2022)   PRAPARE - Hydrologist (Medical): No    Lack of  Transportation (Non-Medical): No  Physical Activity: Inactive (10/30/2022)   Exercise Vital Sign    Days of Exercise per Week: 0 days    Minutes of Exercise per Session: 0 min  Stress: No Stress Concern Present (10/30/2022)   [place]    Feeling of Stress : Only a little  Social Connections: Moderately Isolated (  10/30/2022)   Social Connection and Isolation Panel [NHANES]    Frequency of Communication with Friends and Family: More than three times a week    Frequency of Social Gatherings with Friends and Family: Never    Attends Religious Services: Never    Marine scientist or Organizations: No    Attends Music therapist: Never    Marital Status: Married    Tobacco Counseling Counseling given: Not Answered Tobacco comments: quit smoking in 06/2001   Clinical Intake:  Pre-visit preparation completed: Yes  Pain : No/denies pain     Nutritional Risks: None Diabetes: Yes CBG done?: No Did pt. bring in CBG monitor from home?: No  How often do you need to have someone help you when you read instructions, pamphlets, or other written materials from your doctor or pharmacy?: 1 - Never  Diabetic?yes Nutrition Risk Assessment:  Has the patient had any N/V/D within the last 2 months?  No  Does the patient have any non-healing wounds?  No  Has the patient had any unintentional weight loss or weight gain?  No   Diabetes:  Is the patient diabetic?  Yes  If diabetic, was a CBG obtained today?  No  Did the patient bring in their glucometer from home?  No  How often do you monitor your CBG's? Once per week.   Financial Strains and Diabetes Management:  Are you having any financial strains with the device, your supplies or your medication? No .  Does the patient want to be seen by Chronic Care Management for management of their diabetes?  No  Would the patient like to be referred to a  Nutritionist or for Diabetic Management?  No   Diabetic Exams:  Diabetic Eye Exam: Completed 06/05/22.  Pt has been advised about the importance in completing this exam.  Diabetic Foot Exam: Completed 05/10/22. Pt has been advised about the importance in completing this exam.   Interpreter Needed?: No  Information entered by :: Kirke Shaggy, LPN   Activities of Daily Living    10/30/2022    9:09 AM 11/14/2021    9:44 AM  In your present state of health, do you have any difficulty performing the following activities:  Hearing? 0 0  Vision? 0 0  Difficulty concentrating or making decisions? 0 0  Walking or climbing stairs? 0 0  Dressing or bathing? 0 0  Doing errands, shopping? 0 0  Preparing Food and eating ? N   Using the Toilet? N   In the past six months, have you accidently leaked urine? N   Do you have problems with loss of bowel control? N   Managing your Medications? N   Managing your Finances? N   Housekeeping or managing your Housekeeping? N     Patient Care Team: Glean Hess, MD as PCP - General (Internal Medicine) Anell Barr, [place] (Optometry) Lonell Face, NP as Nurse Practitioner (Neurosurgery) Vladimir Crofts, MD as Consulting Physician (Neurology)  Indicate any recent Medical Services you may have received from other than [facility] providers in the past year (date may be approximate).     Assessment:   This is a routine wellness examination for [place].  Hearing/Vision screen Hearing Screening - Comments:: No aids Vision Screening - Comments:: Wears glasses= Dr.Woodard   Dietary issues and exercise activities discussed: Current Exercise Habits: The patient does not participate in regular exercise at present   Goals Addressed  This Visit's Progress    DIET - EAT MORE FRUITS AND VEGETABLES         Depression Screen    10/30/2022    9:05 AM 05/10/2022    8:17 AM 11/14/2021    9:42 AM 10/22/2021    8:13 AM 04/05/2021    8:31 AM  12/25/2020    8:35 AM 10/18/2020    8:27 AM  PHQ 2/9 Scores  PHQ - 2 Score 1 0 3 6 0 3 3  PHQ- 9 Score '3 6 12 8 8 16 9    '$ Fall Risk    10/30/2022    9:09 AM 05/10/2022    8:18 AM 11/14/2021    9:44 AM 10/22/2021    8:22 AM 04/05/2021    8:30 AM  Fall Risk   Falls in the past year? 0 0 1 0 0  Number falls in past yr: 0 0 0 0 0  Injury with Fall? 0 0 0 1 0  Risk for fall due to : No Fall Risks No Fall Risks No Fall Risks History of fall(s) History of fall(s)  Follow up Falls prevention discussed;Falls evaluation completed Falls evaluation completed Falls evaluation completed Falls prevention discussed     FALL RISK PREVENTION PERTAINING TO THE HOME:  Any stairs in or around the home? Yes  If so, are there any without handrails? No  Home free of loose throw rugs in walkways, pet beds, electrical cords, etc? Yes  Adequate lighting in your home to reduce risk of falls? Yes   ASSISTIVE DEVICES UTILIZED TO PREVENT FALLS:  Life alert? No  Use of a cane, walker or w/c? No  Grab bars in the bathroom? Yes  Shower chair or bench in shower? Yes  Elevated toilet seat or a handicapped toilet? Yes   TIMED UP AND GO:  Was the test performed? Yes .  Length of time to ambulate 10 feet: 4 sec.   Gait steady and fast without use of assistive device  Cognitive Function:    04/12/2015   10:08 AM  MMSE - Mini Mental State Exam  Orientation to time 5  Orientation to Place 5  Registration 3  Attention/ Calculation 5  Recall 3  Language- name 2 objects 2  Language- repeat 1  Language- follow 3 step command 3  Language- read & follow direction 0  Write a sentence 0  Copy design 0  Total score 27        10/13/2019   10:29 AM 02/10/2018    9:03 AM 02/04/2017    9:00 AM  6CIT Screen  What Year? 0 points 0 points 0 points  What month? 0 points 0 points 0 points  What time? 0 points 0 points 0 points  Count back from 20 0 points 0 points 0 points  Months in reverse 0 points 0 points 0  points  Repeat phrase 0 points 0 points 0 points  Total Score 0 points 0 points 0 points    Immunizations Immunization History  Administered Date(s) Administered   Moderna Sars-Covid-2 Vaccination 11/15/2019, 04/25/2020, 05/23/2020    TDAP status: Due, Education has been provided regarding the importance of this vaccine. Advised may receive this vaccine at local pharmacy or Health Dept. Aware to provide a copy of the vaccination record if obtained from local pharmacy or Health Dept. Verbalized acceptance and understanding.  Flu Vaccine status: Declined, Education has been provided regarding the importance of this vaccine but patient still declined. Advised may receive  this vaccine at local pharmacy or Health Dept. Aware to provide a copy of the vaccination record if obtained from local pharmacy or Health Dept. Verbalized acceptance and understanding.  Pneumococcal vaccine status: Declined,  Education has been provided regarding the importance of this vaccine but patient still declined. Advised may receive this vaccine at local pharmacy or Health Dept. Aware to provide a copy of the vaccination record if obtained from local pharmacy or Health Dept. Verbalized acceptance and understanding.   Covid-19 vaccine status: Completed vaccines  Qualifies for Shingles Vaccine? No   Zostavax completed No   Shingrix Completed?: No.    Education has been provided regarding the importance of this vaccine. Patient has been advised to call insurance company to determine out of pocket expense if they have not yet received this vaccine. Advised may also receive vaccine at local pharmacy or Health Dept. Verbalized acceptance and understanding.  Screening Tests Health Maintenance  Topic Date Due   Pneumonia Vaccine 75+ Years old (1 of 2 - PCV) Never done   DTaP/Tdap/Td (1 - Tdap) Never done   COVID-19 Vaccine (4 - 2023-24 season) 04/19/2022   INFLUENZA VACCINE  11/17/2022 (Originally 03/19/2022)   Zoster  Vaccines- Shingrix (1 of 2) 04/05/2026 (Originally 02/16/2000)   HEMOGLOBIN A1C  11/08/2022   Fecal DNA (Cologuard)  04/12/2023   Diabetic kidney evaluation - eGFR measurement  05/11/2023   Diabetic kidney evaluation - Urine ACR  05/11/2023   FOOT EXAM  05/11/2023   OPHTHALMOLOGY EXAM  06/06/2023   Medicare Annual Wellness (AWV)  10/30/2023   Hepatitis C Screening  Completed   HPV VACCINES  Aged Out    Health Maintenance  Health Maintenance Due  Topic Date Due   Pneumonia Vaccine 51+ Years old (1 of 2 - PCV) Never done   DTaP/Tdap/Td (1 - Tdap) Never done   COVID-19 Vaccine (4 - 2023-24 season) 04/19/2022    Colorectal cancer screening: Type of screening: Cologuard. Completed sent off in the mail 2 weeks ago. Repeat every 3 years  Lung Cancer Screening: (Low Dose CT Chest recommended if Age 27-80 years, 30 pack-year currently smoking OR have quit w/in 15years.) does not qualify.   Additional Screening:  Hepatitis C Screening: does qualify; Completed 02/25/17  Vision Screening: Recommended annual ophthalmology exams for early detection of glaucoma and other disorders of the eye. Is the patient up to date with their annual eye exam?  Yes  Who is the provider or what is the name of the office in which the patient attends annual eye exams? Dr.Woodard If pt is not established with a provider, would they like to be referred to a provider to establish care? No .   Dental Screening: Recommended annual dental exams for proper oral hygiene  Community Resource Referral / Chronic Care Management: CRR required this visit?  No   CCM required this visit?  No      Plan:     I have personally reviewed and noted the following in the patient's chart:   Medical and social history Use of alcohol, tobacco or illicit drugs  Current medications and supplements including opioid prescriptions. Patient is not currently taking opioid prescriptions. Functional ability and status Nutritional  status Physical activity Advanced directives List of other physicians Hospitalizations, surgeries, and ER visits in previous 12 months Vitals Screenings to include cognitive, depression, and falls Referrals and appointments  In addition, I have reviewed and discussed with patient certain preventive protocols, quality metrics, and best practice recommendations. A written  personalized care plan for preventive services as well as general preventive health recommendations were provided to patient.     Dionisio David, LPN   [id number]   Nurse Notes: none

## 2022-10-30 NOTE — Patient Instructions (Addendum)
Bruce Richardson , Thank you for taking time to come for your Medicare Wellness Visit. I appreciate your ongoing commitment to your health goals. Please review the following plan we discussed and let me know if I can assist you in the future.   These are the goals we discussed:  Goals      DIET - EAT MORE FRUITS AND VEGETABLES     DIET - INCREASE WATER INTAKE     Recommend continue drinking at least 6-8 glasses of water a day      Exercise     Recommend physical activity at least 3 times per week         This is a list of the screening recommended for you and due dates:  Health Maintenance  Topic Date Due   Pneumonia Vaccine (1 of 2 - PCV) Never done   DTaP/Tdap/Td vaccine (1 - Tdap) Never done   COVID-19 Vaccine (4 - 2023-24 season) 04/19/2022   Flu Shot  11/17/2022*   Zoster (Shingles) Vaccine (1 of 2) 04/05/2026*   Hemoglobin A1C  11/08/2022   Cologuard (Stool DNA test)  04/12/2023   Yearly kidney function blood test for diabetes  05/11/2023   Yearly kidney health urinalysis for diabetes  05/11/2023   Complete foot exam   05/11/2023   Eye exam for diabetics  06/06/2023   Medicare Annual Wellness Visit  10/30/2023   Hepatitis C Screening: USPSTF Recommendation to screen - Ages 18-79 yo.  Completed   HPV Vaccine  Aged Out  *Topic was postponed. The date shown is not the original due date.    Advanced directives: no  Conditions/risks identified: none  Next appointment: Follow up in one year for your annual wellness visit. 11/05/23 @ 8:15 am by phone  Preventive Care 65 Years and Older, Male  Preventive care refers to lifestyle choices and visits with your health care provider that can promote health and wellness. What does preventive care include? A yearly physical exam. This is also called an annual well check. Dental exams once or twice a year. Routine eye exams. Ask your health care provider how often you should have your eyes checked. Personal lifestyle choices,  including: Daily care of your teeth and gums. Regular physical activity. Eating a healthy diet. Avoiding tobacco and drug use. Limiting alcohol use. Practicing safe sex. Taking low doses of aspirin every day. Taking vitamin and mineral supplements as recommended by your health care provider. What happens during an annual well check? The services and screenings done by your health care provider during your annual well check will depend on your age, overall health, lifestyle risk factors, and family history of disease. Counseling  Your health care provider may ask you questions about your: Alcohol use. Tobacco use. Drug use. Emotional well-being. Home and relationship well-being. Sexual activity. Eating habits. History of falls. Memory and ability to understand (cognition). Work and work Statistician. Screening  You may have the following tests or measurements: Height, weight, and BMI. Blood pressure. Lipid and cholesterol levels. These may be checked every 5 years, or more frequently if you are over 23 years old. Skin check. Lung cancer screening. You may have this screening every year starting at age 30 if you have a 30-pack-year history of smoking and currently smoke or have quit within the past 15 years. Fecal occult blood test (FOBT) of the stool. You may have this test every year starting at age 37. Flexible sigmoidoscopy or colonoscopy. You may have a sigmoidoscopy every 5  years or a colonoscopy every 10 years starting at age 26. Prostate cancer screening. Recommendations will vary depending on your family history and other risks. Hepatitis C blood test. Hepatitis B blood test. Sexually transmitted disease (STD) testing. Diabetes screening. This is done by checking your blood sugar (glucose) after you have not eaten for a while (fasting). You may have this done every 1-3 years. Abdominal aortic aneurysm (AAA) screening. You may need this if you are a current or former  smoker. Osteoporosis. You may be screened starting at age 17 if you are at high risk. Talk with your health care provider about your test results, treatment options, and if necessary, the need for more tests. Vaccines  Your health care provider may recommend certain vaccines, such as: Influenza vaccine. This is recommended every year. Tetanus, diphtheria, and acellular pertussis (Tdap, Td) vaccine. You may need a Td booster every 10 years. Zoster vaccine. You may need this after age 52. Pneumococcal 13-valent conjugate (PCV13) vaccine. One dose is recommended after age 73. Pneumococcal polysaccharide (PPSV23) vaccine. One dose is recommended after age 53. Talk to your health care provider about which screenings and vaccines you need and how often you need them. This information is not intended to replace advice given to you by your health care provider. Make sure you discuss any questions you have with your health care provider. Document Released: 09/01/2015 Document Revised: 04/24/2016 Document Reviewed: 06/06/2015 Elsevier Interactive Patient Education  2017 [employer] Prevention in the Home Falls can cause injuries. They can happen to people of all ages. There are many things you can do to make your home safe and to help prevent falls. What can I do on the outside of my home? Regularly fix the edges of walkways and driveways and fix any cracks. Remove anything that might make you trip as you walk through a door, such as a raised step or threshold. Trim any bushes or trees on the path to your home. Use bright outdoor lighting. Clear any walking paths of anything that might make someone trip, such as rocks or tools. Regularly check to see if handrails are loose or broken. Make sure that both sides of any steps have handrails. Any raised decks and porches should have guardrails on the edges. Have any leaves, snow, or ice cleared regularly. Use sand or salt on walking paths during  winter. Clean up any spills in your garage right away. This includes oil or grease spills. What can I do in the bathroom? Use night lights. Install grab bars by the toilet and in the tub and shower. Do not use towel bars as grab bars. Use non-skid mats or decals in the tub or shower. If you need to sit down in the shower, use a plastic, non-slip stool. Keep the floor dry. Clean up any water that spills on the floor as soon as it happens. Remove soap buildup in the tub or shower regularly. Attach bath mats securely with double-sided non-slip rug tape. Do not have throw rugs and other things on the floor that can make you trip. What can I do in the bedroom? Use night lights. Make sure that you have a light by your bed that is easy to reach. Do not use any sheets or blankets that are too big for your bed. They should not hang down onto the floor. Have a firm chair that has side arms. You can use this for support while you get dressed. Do not have throw rugs  and other things on the floor that can make you trip. What can I do in the kitchen? Clean up any spills right away. Avoid walking on wet floors. Keep items that you use a lot in easy-to-reach places. If you need to reach something above you, use a strong step stool that has a grab bar. Keep electrical cords out of the way. Do not use floor polish or wax that makes floors slippery. If you must use wax, use non-skid floor wax. Do not have throw rugs and other things on the floor that can make you trip. What can I do with my stairs? Do not leave any items on the stairs. Make sure that there are handrails on both sides of the stairs and use them. Fix handrails that are broken or loose. Make sure that handrails are as long as the stairways. Check any carpeting to make sure that it is firmly attached to the stairs. Fix any carpet that is loose or worn. Avoid having throw rugs at the top or bottom of the stairs. If you do have throw rugs,  attach them to the floor with carpet tape. Make sure that you have a light switch at the top of the stairs and the bottom of the stairs. If you do not have them, ask someone to add them for you. What else can I do to help prevent falls? Wear shoes that: Do not have high heels. Have rubber bottoms. Are comfortable and fit you well. Are closed at the toe. Do not wear sandals. If you use a stepladder: Make sure that it is fully opened. Do not climb a closed stepladder. Make sure that both sides of the stepladder are locked into place. Ask someone to hold it for you, if possible. Clearly mark and make sure that you can see: Any grab bars or handrails. First and last steps. Where the edge of each step is. Use tools that help you move around (mobility aids) if they are needed. These include: Canes. Walkers. Scooters. Crutches. Turn on the lights when you go into a dark area. Replace any light bulbs as soon as they burn out. Set up your furniture so you have a clear path. Avoid moving your furniture around. If any of your floors are uneven, fix them. If there are any pets around you, be aware of where they are. Review your medicines with your doctor. Some medicines can make you feel dizzy. This can increase your chance of falling. Ask your doctor what other things that you can do to help prevent falls. This information is not intended to replace advice given to you by your health care provider. Make sure you discuss any questions you have with your health care provider. Document Released: 06/01/2009 Document Revised: 01/11/2016 Document Reviewed: 09/09/2014 Elsevier Interactive Patient Education  2017 [employer].

## 2023-01-22 DIAGNOSIS — Z1212 Encounter for screening for malignant neoplasm of rectum: Secondary | ICD-10-CM | POA: Diagnosis not present

## 2023-01-22 DIAGNOSIS — Z1211 Encounter for screening for malignant neoplasm of colon: Secondary | ICD-10-CM | POA: Diagnosis not present

## 2023-01-28 LAB — EXTERNAL GENERIC LAB PROCEDURE: COLOGUARD: POSITIVE — AB

## 2023-01-28 LAB — COLOGUARD: COLOGUARD: POSITIVE — AB

## 2023-03-02 DIAGNOSIS — E669 Obesity, unspecified: Secondary | ICD-10-CM | POA: Diagnosis not present

## 2023-03-02 DIAGNOSIS — Z87891 Personal history of nicotine dependence: Secondary | ICD-10-CM | POA: Diagnosis not present

## 2023-03-02 DIAGNOSIS — J449 Chronic obstructive pulmonary disease, unspecified: Secondary | ICD-10-CM | POA: Diagnosis not present

## 2023-03-02 DIAGNOSIS — E1136 Type 2 diabetes mellitus with diabetic cataract: Secondary | ICD-10-CM | POA: Diagnosis not present

## 2023-03-02 DIAGNOSIS — M62838 Other muscle spasm: Secondary | ICD-10-CM | POA: Diagnosis not present

## 2023-03-02 DIAGNOSIS — Z79899 Other long term (current) drug therapy: Secondary | ICD-10-CM | POA: Diagnosis not present

## 2023-03-02 DIAGNOSIS — E785 Hyperlipidemia, unspecified: Secondary | ICD-10-CM | POA: Diagnosis not present

## 2023-03-02 DIAGNOSIS — K219 Gastro-esophageal reflux disease without esophagitis: Secondary | ICD-10-CM | POA: Diagnosis not present

## 2023-03-02 DIAGNOSIS — I10 Essential (primary) hypertension: Secondary | ICD-10-CM | POA: Diagnosis not present

## 2023-03-02 DIAGNOSIS — L209 Atopic dermatitis, unspecified: Secondary | ICD-10-CM | POA: Diagnosis not present

## 2023-03-11 ENCOUNTER — Other Ambulatory Visit: Payer: Self-pay

## 2023-03-11 ENCOUNTER — Encounter: Payer: Self-pay | Admitting: Internal Medicine

## 2023-03-11 ENCOUNTER — Ambulatory Visit (INDEPENDENT_AMBULATORY_CARE_PROVIDER_SITE_OTHER): Payer: Medicare HMO | Admitting: Internal Medicine

## 2023-03-11 ENCOUNTER — Telehealth: Payer: Self-pay

## 2023-03-11 VITALS — BP 134/82 | HR 82 | Ht 70.0 in | Wt 229.0 lb

## 2023-03-11 DIAGNOSIS — I10 Essential (primary) hypertension: Secondary | ICD-10-CM

## 2023-03-11 DIAGNOSIS — K219 Gastro-esophageal reflux disease without esophagitis: Secondary | ICD-10-CM

## 2023-03-11 DIAGNOSIS — E785 Hyperlipidemia, unspecified: Secondary | ICD-10-CM

## 2023-03-11 DIAGNOSIS — E1169 Type 2 diabetes mellitus with other specified complication: Secondary | ICD-10-CM | POA: Diagnosis not present

## 2023-03-11 DIAGNOSIS — R195 Other fecal abnormalities: Secondary | ICD-10-CM | POA: Diagnosis not present

## 2023-03-11 DIAGNOSIS — Z1211 Encounter for screening for malignant neoplasm of colon: Secondary | ICD-10-CM

## 2023-03-11 DIAGNOSIS — Z7984 Long term (current) use of oral hypoglycemic drugs: Secondary | ICD-10-CM | POA: Diagnosis not present

## 2023-03-11 DIAGNOSIS — E118 Type 2 diabetes mellitus with unspecified complications: Secondary | ICD-10-CM

## 2023-03-11 DIAGNOSIS — I700 Atherosclerosis of aorta: Secondary | ICD-10-CM | POA: Diagnosis not present

## 2023-03-11 DIAGNOSIS — N400 Enlarged prostate without lower urinary tract symptoms: Secondary | ICD-10-CM

## 2023-03-11 MED ORDER — PANTOPRAZOLE SODIUM 40 MG PO TBEC
40.0000 mg | DELAYED_RELEASE_TABLET | Freq: Every day | ORAL | 3 refills | Status: DC
Start: 2023-03-11 — End: 2023-10-24

## 2023-03-11 MED ORDER — LISINOPRIL 40 MG PO TABS
40.0000 mg | ORAL_TABLET | Freq: Every day | ORAL | 3 refills | Status: DC
Start: 2023-03-11 — End: 2023-10-24

## 2023-03-11 MED ORDER — GLIPIZIDE ER 2.5 MG PO TB24: 2.5000 mg | ORAL_TABLET | Freq: Every day | ORAL | 3 refills | Status: DC

## 2023-03-11 MED ORDER — TAMSULOSIN HCL 0.4 MG PO CAPS
0.4000 mg | ORAL_CAPSULE | Freq: Every day | ORAL | 3 refills | Status: DC
Start: 2023-03-11 — End: 2023-10-24

## 2023-03-11 MED ORDER — GLUCOSE BLOOD VI STRP
ORAL_STRIP | 12 refills | Status: DC
Start: 2023-03-11 — End: 2024-03-31

## 2023-03-11 MED ORDER — AMLODIPINE BESYLATE 5 MG PO TABS: 5.0000 mg | ORAL_TABLET | Freq: Every day | ORAL | 3 refills | Status: DC

## 2023-03-11 MED ORDER — EMPAGLIFLOZIN 25 MG PO TABS
25.0000 mg | ORAL_TABLET | Freq: Every day | ORAL | 0 refills | Status: AC
Start: 2023-03-11 — End: ?

## 2023-03-11 MED ORDER — NA SULFATE-K SULFATE-MG SULF 17.5-3.13-1.6 GM/177ML PO SOLN: 1.0000 | Freq: Once | ORAL | 0 refills | Status: AC

## 2023-03-11 MED ORDER — ROSUVASTATIN CALCIUM 40 MG PO TABS
40.0000 mg | ORAL_TABLET | Freq: Every day | ORAL | 3 refills | Status: DC
Start: 2023-03-11 — End: 2023-10-24

## 2023-03-11 NOTE — Assessment & Plan Note (Addendum)
Blood sugars stable without hypoglycemic symptoms or events. Current regimen is glipizide and metformin. He has heard that metformin is killing black people and wants to change treatment. Changes made last visit are none. Lab Results  Component Value Date   HGBA1C 7.5 (H) 05/10/2022  Will obtain labs today. Stop Metformin but continue glimepiride Start jardiance 25 mg - sample and Rx

## 2023-03-11 NOTE — Assessment & Plan Note (Signed)
On appropriate aspirin and statin therapy

## 2023-03-11 NOTE — Progress Notes (Signed)
Date:  03/11/2023   Name:  Bruce Richardson   DOB:  April 25, 1951   MRN:  [id number]   Chief Complaint: Diabetes, Hypertension, and Hyperlipidemia  Diabetes He presents for his follow-up diabetic visit. He has type 2 diabetes mellitus. His disease course has been stable. Pertinent negatives for hypoglycemia include no headaches or tremors. Pertinent negatives for diabetes include no chest pain, no fatigue, no polydipsia and no polyuria. Diabetic complications include a CVA.  Hypertension This is a chronic problem. The problem is controlled. Pertinent negatives include no chest pain, headaches, palpitations or shortness of breath. Past treatments include ACE inhibitors and calcium channel blockers. The current treatment provides significant improvement. 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 There is no history of kidney disease.  Hyperlipidemia This is a chronic problem. The problem is controlled. Pertinent negatives include no chest pain or shortness of breath. Current antihyperlipidemic treatment includes statins. The current treatment provides significant improvement of lipids.    Lab Results  Component Value Date   NA 139 05/10/2022   K 4.6 05/10/2022   CO2 21 05/10/2022   GLUCOSE 171 (H) 05/10/2022   BUN 16 05/10/2022   CREATININE 1.15 05/10/2022   CALCIUM 9.8 05/10/2022   EGFR 68 05/10/2022   GFRNONAA 62 07/12/2020   Lab Results  Component Value Date   CHOL 118 05/10/2022   HDL 38 (L) 05/10/2022   LDLCALC 64 05/10/2022   TRIG 80 05/10/2022   CHOLHDL 3.1 05/10/2022   Lab Results  Component Value Date   TSH 2.879 04/03/2020   Lab Results  Component Value Date   HGBA1C 7.5 (H) 05/10/2022   Lab Results  Component Value Date   WBC 6.6 05/10/2022   HGB 14.9 05/10/2022   HCT 46.8 05/10/2022   MCV 88 05/10/2022   PLT 244 05/10/2022   Lab Results  Component Value Date   ALT 11 05/10/2022   AST 11 05/10/2022   ALKPHOS 74 05/10/2022   BILITOT 0.7  05/10/2022   No results found for: "25OHVITD2", "25OHVITD3", "VD25OH"   Review of Systems  Constitutional:  Negative for appetite change, fatigue and unexpected weight change.  Eyes:  Negative for visual disturbance.  Respiratory:  Negative for cough, shortness of breath and wheezing.   Cardiovascular:  Negative for chest pain, palpitations and leg swelling.  Gastrointestinal:  Negative for abdominal pain and blood in stool.  Endocrine: Negative for polydipsia and polyuria.  Genitourinary:  Negative for dysuria and hematuria.  Skin:  Negative for color change and rash.  Neurological:  Negative for tremors, numbness and headaches.  Psychiatric/Behavioral:  Negative for dysphoric mood.     Patient Active Problem List   Diagnosis Date Noted   Eczema 11/14/2021   BPPV (benign paroxysmal positional vertigo), unspecified laterality 12/24/2020   Chronic diastolic congestive heart failure (HCC) 11/29/2019   Centrilobular emphysema (HCC) 01/26/2019   Aortic atherosclerosis (HCC) 01/26/2019   Echocardiogram shows left ventricular diastolic dysfunction 02/16/2018   Erectile dysfunction 02/16/2018   History of CVA (cerebrovascular accident) without residual deficits 08/13/2017   BPH without obstruction/lower urinary tract symptoms 03/19/2017   Chronic bilateral low back pain 11/14/2016   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   GERD (gastroesophageal reflux disease) 04/11/2016   Obesity 04/11/2016   Allergic rhinitis 08/04/2015   Essential hypertension 07/04/2015   Hyperlipidemia due to type 2 diabetes mellitus (HCC) 07/04/2015   Type II diabetes mellitus with complication (HCC) 07/04/2015    No Known Allergies  Past Surgical History:  Procedure Laterality Date   CIRCUMCISION N/A 07/24/2015   Procedure: CIRCUMCISION ADULT;  Surgeon: Hildred Laser, MD;  Location: [facility];  Service: Urology;  Laterality: N/A;   EYE SURGERY Right    right eye cataract surgery     SHOULDER ARTHROSCOPY Bilateral    SHOULDER ARTHROSCOPY Right 12/22/2014   Procedure: RIGHT SHOULDER ARTHROSCOPY /DECOMPRESSION/ROTATOR CUFF REPAIR OF RECURRENT ROTATOR CUFF TEAR;  Surgeon: Christena Flake, MD;  Location: [facility];  Service: Orthopedics;  Laterality: Right;    Social History   Tobacco Use   Smoking status: Former    Current packs/day: 0.00    Average packs/day: 1.5 packs/day for 25.0 years (37.6 ttl pk-yrs)    Types: Cigarettes    Start date: 12/19/2001    Quit date: 07/03/2002    Years since quitting: 20.7   Smokeless tobacco: Former   Tobacco comments:    quit smoking in 06/2001  Vaping Use   Vaping status: Never Used  Substance Use Topics   Alcohol use: No    Alcohol/week: 0.0 standard drinks of alcohol   Drug use: No     Medication list has been reviewed and updated.  Current Meds  Medication Sig   albuterol (VENTOLIN HFA) 108 (90 Base) MCG/ACT inhaler USE 2 INHALATIONS BY MOUTH  EVERY 6 HOURS AS NEEDED FOR WHEEZING OR SHORTNESS OF  BREATH   aspirin EC 81 MG tablet Take 1 tablet (81 mg total) by mouth daily.   baclofen (LIORESAL) 20 MG tablet TAKE 1 TABLET BY MOUTH 3 TIMES  DAILY   chlorhexidine (PERIDEX) 0.12 % solution SMARTSIG:By Mouth   empagliflozin (JARDIANCE) 25 MG TABS tablet Take 1 tablet (25 mg total) by mouth daily before breakfast.   fluticasone (FLONASE) 50 MCG/ACT nasal spray USE 1 SPRAY IN BOTH  NOSTRILS DAILY   ONETOUCH DELICA LANCETS 33G MISC 1 each by Does not apply route 2 (two) times daily. Dx: E11.9   [DISCONTINUED] amLODipine (NORVASC) 5 MG tablet TAKE 1 TABLET BY MOUTH DAILY   [DISCONTINUED] Blood Glucose Monitoring Suppl (ONE TOUCH ULTRA MINI) w/Device KIT 1 kit by Does not apply route as directed.   [DISCONTINUED] glipiZIDE (GLUCOTROL XL) 2.5 MG 24 hr tablet TAKE 1 TABLET BY MOUTH DAILY  WITH BREAKFAST   [DISCONTINUED] glucose blood test strip Check blood sugar twice daily. Dx.E11.9   [DISCONTINUED] lisinopril (ZESTRIL) 40 MG tablet  TAKE 1 TABLET BY MOUTH DAILY   [DISCONTINUED] metFORMIN (GLUCOPHAGE) 1000 MG tablet TAKE 1 TABLET BY MOUTH TWICE  DAILY WITH A MEAL   [DISCONTINUED] pantoprazole (PROTONIX) 40 MG tablet TAKE 1 TABLET BY MOUTH  DAILY   [DISCONTINUED] rosuvastatin (CRESTOR) 40 MG tablet TAKE 1 TABLET BY MOUTH  DAILY   [DISCONTINUED] tamsulosin (FLOMAX) 0.4 MG CAPS capsule TAKE 1 CAPSULE BY MOUTH  DAILY FOR PROSTATE       03/11/2023   10:24 AM 05/10/2022    8:17 AM 11/14/2021    9:44 AM 04/05/2021    8:32 AM  GAD 7 : Generalized Anxiety Score  Nervous, Anxious, on Edge 0 1 1 0  Control/stop worrying 0 0 0 3  Worry too much - different things 0 0 2 3  Trouble relaxing 0 1 2 2   Restless 0 1 1 2   Easily annoyed or irritable 0 0 2 3  Afraid - awful might happen 0 0 0 0  Total GAD 7 Score 0 3 8 13   Anxiety Difficulty Not difficult at all Not difficult at  all         03/11/2023   10:23 AM 10/30/2022    9:05 AM 05/10/2022    8:17 AM  Depression screen PHQ 2/9  Decreased Interest 3 0 0  Down, Depressed, Hopeless 3 1 0  PHQ - 2 Score 6 1 0  Altered sleeping 3 1 3   Tired, decreased energy 3 1 3   Change in appetite 1 0 0  Feeling bad or failure about yourself  2 0 0  Trouble concentrating 0 0 0  Moving slowly or fidgety/restless 0 0 0  Suicidal thoughts 0 0 0  PHQ-9 Score 15 3 6   Difficult doing work/chores Somewhat difficult Not difficult at all Not difficult at all    BP Readings from Last 3 Encounters:  03/11/23 134/82  10/30/22 138/80  05/10/22 126/60    Physical Exam Vitals and nursing note reviewed.  Constitutional:      General: He is not in acute distress.    Appearance: He is well-developed.  HENT:     Head: Normocephalic and atraumatic.  Pulmonary:     Effort: Pulmonary effort is normal. No respiratory distress.  Skin:    General: Skin is warm and dry.     Findings: No rash.  Neurological:     Mental Status: He is alert and oriented to person, place, and time.  Psychiatric:         Mood and Affect: Mood normal.        Behavior: Behavior normal.     Wt Readings from Last 3 Encounters:  03/11/23 229 lb (103.9 kg)  10/30/22 234 lb 12.8 oz (106.5 kg)  05/10/22 238 lb (108 kg)    BP 134/82   Pulse 82   Ht 5\' 10"  (1.778 m)   Wt 229 lb (103.9 kg)   SpO2 95%   BMI 32.86 kg/m   Assessment and Plan:  Problem List Items Addressed This Visit     Type II diabetes mellitus with complication (HCC) - Primary (Chronic)    Blood sugars stable without hypoglycemic symptoms or events. Current regimen is glipizide and metformin. He has heard that metformin is killing black people and wants to change treatment. Changes made last visit are none. Lab Results  Component Value Date   HGBA1C 7.5 (H) 05/10/2022  Will obtain labs today. Stop Metformin but continue glimepiride Start jardiance 25 mg - sample and Rx       Relevant Medications   glipiZIDE (GLUCOTROL XL) 2.5 MG 24 hr tablet   lisinopril (ZESTRIL) 40 MG tablet   rosuvastatin (CRESTOR) 40 MG tablet   empagliflozin (JARDIANCE) 25 MG TABS tablet   glucose blood test strip   Other Relevant Orders   Comprehensive metabolic panel   Hemoglobin A1c   Hyperlipidemia due to type 2 diabetes mellitus (HCC) (Chronic)    LDL is  Lab Results  Component Value Date   LDLCALC 64 05/10/2022   Currently being treated with Crestor with good compliance and no concerns.       Relevant Medications   amLODipine (NORVASC) 5 MG tablet   glipiZIDE (GLUCOTROL XL) 2.5 MG 24 hr tablet   lisinopril (ZESTRIL) 40 MG tablet   rosuvastatin (CRESTOR) 40 MG tablet   empagliflozin (JARDIANCE) 25 MG TABS tablet   Essential hypertension (Chronic)    Normal exam with stable BP on amlodipine and lisinopril. No concerns or side effects to current medication. No change in regimen; continue low sodium diet.       Relevant  Medications   amLODipine (NORVASC) 5 MG tablet   lisinopril (ZESTRIL) 40 MG tablet   rosuvastatin (CRESTOR) 40 MG  tablet   BPH without obstruction/lower urinary tract symptoms (Chronic)   Relevant Medications   tamsulosin (FLOMAX) 0.4 MG CAPS capsule   Aortic atherosclerosis (HCC) (Chronic)    On appropriate aspirin and statin therapy      Relevant Medications   amLODipine (NORVASC) 5 MG tablet   lisinopril (ZESTRIL) 40 MG tablet   rosuvastatin (CRESTOR) 40 MG tablet   Other Visit Diagnoses     Long term current use of oral hypoglycemic drug       Positive colorectal cancer screening using Cologuard test       test kit sent to pt by insurance but did not follow up on positive result pt advised - will refer for colonoscopy   Relevant Orders   Ambulatory referral to Gastroenterology   Gastroesophageal reflux disease       Relevant Medications   pantoprazole (PROTONIX) 40 MG tablet       No follow-ups on file.    Reubin Milan, MD [facility] [place]

## 2023-03-11 NOTE — Assessment & Plan Note (Signed)
Normal exam with stable BP on amlodipine and lisinopril. No concerns or side effects to current medication. No change in regimen; continue low sodium diet.

## 2023-03-11 NOTE — Assessment & Plan Note (Signed)
LDL is  Lab Results  Component Value Date   LDLCALC 64 05/10/2022   Currently being treated with Crestor with good compliance and no concerns.

## 2023-03-11 NOTE — Telephone Encounter (Signed)
Gastroenterology Pre-Procedure Review  Request Date: 04/24/23 Requesting Physician: Dr. Allegra Lai  PATIENT REVIEW QUESTIONS: The patient responded to the following health history questions as indicated:    1. Are you having any GI issues? yes (positive cologuard.  1st colonoscopy.) 2. Do you have a personal history of Polyps? no 3. Do you have a family history of Colon Cancer or Polyps? no 4. Diabetes Mellitus? yes (has been advised to stop jardiance 3 days prior to colonoscopy.  Stop glipizide 1 day prior to colonoscopy) 5. Joint replacements in the past 12 months?no 6. Major health problems in the past 3 months?no 7. Any artificial heart valves, MVP, or defibrillator?no    MEDICATIONS & ALLERGIES:    Patient reports the following regarding taking any anticoagulation/antiplatelet therapy:   Plavix, Coumadin, Eliquis, Xarelto, Lovenox, Pradaxa, Brilinta, or Effient? no Aspirin? yes (81mg  daily)  Patient confirms/reports the following medications:  Current Outpatient Medications  Medication Sig Dispense Refill   Na Sulfate-K Sulfate-Mg Sulf 17.5-3.13-1.6 GM/177ML SOLN Take 1 kit by mouth once for 1 dose. 354 mL 0   albuterol (VENTOLIN HFA) 108 (90 Base) MCG/ACT inhaler USE 2 INHALATIONS BY MOUTH  EVERY 6 HOURS AS NEEDED FOR WHEEZING OR SHORTNESS OF  BREATH 34 g 2   amLODipine (NORVASC) 5 MG tablet Take 1 tablet (5 mg total) by mouth daily. 90 tablet 3   aspirin EC 81 MG tablet Take 1 tablet (81 mg total) by mouth daily.     baclofen (LIORESAL) 20 MG tablet TAKE 1 TABLET BY MOUTH 3 TIMES  DAILY 270 tablet 1   chlorhexidine (PERIDEX) 0.12 % solution SMARTSIG:By Mouth     empagliflozin (JARDIANCE) 25 MG TABS tablet Take 1 tablet (25 mg total) by mouth daily before breakfast. 90 tablet 0   fluticasone (FLONASE) 50 MCG/ACT nasal spray USE 1 SPRAY IN BOTH  NOSTRILS DAILY 32 g 1   glipiZIDE (GLUCOTROL XL) 2.5 MG 24 hr tablet Take 1 tablet (2.5 mg total) by mouth daily with breakfast. 90 tablet 3    glucose blood test strip Check blood sugar twice daily. Dx.E11.9 100 each 12   lisinopril (ZESTRIL) 40 MG tablet Take 1 tablet (40 mg total) by mouth daily. 90 tablet 3   ONETOUCH DELICA LANCETS 33G MISC 1 each by Does not apply route 2 (two) times daily. Dx: E11.9 100 each 11   pantoprazole (PROTONIX) 40 MG tablet Take 1 tablet (40 mg total) by mouth daily. 90 tablet 3   rosuvastatin (CRESTOR) 40 MG tablet Take 1 tablet (40 mg total) by mouth daily. 90 tablet 3   tamsulosin (FLOMAX) 0.4 MG CAPS capsule Take 1 capsule (0.4 mg total) by mouth daily after supper. 90 capsule 3   No current facility-administered medications for this visit.    Patient confirms/reports the following allergies:  No Known Allergies  No orders of the defined types were placed in this encounter.   AUTHORIZATION INFORMATION Primary Insurance: 1D#: Group #:  Secondary Insurance: 1D#: Group #:  SCHEDULE INFORMATION: Date: 04/24/23 Time: Location: [facility]

## 2023-03-11 NOTE — Patient Instructions (Addendum)
Stop the Metformin  Start Jardiance 25 mg once a day

## 2023-03-12 ENCOUNTER — Telehealth: Payer: Self-pay | Admitting: Internal Medicine

## 2023-03-12 LAB — COMPREHENSIVE METABOLIC PANEL
ALT: 13 IU/L (ref 0–44)
AST: 14 IU/L (ref 0–40)
Albumin: 4.3 g/dL (ref 3.8–4.8)
Alkaline Phosphatase: 77 IU/L (ref 44–121)
BUN/Creatinine Ratio: 8 — ABNORMAL LOW (ref 10–24)
BUN: 10 mg/dL (ref 8–27)
Bilirubin Total: 0.7 mg/dL (ref 0.0–1.2)
CO2: 24 mmol/L (ref 20–29)
Calcium: 9.5 mg/dL (ref 8.6–10.2)
Chloride: 102 mmol/L (ref 96–106)
Creatinine, Ser: 1.27 mg/dL (ref 0.76–1.27)
Globulin, Total: 2.9 g/dL (ref 1.5–4.5)
Glucose: 182 mg/dL — ABNORMAL HIGH (ref 70–99)
Potassium: 4.6 mmol/L (ref 3.5–5.2)
Sodium: 140 mmol/L (ref 134–144)
Total Protein: 7.2 g/dL (ref 6.0–8.5)
eGFR: 60 mL/min/{1.73_m2} (ref >59)

## 2023-03-12 LAB — HEMOGLOBIN A1C
Est. average glucose Bld gHb Est-mCnc: 197 mg/dL
Hgb A1c MFr Bld: 8.5 % — ABNORMAL HIGH (ref 4.8–5.6)

## 2023-03-12 NOTE — Telephone Encounter (Signed)
Called and could not reach pt at this time. We do not have glipizide samples. [facility] can inform patient if he returns call.  - Baily Hovanec

## 2023-03-12 NOTE — Telephone Encounter (Signed)
Pt. Reports his mail order for glipizide will not arrive until 03/21/23. Pt. Asking if he can have more samples to last until then. Please advise pt.

## 2023-03-17 ENCOUNTER — Other Ambulatory Visit: Payer: Self-pay

## 2023-03-17 ENCOUNTER — Telehealth: Payer: Self-pay | Admitting: Internal Medicine

## 2023-03-17 DIAGNOSIS — E118 Type 2 diabetes mellitus with unspecified complications: Secondary | ICD-10-CM

## 2023-03-17 NOTE — Telephone Encounter (Signed)
Medication is costing patient $141 a month. Placed referral for pharmacy to assist patient with getting medication cheaper.   - Kairy Folsom

## 2023-03-17 NOTE — Telephone Encounter (Signed)
Patient called in regards to medication empagliflozin (JARDIANCE) 25 MG TABS tablet, being too expensive and he can not afford this and needs an alternative called in. Please advise.   Patients callback #  [phone]

## 2023-03-17 NOTE — Telephone Encounter (Signed)
Please review.  KP

## 2023-03-24 ENCOUNTER — Encounter: Payer: Self-pay | Admitting: Pharmacist

## 2023-03-24 ENCOUNTER — Other Ambulatory Visit: Payer: Medicare HMO | Admitting: Pharmacist

## 2023-03-24 NOTE — Progress Notes (Signed)
03/24/2023 Name: Bruce Richardson MRN: [id number] DOB: 1951-02-11  Chief Complaint  Patient presents with   Medication Management   Medication Assistance    Bruce Richardson is a 72 y.o. year old male who presented for a telephone visit.   They were referred to the pharmacist by their PCP for assistance in managing medication access.   Speak with both patient and wife.  Subjective:  Care Team: Primary Care Provider: Reubin Milan, MD ; Next Scheduled Visit: 05/13/2023  Medication Access/Adherence  Current Pharmacy:  [facility] - [place], [place] - [facility] AT Portal to Registered [address] [place] [address] Phone: [phone] Fax: [phone]   Patient reports affordability concerns with their medications: Yes  Patient reports access/transportation concerns to their pharmacy: No  Patient reports adherence concerns with their medications:  No    Reports uses weekly pillbox to organize his medications   Diabetes:  Current medications:  - metformin 1000 mg twice daily with meals - glipizide ER 2.5 mg daily with breakfast  Note plan per 03/11/2023 Office Visit note from PCP was to Stop metformin and Start Jardiance 25 mg daily. Patient was provided with Jardiance sample. However, patient reports that he did not make this change (did not start Jardiance sample) because cost for medication through his insurance was unaffordable  Reports currently checks his blood sugar 2-3 times/week  Recalls when last checked non-fasting reading a couple of days ago, reading ~150  Patient denies hypoglycemic s/sx including dizziness, shakiness, sweating.   Statin therapy: rosuvastatin 40 mg daily  Current medication access support: none   Objective:  Lab Results  Component Value Date   HGBA1C 8.5 (H) 03/11/2023    Lab Results  Component Value Date   CREATININE 1.27 03/11/2023   BUN 10 03/11/2023   NA 140  03/11/2023   K 4.6 03/11/2023   CL 102 03/11/2023   CO2 24 03/11/2023    Lab Results  Component Value Date   CHOL 118 05/10/2022   HDL 38 (L) 05/10/2022   LDLCALC 64 05/10/2022   TRIG 80 05/10/2022   CHOLHDL 3.1 05/10/2022    Medications Reviewed Today     Reviewed by Manuela Neptune, RPH-CPP (Pharmacist) on 03/24/23 at 1138  Med List Status: <None>   Medication Order Taking? Sig Documenting Provider Last Dose Status Informant  albuterol (VENTOLIN HFA) 108 (90 Base) MCG/ACT inhaler [id number]  USE 2 INHALATIONS BY MOUTH  EVERY 6 HOURS AS NEEDED FOR WHEEZING OR SHORTNESS OF  BREATH Reubin Milan, MD  Active   amLODipine (NORVASC) 5 MG tablet [id number] Yes Take 1 tablet (5 mg total) by mouth daily. Reubin Milan, MD Taking Active   aspirin EC 81 MG tablet [id number] Yes Take 1 tablet (81 mg total) by mouth daily. Smitty Cords, DO Taking Active Self  baclofen (LIORESAL) 20 MG tablet [id number] No TAKE 1 TABLET BY MOUTH 3 TIMES  DAILY  Patient not taking: Reported on 03/24/2023   Reubin Milan, MD Not Taking Active   empagliflozin (JARDIANCE) 25 MG TABS tablet [id number] No Take 1 tablet (25 mg total) by mouth daily before breakfast.  Patient not taking: Reported on 03/24/2023   Reubin Milan, MD Not Taking Active   fluticasone [facility]) 50 MCG/ACT nasal spray [id number] Yes USE 1 SPRAY IN BOTH  NOSTRILS DAILY Reubin Milan, MD Taking Active   glipiZIDE (GLUCOTROL XL) 2.5 MG 24 hr tablet [id number] Yes  Take 1 tablet (2.5 mg total) by mouth daily with breakfast. Reubin Milan, MD Taking Active   glucose blood test strip [id number]  Check blood sugar twice daily. Dx.E11.9 Reubin Milan, MD  Active   lisinopril (ZESTRIL) 40 MG tablet [id number] Yes Take 1 tablet (40 mg total) by mouth daily. Reubin Milan, MD Taking Active   [facility] LANCETS [address] [place] [id number]  1 each by Does not apply route 2 (two) times daily. Dx: E11.9 Smitty Cords,  DO  Active Self  pantoprazole (PROTONIX) 40 MG tablet [id number] Yes Take 1 tablet (40 mg total) by mouth daily. Reubin Milan, MD Taking Active   rosuvastatin (CRESTOR) 40 MG tablet [id number] Yes Take 1 tablet (40 mg total) by mouth daily. Reubin Milan, MD Taking Active   tamsulosin [facility]) 0.4 MG CAPS capsule [id number] Yes Take 1 capsule (0.4 mg total) by mouth daily after supper. Reubin Milan, MD Taking Active               Assessment/Plan:   Diabetes: - Currently uncontrolled - Reviewed long term cardiovascular and renal outcomes of uncontrolled blood sugar - Reviewed goal A1c, goal fasting, and goal 2 hour post prandial glucose - Reviewed dietary modifications including importance of having regular well-balanced meals while controlling carbohydrate portion sizes - Recommend to check glucose, keep log of results and have this record to review with medical team at future appointments - Based on reported income, patient meets income limit for Extra Help through Social Security. Verbally review with wife how to apply for Extra Help subsidy online. Counsel to expect response regarding approval or denial from Social Security via mail within the next 4 weeks after submitting application online. Advise that even if he is denied, to retain denial letter as this will be needed for applying for manufacturer patient assistance program.  Plan:  1) Patient to complete Extra Help Subsidy application online on Social Security website 2) If patient denied: based on reported income, he would qualify for patient assistance for Jardiance from [facility] and will collaborate with [facility] CPhT and prescriber to aid patient with completing this application  Follow Up Plan: Clinical Pharmacist will follow up with patient/wife by telephone on 04/30/2023 at 10:00 AM   Estelle Grumbles, PharmD, [facility] [phone]

## 2023-03-24 NOTE — Patient Instructions (Signed)
Goals Addressed             This Visit's Progress    Pharmacy Goals       Our goal A1c is less than 7%. This corresponds with fasting sugars less than 130 and 2 hour after meal sugars less than 180. Please keep a log of your results when checking your blood sugar   Our goal bad cholesterol, or LDL, is less than 70. This is why it is important to continue taking your rosuvastatin.  You can access the Social Security application for the Extra Help application online at: [email or web address]  Please watch for a response from Social Security by mail within the next 4 weeks to let you know if this application is approved or denied. For either outcome, please keep this letter for your records.  If your application for Extra Help is denied, we can assist you with applying to receive your Jardiance directly from the manufacturer at no cost.  Estelle Grumbles, PharmD, [facility] [phone]

## 2023-03-28 ENCOUNTER — Encounter: Payer: Self-pay | Admitting: Pharmacist

## 2023-04-07 ENCOUNTER — Telehealth: Payer: Self-pay | Admitting: Internal Medicine

## 2023-04-07 NOTE — Telephone Encounter (Signed)
OPENED IN ERROR

## 2023-04-17 ENCOUNTER — Encounter: Payer: Self-pay | Admitting: Gastroenterology

## 2023-04-18 ENCOUNTER — Telehealth: Payer: Self-pay

## 2023-04-18 NOTE — Telephone Encounter (Signed)
Patients colonoscopy has been canceled due to him not receiving his prep from mail order.  He said he contacted them and they said it was shipped but he never received it.  I offered to send a new prep and he said he didn't want me to do that.  I told him it may have gotten lost in the mail and I will call back in a week or two to see if he received it.  Then we will get him rescheduled.  Thanks, [place], [place]

## 2023-04-24 ENCOUNTER — Encounter: Admission: RE | Payer: Self-pay | Source: Home / Self Care

## 2023-04-24 ENCOUNTER — Ambulatory Visit: Admission: RE | Admit: 2023-04-24 | Payer: Medicare HMO | Source: Home / Self Care | Admitting: Gastroenterology

## 2023-04-24 SURGERY — COLONOSCOPY WITH PROPOFOL
Anesthesia: General

## 2023-04-30 ENCOUNTER — Other Ambulatory Visit: Payer: Medicare HMO | Admitting: Pharmacist

## 2023-05-02 NOTE — Progress Notes (Signed)
Outreach Note  04/30/2023 Name: Bruce Richardson MRN: [id number] DOB: 1951-08-04  Chief Complaint  Patient presents with   Medication Management   Medication Assistance      Bruce Richardson is a 72 y.o. year old male who presented for a telephone visit.   They were referred to the pharmacist by their PCP for assistance in managing medication access.    Speak with patient who asks that I speak with his wife about his Extra Help application, but advises that she is not currently home. Patient asks that I call back.  9/13: Reach patient's wife by telephone today as requested  Spouse reports that she submitted Extra Help application last month as discussed, but has not yet received a response back in mail from [employer] States that she is going to try to login to his Medicare.gov today and/or contact Social Security by telephone to determine if patient approved   Plan:   1) Spouse/patient to follow up with Social Security to determine if application for Extra Help subsidy was approved or denied 2) If patient denied: based on reported income, he would qualify for patient assistance for Jardiance from [facility] and will collaborate with [facility] CPhT and prescriber to aid patient with completing this application   Follow Up Plan:   1) Patient/spouse to follow up with clinical pharmacist when receives a response from Extra Help 2) Clinical Pharmacist will follow up with patient/wife by telephone again within the next 30 days   Estelle Grumbles, PharmD, [facility] [phone]

## 2023-05-02 NOTE — Patient Instructions (Signed)
Goals Addressed             This Visit's Progress    Pharmacy Goals       Our goal A1c is less than 7%. This corresponds with fasting sugars less than 130 and 2 hour after meal sugars less than 180. Please keep a log of your results when checking your blood sugar   Our goal bad cholesterol, or LDL, is less than 70. This is why it is important to continue taking your rosuvastatin.  Please follow up with Social Security at [phone] to determine if patient's application for Extra Help subsidy was approved or denied.  If your application for Extra Help is denied, we can assist you with applying to receive your Jardiance directly from the manufacturer at no cost.  Estelle Grumbles, PharmD, [facility] [phone]

## 2023-05-13 ENCOUNTER — Telehealth: Payer: Self-pay | Admitting: Internal Medicine

## 2023-05-13 ENCOUNTER — Encounter: Payer: Medicare Other | Admitting: Internal Medicine

## 2023-05-13 NOTE — Assessment & Plan Note (Deleted)
Symptoms mild, never on medications Will check PSA; DRE declined

## 2023-05-13 NOTE — Telephone Encounter (Signed)
Copied from CRM [id number]. Topic: General - Other >> May 13, 2023  2:57 PM Santiya F wrote: Reason for CRM: Pt is calling in to inform the office on why he missed his appointment. Pt says he was struggling with Vertigo and was vomiting as well as experiencing diarrhea.

## 2023-05-13 NOTE — Assessment & Plan Note (Deleted)
Blood sugars stable without hypoglycemic symptoms or events. Current regimen is glimepiride and Jardiance. Changes made last visit are stopping metformin due to patient concerns. Lab Results  Component Value Date   HGBA1C 8.5 (H) 03/11/2023

## 2023-05-13 NOTE — Telephone Encounter (Signed)
Noted. Patient rescheduled for 05/20/23.

## 2023-05-13 NOTE — Assessment & Plan Note (Deleted)
Normal exam with stable BP on amlodipine and lisinopril. No concerns or side effects to current medication. No change in regimen; continue low sodium diet.

## 2023-05-13 NOTE — Progress Notes (Deleted)
Date:  05/13/2023   Name:  Bruce Richardson   DOB:  Feb 25, 1951   MRN:  [id number]   Chief Complaint: No chief complaint on file. Bruce Richardson is a 72 y.o. male who presents today for his Complete Annual Exam. He feels {DESC; WELL/FAIRLY WELL/POORLY:18703}. He reports exercising ***. He reports he is sleeping {DESC; WELL/FAIRLY WELL/POORLY:18703}.   Colonoscopy: Cologuard positive 01/2023  Immunization History  Administered Date(s) Administered   Moderna Sars-Covid-2 Vaccination 11/15/2019, 04/25/2020, 05/23/2020   Health Maintenance Due  Topic Date Due   COVID-19 Vaccine (4 - 2023-24 season) 04/20/2023   Diabetic kidney evaluation - Urine ACR  05/11/2023   FOOT EXAM  05/11/2023    Lab Results  Component Value Date   PSA1 0.6 05/10/2022   PSA1 0.5 04/05/2021    Hypertension This is a chronic problem. The problem is controlled. Pertinent negatives include no chest pain, headaches, palpitations or shortness of breath. Past treatments include ACE inhibitors and calcium channel blockers. The current treatment provides significant improvement. Hypertensive end-organ damage includes CVA. There is no history of kidney disease or CAD/MI.  Diabetes He presents for his follow-up diabetic visit. He has type 2 diabetes mellitus. Pertinent negatives for hypoglycemia include no dizziness, headaches or nervousness/anxiousness. Pertinent negatives for diabetes include no chest pain and no fatigue. Diabetic complications include a CVA.  Hyperlipidemia This is a chronic problem. The problem is controlled. Pertinent negatives include no chest pain, myalgias or shortness of breath. Current antihyperlipidemic treatment includes statins.    Lab Results  Component Value Date   NA 140 03/11/2023   K 4.6 03/11/2023   CO2 24 03/11/2023   GLUCOSE 182 (H) 03/11/2023   BUN 10 03/11/2023   CREATININE 1.27 03/11/2023   CALCIUM 9.5 03/11/2023   EGFR 60 03/11/2023   GFRNONAA 62 07/12/2020   Lab Results   Component Value Date   CHOL 118 05/10/2022   HDL 38 (L) 05/10/2022   LDLCALC 64 05/10/2022   TRIG 80 05/10/2022   CHOLHDL 3.1 05/10/2022   Lab Results  Component Value Date   TSH 2.879 04/03/2020   Lab Results  Component Value Date   HGBA1C 8.5 (H) 03/11/2023   Lab Results  Component Value Date   WBC 6.6 05/10/2022   HGB 14.9 05/10/2022   HCT 46.8 05/10/2022   MCV 88 05/10/2022   PLT 244 05/10/2022   Lab Results  Component Value Date   ALT 13 03/11/2023   AST 14 03/11/2023   ALKPHOS 77 03/11/2023   BILITOT 0.7 03/11/2023   No results found for: "25OHVITD2", "25OHVITD3", "VD25OH"   Review of Systems  Constitutional:  Negative for appetite change, chills, diaphoresis, fatigue and unexpected weight change.  HENT:  Negative for hearing loss, tinnitus, trouble swallowing and voice change.   Eyes:  Negative for visual disturbance.  Respiratory:  Negative for choking, shortness of breath and wheezing.   Cardiovascular:  Negative for chest pain, palpitations and leg swelling.  Gastrointestinal:  Negative for abdominal pain, blood in stool, constipation and diarrhea.  Genitourinary:  Negative for difficulty urinating, dysuria and frequency.  Musculoskeletal:  Negative for arthralgias, back pain and myalgias.  Skin:  Negative for color change and rash.  Neurological:  Negative for dizziness, syncope and headaches.  Hematological:  Negative for adenopathy.  Psychiatric/Behavioral:  Negative for dysphoric mood and sleep disturbance. The patient is not nervous/anxious.     Patient Active Problem List   Diagnosis Date Noted   Eczema 11/14/2021  BPPV (benign paroxysmal positional vertigo), unspecified laterality 12/24/2020   Chronic diastolic congestive heart failure (HCC) 11/29/2019   Centrilobular emphysema (HCC) 01/26/2019   Aortic atherosclerosis (HCC) 01/26/2019   Echocardiogram shows left ventricular diastolic dysfunction 02/16/2018   Erectile dysfunction 02/16/2018    History of CVA (cerebrovascular accident) without residual deficits 08/13/2017   BPH without obstruction/lower urinary tract symptoms 03/19/2017   Chronic bilateral low back pain 11/14/2016   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   GERD (gastroesophageal reflux disease) 04/11/2016   Obesity 04/11/2016   Allergic rhinitis 08/04/2015   Essential hypertension 07/04/2015   Hyperlipidemia due to type 2 diabetes mellitus (HCC) 07/04/2015   Type II diabetes mellitus with complication (HCC) 07/04/2015    No Known Allergies  Past Surgical History:  Procedure Laterality Date   CIRCUMCISION N/A 07/24/2015   Procedure: CIRCUMCISION ADULT;  Surgeon: Hildred Laser, MD;  Location: [facility] ORS;  Service: Urology;  Laterality: N/A;   EYE SURGERY Right    right eye cataract surgery    SHOULDER ARTHROSCOPY Bilateral    SHOULDER ARTHROSCOPY Right 12/22/2014   Procedure: RIGHT SHOULDER ARTHROSCOPY /DECOMPRESSION/ROTATOR CUFF REPAIR OF RECURRENT ROTATOR CUFF TEAR;  Surgeon: Christena Flake, MD;  Location: [facility] ORS;  Service: Orthopedics;  Laterality: Right;    Social History   Tobacco Use   Smoking status: Former    Current packs/day: 0.00    Average packs/day: 1.5 packs/day for 25.0 years (37.6 ttl pk-yrs)    Types: Cigarettes    Start date: 12/19/2001    Quit date: 07/03/2002    Years since quitting: 20.8   Smokeless tobacco: Former   Tobacco comments:    quit smoking in 06/2001  Vaping Use   Vaping status: Never Used  Substance Use Topics   Alcohol use: No    Alcohol/week: 0.0 standard drinks of alcohol   Drug use: No     Medication list has been reviewed and updated.  No outpatient medications have been marked as taking for the 05/13/23 encounter (Appointment) with Reubin Milan, MD.       03/11/2023   10:24 AM 05/10/2022    8:17 AM 11/14/2021    9:44 AM 04/05/2021    8:32 AM  GAD 7 : Generalized Anxiety Score  Nervous, Anxious, on Edge 0 1 1 0   Control/stop worrying 0 0 0 3  Worry too much - different things 0 0 2 3  Trouble relaxing 0 1 2 2   Restless 0 1 1 2   Easily annoyed or irritable 0 0 2 3  Afraid - awful might happen 0 0 0 0  Total GAD 7 Score 0 3 8 13   Anxiety Difficulty Not difficult at all Not difficult at all         03/11/2023   10:23 AM 10/30/2022    9:05 AM 05/10/2022    8:17 AM  Depression screen PHQ 2/9  Decreased Interest 3 0 0  Down, Depressed, Hopeless 3 1 0  PHQ - 2 Score 6 1 0  Altered sleeping 3 1 3   Tired, decreased energy 3 1 3   Change in appetite 1 0 0  Feeling bad or failure about yourself  2 0 0  Trouble concentrating 0 0 0  Moving slowly or fidgety/restless 0 0 0  Suicidal thoughts 0 0 0  PHQ-9 Score 15 3 6   Difficult doing work/chores Somewhat difficult Not difficult at all Not difficult at all    BP Readings from Last 3  Encounters:  03/11/23 134/82  10/30/22 138/80  05/10/22 126/60    Physical Exam Vitals and nursing note reviewed.  Constitutional:      Appearance: Normal appearance. He is well-developed.  HENT:     Head: Normocephalic.     Right Ear: Tympanic membrane, ear canal and external ear normal.     Left Ear: Tympanic membrane, ear canal and external ear normal.     Nose: Nose normal.  Eyes:     Conjunctiva/sclera: Conjunctivae normal.     Pupils: Pupils are equal, round, and reactive to light.  Neck:     Thyroid: No thyromegaly.     Vascular: No carotid bruit.  Cardiovascular:     Rate and Rhythm: Normal rate and regular rhythm.     Heart sounds: Normal heart sounds.  Pulmonary:     Effort: Pulmonary effort is normal.     Breath sounds: Normal breath sounds. No wheezing.  Chest:  Breasts:    Right: No mass.     Left: No mass.  Abdominal:     General: Bowel sounds are normal.     Palpations: Abdomen is soft.     Tenderness: There is no abdominal tenderness.  Musculoskeletal:        General: Normal range of motion.     Cervical back: Normal range of  motion and neck supple.  Lymphadenopathy:     Cervical: No cervical adenopathy.  Skin:    General: Skin is warm and dry.  Neurological:     Mental Status: He is alert and oriented to person, place, and time.     Deep Tendon Reflexes: Reflexes are normal and symmetric.  Psychiatric:        Attention and Perception: Attention normal.        Mood and Affect: Mood normal.        Thought Content: Thought content normal.    Diabetic Foot Exam - Simple   No data filed      Wt Readings from Last 3 Encounters:  03/11/23 229 lb (103.9 kg)  10/30/22 234 lb 12.8 oz (106.5 kg)  05/10/22 238 lb (108 kg)    There were no vitals taken for this visit.  Assessment and Plan:  Problem List Items Addressed This Visit       Unprioritized   Type II diabetes mellitus with complication (HCC) (Chronic)    Blood sugars stable without hypoglycemic symptoms or events. Current regimen is glimepiride and Jardiance. Changes made last visit are stopping metformin due to patient concerns. Lab Results  Component Value Date   HGBA1C 8.5 (H) 03/11/2023         Hyperlipidemia due to type 2 diabetes mellitus (HCC) (Chronic)    LDL is  Lab Results  Component Value Date   LDLCALC 64 05/10/2022   Currently being treated with Crestor with good compliance and no concerns.       Essential hypertension (Chronic)    Normal exam with stable BP on amlodipine and lisinopril. No concerns or side effects to current medication. No change in regimen; continue low sodium diet.       BPH without obstruction/lower urinary tract symptoms (Chronic)    Symptoms mild, never on medications Will check PSA; DRE declined      Other Visit Diagnoses     Annual physical exam    -  Primary   Colon cancer screening           No follow-ups on file.    Vernona Rieger  Haynes Kerns, MD [facility] [place]

## 2023-05-13 NOTE — Assessment & Plan Note (Deleted)
LDL is  Lab Results  Component Value Date   LDLCALC 64 05/10/2022   Currently being treated with Crestor with good compliance and no concerns.

## 2023-05-20 ENCOUNTER — Ambulatory Visit (INDEPENDENT_AMBULATORY_CARE_PROVIDER_SITE_OTHER): Payer: Medicare HMO | Admitting: Internal Medicine

## 2023-05-20 ENCOUNTER — Encounter: Payer: Self-pay | Admitting: Internal Medicine

## 2023-05-20 VITALS — BP 124/76 | HR 76 | Ht 70.0 in | Wt 230.0 lb

## 2023-05-20 DIAGNOSIS — Z7984 Long term (current) use of oral hypoglycemic drugs: Secondary | ICD-10-CM | POA: Diagnosis not present

## 2023-05-20 DIAGNOSIS — I10 Essential (primary) hypertension: Secondary | ICD-10-CM | POA: Diagnosis not present

## 2023-05-20 DIAGNOSIS — G8929 Other chronic pain: Secondary | ICD-10-CM | POA: Diagnosis not present

## 2023-05-20 DIAGNOSIS — F5101 Primary insomnia: Secondary | ICD-10-CM | POA: Insufficient documentation

## 2023-05-20 DIAGNOSIS — E785 Hyperlipidemia, unspecified: Secondary | ICD-10-CM | POA: Diagnosis not present

## 2023-05-20 DIAGNOSIS — M545 Low back pain, unspecified: Secondary | ICD-10-CM | POA: Diagnosis not present

## 2023-05-20 DIAGNOSIS — K219 Gastro-esophageal reflux disease without esophagitis: Secondary | ICD-10-CM | POA: Diagnosis not present

## 2023-05-20 DIAGNOSIS — E1169 Type 2 diabetes mellitus with other specified complication: Secondary | ICD-10-CM

## 2023-05-20 DIAGNOSIS — N400 Enlarged prostate without lower urinary tract symptoms: Secondary | ICD-10-CM | POA: Diagnosis not present

## 2023-05-20 DIAGNOSIS — E118 Type 2 diabetes mellitus with unspecified complications: Secondary | ICD-10-CM | POA: Diagnosis not present

## 2023-05-20 MED ORDER — METFORMIN HCL 1000 MG PO TABS
1000.0000 mg | ORAL_TABLET | Freq: Two times a day (BID) | ORAL | 1 refills | Status: DC
Start: 2023-05-20 — End: 2023-10-24

## 2023-05-20 NOTE — Assessment & Plan Note (Signed)
Recent flare up after moving his mattress He is using ice and topical rubs and gradually improving He declined any other medications than oxycodone

## 2023-05-20 NOTE — Progress Notes (Signed)
Date:  05/20/2023   Name:  MACGUIRE HOLSINGER   DOB:  10/14/1950   MRN:  [id number]   Chief Complaint: Annual Exam Bruce Richardson is a 72 y.o. male who presents today for his Complete Annual Exam. He feels well. He reports exercising - some. He reports he is sleeping poorly.   Colonoscopy: Cologuard + 01/2023 -->ref for colonoscopy  Immunization History  Administered Date(s) Administered   Moderna Sars-Covid-2 Vaccination 11/15/2019, 04/25/2020, 05/23/2020   Health Maintenance Due  Topic Date Due   COVID-19 Vaccine (4 - 2023-24 season) 04/20/2023   Diabetic kidney evaluation - Urine ACR  05/11/2023    Lab Results  Component Value Date   PSA1 0.6 05/10/2022   PSA1 0.5 04/05/2021    Hypertension This is a chronic problem. The problem is controlled. Pertinent negatives include no chest pain, headaches, palpitations or shortness of breath. Past treatments include ACE inhibitors and calcium channel blockers. The current treatment provides significant improvement. Hypertensive end-organ damage includes CAD/MI. There is no history of kidney disease or CVA.  Diabetes He presents for his follow-up diabetic visit. He has type 2 diabetes mellitus. Pertinent negatives for hypoglycemia include no dizziness, headaches or nervousness/anxiousness. Pertinent negatives for diabetes include no chest pain and no fatigue. Pertinent negatives for diabetic complications include no CVA.  Hyperlipidemia This is a chronic problem. The problem is controlled. Pertinent negatives include no chest pain, myalgias or shortness of breath. Current antihyperlipidemic treatment includes statins. The current treatment provides significant improvement of lipids.  Gastroesophageal Reflux He complains of heartburn. He reports no abdominal pain, no chest pain, no choking or no wheezing. This is a recurrent problem. The problem occurs rarely. Pertinent negatives include no fatigue. He has tried a PPI for the symptoms.  Back  Pain This is a recurrent problem. The problem has been gradually improving since onset. The pain is present in the lumbar spine (and right side). The pain is moderate. Pertinent negatives include no abdominal pain, chest pain, dysuria or headaches. He has tried analgesics, bed rest, ice, muscle relaxant and NSAIDs for the symptoms. The treatment provided mild relief.    Lab Results  Component Value Date   NA 140 03/11/2023   K 4.6 03/11/2023   CO2 24 03/11/2023   GLUCOSE 182 (H) 03/11/2023   BUN 10 03/11/2023   CREATININE 1.27 03/11/2023   CALCIUM 9.5 03/11/2023   EGFR 60 03/11/2023   GFRNONAA 62 07/12/2020   Lab Results  Component Value Date   CHOL 118 05/10/2022   HDL 38 (L) 05/10/2022   LDLCALC 64 05/10/2022   TRIG 80 05/10/2022   CHOLHDL 3.1 05/10/2022   Lab Results  Component Value Date   TSH 2.879 04/03/2020   Lab Results  Component Value Date   HGBA1C 8.5 (H) 03/11/2023   Lab Results  Component Value Date   WBC 6.6 05/10/2022   HGB 14.9 05/10/2022   HCT 46.8 05/10/2022   MCV 88 05/10/2022   PLT 244 05/10/2022   Lab Results  Component Value Date   ALT 13 03/11/2023   AST 14 03/11/2023   ALKPHOS 77 03/11/2023   BILITOT 0.7 03/11/2023   No results found for: "25OHVITD2", "25OHVITD3", "VD25OH"   Review of Systems  Constitutional:  Negative for appetite change, chills, diaphoresis, fatigue and unexpected weight change.  HENT:  Negative for hearing loss, tinnitus, trouble swallowing and voice change.   Eyes:  Negative for visual disturbance.  Respiratory:  Negative for choking, shortness of  breath and wheezing.   Cardiovascular:  Negative for chest pain, palpitations and leg swelling.  Gastrointestinal:  Positive for heartburn. Negative for abdominal pain, blood in stool, constipation and diarrhea.  Genitourinary:  Negative for difficulty urinating, dysuria and frequency.  Musculoskeletal:  Positive for back pain. Negative for arthralgias and myalgias.   Skin:  Negative for color change and rash.  Neurological:  Negative for dizziness, syncope and headaches.  Hematological:  Negative for adenopathy.  Psychiatric/Behavioral:  Positive for sleep disturbance. Negative for dysphoric mood. The patient is not nervous/anxious.     Patient Active Problem List   Diagnosis Date Noted   Primary insomnia 05/20/2023   Eczema 11/14/2021   BPPV (benign paroxysmal positional vertigo), unspecified laterality 12/24/2020   Chronic diastolic congestive heart failure (HCC) 11/29/2019   Centrilobular emphysema (HCC) 01/26/2019   Aortic atherosclerosis (HCC) 01/26/2019   Echocardiogram shows left ventricular diastolic dysfunction 02/16/2018   Erectile dysfunction 02/16/2018   History of CVA (cerebrovascular accident) without residual deficits 08/13/2017   BPH without obstruction/lower urinary tract symptoms 03/19/2017   Chronic bilateral low back pain 11/14/2016   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   GERD (gastroesophageal reflux disease) 04/11/2016   Obesity 04/11/2016   Allergic rhinitis 08/04/2015   Essential hypertension 07/04/2015   Hyperlipidemia due to type 2 diabetes mellitus (HCC) 07/04/2015   Type II diabetes mellitus with complication (HCC) 07/04/2015    No Known Allergies  Past Surgical History:  Procedure Laterality Date   CIRCUMCISION N/A 07/24/2015   Procedure: CIRCUMCISION ADULT;  Surgeon: Hildred Laser, MD;  Location: [facility] ORS;  Service: Urology;  Laterality: N/A;   EYE SURGERY Right    right eye cataract surgery    SHOULDER ARTHROSCOPY Bilateral    SHOULDER ARTHROSCOPY Right 12/22/2014   Procedure: RIGHT SHOULDER ARTHROSCOPY /DECOMPRESSION/ROTATOR CUFF REPAIR OF RECURRENT ROTATOR CUFF TEAR;  Surgeon: Christena Flake, MD;  Location: [facility] ORS;  Service: Orthopedics;  Laterality: Right;    Social History   Tobacco Use   Smoking status: Former    Current packs/day: 0.00    Average packs/day: 1.5  packs/day for 25.0 years (37.6 ttl pk-yrs)    Types: Cigarettes    Start date: 12/19/2001    Quit date: 07/03/2002    Years since quitting: 20.8   Smokeless tobacco: Former   Tobacco comments:    quit smoking in 06/2001  Vaping Use   Vaping status: Never Used  Substance Use Topics   Alcohol use: No    Alcohol/week: 0.0 standard drinks of alcohol   Drug use: No     Medication list has been reviewed and updated.  Current Meds  Medication Sig   albuterol (VENTOLIN HFA) 108 (90 Base) MCG/ACT inhaler USE 2 INHALATIONS BY MOUTH  EVERY 6 HOURS AS NEEDED FOR WHEEZING OR SHORTNESS OF  BREATH   amLODipine (NORVASC) 5 MG tablet Take 1 tablet (5 mg total) by mouth daily.   aspirin EC 81 MG tablet Take 1 tablet (81 mg total) by mouth daily.   fluticasone (FLONASE) 50 MCG/ACT nasal spray USE 1 SPRAY IN BOTH  NOSTRILS DAILY   glipiZIDE (GLUCOTROL XL) 2.5 MG 24 hr tablet Take 1 tablet (2.5 mg total) by mouth daily with breakfast.   glucose blood test strip Check blood sugar twice daily. Dx.E11.9   lisinopril (ZESTRIL) 40 MG tablet Take 1 tablet (40 mg total) by mouth daily.   ONETOUCH DELICA LANCETS 33G MISC 1 each by Does not apply route 2 (two)  times daily. Dx: E11.9   pantoprazole (PROTONIX) 40 MG tablet Take 1 tablet (40 mg total) by mouth daily.   rosuvastatin (CRESTOR) 40 MG tablet Take 1 tablet (40 mg total) by mouth daily.   tamsulosin (FLOMAX) 0.4 MG CAPS capsule Take 1 capsule (0.4 mg total) by mouth daily after supper.   [DISCONTINUED] metFORMIN (GLUCOPHAGE) 1000 MG tablet Take 1,000 mg by mouth 2 (two) times daily with a meal.       03/11/2023   10:24 AM 05/10/2022    8:17 AM 11/14/2021    9:44 AM 04/05/2021    8:32 AM  GAD 7 : Generalized Anxiety Score  Nervous, Anxious, on Edge 0 1 1 0  Control/stop worrying 0 0 0 3  Worry too much - different things 0 0 2 3  Trouble relaxing 0 1 2 2   Restless 0 1 1 2   Easily annoyed or irritable 0 0 2 3  Afraid - awful might happen 0 0 0 0   Total GAD 7 Score 0 3 8 13   Anxiety Difficulty Not difficult at all Not difficult at all         03/11/2023   10:23 AM 10/30/2022    9:05 AM 05/10/2022    8:17 AM  Depression screen PHQ 2/9  Decreased Interest 3 0 0  Down, Depressed, Hopeless 3 1 0  PHQ - 2 Score 6 1 0  Altered sleeping 3 1 3   Tired, decreased energy 3 1 3   Change in appetite 1 0 0  Feeling bad or failure about yourself  2 0 0  Trouble concentrating 0 0 0  Moving slowly or fidgety/restless 0 0 0  Suicidal thoughts 0 0 0  PHQ-9 Score 15 3 6   Difficult doing work/chores Somewhat difficult Not difficult at all Not difficult at all    BP Readings from Last 3 Encounters:  05/20/23 124/76  03/11/23 134/82  10/30/22 138/80    Physical Exam Vitals and nursing note reviewed.  Constitutional:      Appearance: Normal appearance. He is well-developed.  HENT:     Head: Normocephalic.     Right Ear: Tympanic membrane, ear canal and external ear normal.     Left Ear: Tympanic membrane, ear canal and external ear normal.     Nose: Nose normal.  Eyes:     Conjunctiva/sclera: Conjunctivae normal.     Pupils: Pupils are equal, round, and reactive to light.  Neck:     Thyroid: No thyromegaly.     Vascular: No carotid bruit.  Cardiovascular:     Rate and Rhythm: Normal rate and regular rhythm.     Heart sounds: Normal heart sounds.  Pulmonary:     Effort: Pulmonary effort is normal.     Breath sounds: Normal breath sounds. No wheezing.  Chest:  Breasts:    Right: No mass.     Left: No mass.  Abdominal:     General: Bowel sounds are normal.     Palpations: Abdomen is soft.     Tenderness: There is no abdominal tenderness.  Musculoskeletal:     Cervical back: Normal range of motion and neck supple.     Lumbar back: Spasms and tenderness present. Decreased range of motion. Positive right straight leg raise test and positive left straight leg raise test.     Right lower leg: No edema.     Left lower leg: No  edema.  Lymphadenopathy:     Cervical: No cervical adenopathy.  Skin:  General: Skin is warm and dry.  Neurological:     Mental Status: He is alert and oriented to person, place, and time.     Motor: Weakness present.     Gait: Gait abnormal.     Deep Tendon Reflexes: Reflexes are normal and symmetric.  Psychiatric:        Attention and Perception: Attention normal.        Mood and Affect: Mood normal.        Thought Content: Thought content normal.    Diabetic Foot Exam - Simple   Simple Foot Form Diabetic Foot exam was performed with the following findings: Yes 05/20/2023  8:48 AM  Visual Inspection No deformities, no ulcerations, no other skin breakdown bilaterally: Yes Sensation Testing Intact to touch and monofilament testing bilaterally: Yes Pulse Check Posterior Tibialis and Dorsalis pulse intact bilaterally: Yes Comments      Wt Readings from Last 3 Encounters:  05/20/23 230 lb (104.3 kg)  03/11/23 229 lb (103.9 kg)  10/30/22 234 lb 12.8 oz (106.5 kg)    BP 124/76   Pulse 76   Ht 5\' 10"  (1.778 m)   Wt 230 lb (104.3 kg)   SpO2 98%   BMI 33.00 kg/m   Assessment and Plan:  Problem List Items Addressed This Visit       Unprioritized   BPH without obstruction/lower urinary tract symptoms (Chronic)    On Tamsulosin with good response and no side effects.      Relevant Orders   PSA   Chronic bilateral low back pain    Recent flare up after moving his mattress He is using ice and topical rubs and gradually improving He declined any other medications than oxycodone      Essential hypertension - Primary (Chronic)    BP controlled on lisinopril and amlodipine.      Relevant Orders   CBC with Differential/Platelet   Comprehensive metabolic panel   TSH   GERD (gastroesophageal reflux disease) (Chronic)    Reflux symptoms are minimal on current therapy - pantoprazole. No red flag signs such as weight loss, n/v, melena       Relevant Orders   CBC  with Differential/Platelet   Hyperlipidemia due to type 2 diabetes mellitus (HCC) (Chronic)    On appropriate statin therapy. Lab Results  Component Value Date   LDLCALC 64 05/10/2022         Relevant Medications   metFORMIN (GLUCOPHAGE) 1000 MG tablet   Other Relevant Orders   Lipid panel   Primary insomnia    Chronic longstanding issues Unresponsive to otc medications He declines Rx medications due to cost and low likelihood of benefit      Type II diabetes mellitus with complication (HCC) (Chronic)    Blood sugars stable without hypoglycemic symptoms or events. Current regimen is glimepiride and metformin Changes made last visit are stopping metformin per pt preference and starting jardiance.  However, he can not afford Jardiance. Currently working with CCM to get patient assistance so has continued metformin. Lab Results  Component Value Date   HGBA1C 8.5 (H) 03/11/2023         Relevant Medications   metFORMIN (GLUCOPHAGE) 1000 MG tablet   Other Relevant Orders   Comprehensive metabolic panel   Hemoglobin A1c   Microalbumin / creatinine urine ratio   Other Visit Diagnoses     Long term current use of oral hypoglycemic drug           Return in  about 4 months (around 09/20/2023) for DM, HTN.    Reubin Milan, MD [facility] [place]

## 2023-05-20 NOTE — Assessment & Plan Note (Signed)
BP controlled on lisinopril and amlodipine.

## 2023-05-20 NOTE — Assessment & Plan Note (Signed)
On appropriate statin therapy. Lab Results  Component Value Date   LDLCALC 64 05/10/2022

## 2023-05-20 NOTE — Assessment & Plan Note (Addendum)
Blood sugars stable without hypoglycemic symptoms or events. Current regimen is glimepiride and metformin Changes made last visit are stopping metformin per pt preference and starting jardiance.  However, he can not afford Jardiance. Currently working with CCM to get patient assistance so has continued metformin. Lab Results  Component Value Date   HGBA1C 8.5 (H) 03/11/2023

## 2023-05-20 NOTE — Assessment & Plan Note (Signed)
Reflux symptoms are minimal on current therapy - pantoprazole. No red flag signs such as weight loss, n/v, melena  

## 2023-05-20 NOTE — Assessment & Plan Note (Signed)
On Tamsulosin with good response and no side effects.

## 2023-05-20 NOTE — Assessment & Plan Note (Signed)
Chronic longstanding issues Unresponsive to otc medications He declines Rx medications due to cost and low likelihood of benefit

## 2023-05-21 LAB — COMPREHENSIVE METABOLIC PANEL
ALT: 14 [IU]/L (ref 0–44)
AST: 11 [IU]/L (ref 0–40)
Albumin: 4.2 g/dL (ref 3.8–4.8)
Alkaline Phosphatase: 79 [IU]/L (ref 44–121)
BUN/Creatinine Ratio: 11 (ref 10–24)
BUN: 13 mg/dL (ref 8–27)
Bilirubin Total: 0.7 mg/dL (ref 0.0–1.2)
CO2: 23 mmol/L (ref 20–29)
Calcium: 9.6 mg/dL (ref 8.6–10.2)
Chloride: 102 mmol/L (ref 96–106)
Creatinine, Ser: 1.15 mg/dL (ref 0.76–1.27)
Globulin, Total: 2.9 g/dL (ref 1.5–4.5)
Glucose: 235 mg/dL — ABNORMAL HIGH (ref 70–99)
Potassium: 4.5 mmol/L (ref 3.5–5.2)
Sodium: 141 mmol/L (ref 134–144)
Total Protein: 7.1 g/dL (ref 6.0–8.5)
eGFR: 67 mL/min/{1.73_m2} (ref >59)

## 2023-05-21 LAB — LIPID PANEL
Chol/HDL Ratio: 3.6 {ratio} (ref 0.0–5.0)
Cholesterol, Total: 131 mg/dL (ref 100–199)
HDL: 36 mg/dL — ABNORMAL LOW (ref >39)
LDL Chol Calc (NIH): 79 mg/dL (ref 0–99)
Triglycerides: 81 mg/dL (ref 0–149)
VLDL Cholesterol Cal: 16 mg/dL (ref 5–40)

## 2023-05-21 LAB — CBC WITH DIFFERENTIAL/PLATELET
Basophils Absolute: 0.1 10*3/uL (ref 0.0–0.2)
Basos: 1 %
EOS (ABSOLUTE): 0.2 10*3/uL (ref 0.0–0.4)
Eos: 3 %
Hematocrit: 46.1 % (ref 37.5–51.0)
Hemoglobin: 14.7 g/dL (ref 13.0–17.7)
Immature Grans (Abs): 0 10*3/uL (ref 0.0–0.1)
Immature Granulocytes: 0 %
Lymphocytes Absolute: 2.7 10*3/uL (ref 0.7–3.1)
Lymphs: 31 %
MCH: 28.7 pg (ref 26.6–33.0)
MCHC: 31.9 g/dL (ref 31.5–35.7)
MCV: 90 fL (ref 79–97)
Monocytes Absolute: 0.7 10*3/uL (ref 0.1–0.9)
Monocytes: 8 %
Neutrophils Absolute: 5 10*3/uL (ref 1.4–7.0)
Neutrophils: 57 %
Platelets: 254 10*3/uL (ref 150–450)
RBC: 5.13 x10E6/uL (ref 4.14–5.80)
RDW: 12.6 % (ref 11.6–15.4)
WBC: 8.8 10*3/uL (ref 3.4–10.8)

## 2023-05-21 LAB — MICROALBUMIN / CREATININE URINE RATIO
Creatinine, Urine: 179.3 mg/dL
Microalb/Creat Ratio: 220 mg/g{creat} — ABNORMAL HIGH (ref 0–29)
Microalbumin, Urine: 395 ug/mL

## 2023-05-21 LAB — HEMOGLOBIN A1C
Est. average glucose Bld gHb Est-mCnc: 214 mg/dL
Hgb A1c MFr Bld: 9.1 % — ABNORMAL HIGH (ref 4.8–5.6)

## 2023-05-21 LAB — PSA: Prostate Specific Ag, Serum: 0.7 ng/mL (ref 0.0–4.0)

## 2023-05-21 LAB — TSH: TSH: 4.38 u[IU]/mL (ref 0.450–4.500)

## 2023-05-22 ENCOUNTER — Other Ambulatory Visit: Payer: Self-pay | Admitting: Pharmacist

## 2023-05-22 NOTE — Progress Notes (Signed)
   05/22/2023  Patient ID: Bruce Richardson, male   DOB: 1951-02-02, 72 y.o.   MRN: [id number]  Receive a message from patient's PCP requesting outreach to patient/wife. Note patient seen in office and provider advises wife requested further assistance with online application.  Follow up with patient/spouse by telephone again today.  Spouse reports that she was previously unsuccessful with submitting Extra Help application for patient, but states now successfully submitted application online via Social Security website this morning.   Plan:  1) Patient/spouse to watch for response from Social Security via mail and to retain this letter, whether patient approved or denied. If receives response prior to our next telephone appointment, patient/spouse to contact clinical pharmacist sooner  2) If patient denied: based on reported income, he would qualify for patient assistance for Jardiance from [facility] and will collaborate with [facility] CPhT and prescriber to aid patient with completing this application   Follow Up Plan: Clinical Pharmacist will follow up with patient/wife by telephone on 07/02/2023 at 10:00 AM    Estelle Grumbles, PharmD, [facility] [phone]

## 2023-05-22 NOTE — Patient Instructions (Signed)
Goals Addressed             This Visit's Progress    Pharmacy Goals       Our goal A1c is less than 7%. This corresponds with fasting sugars less than 130 and 2 hour after meal sugars less than 180. Please keep a log of your results when checking your blood sugar   Our goal bad cholesterol, or LDL, is less than 70. This is why it is important to continue taking your rosuvastatin.  Please follow up with Social Security at [phone] to determine if patient's application for Extra Help subsidy was approved or denied.  If your application for Extra Help is denied, we can assist you with applying to receive your Jardiance directly from the manufacturer at no cost.  Estelle Grumbles, PharmD, [facility] [phone]

## 2023-05-22 NOTE — Progress Notes (Signed)
Pt stated that they wont let his wife get in. Pt states he can't get the medication.  KP

## 2023-05-30 ENCOUNTER — Other Ambulatory Visit: Payer: Medicare HMO | Admitting: Pharmacist

## 2023-07-02 ENCOUNTER — Telehealth: Payer: Self-pay | Admitting: Pharmacist

## 2023-07-02 ENCOUNTER — Other Ambulatory Visit (HOSPITAL_COMMUNITY): Payer: Self-pay

## 2023-07-02 ENCOUNTER — Other Ambulatory Visit: Payer: Medicare HMO | Admitting: Pharmacist

## 2023-07-02 NOTE — Progress Notes (Unsigned)
Dr. Judithann Graves,   Today confirmed that patient now approved for Extra Help subsidy, which brings the cost of his Jardiance down to $11.20 for 90 day supply. Reports his home morning blood sugar running ~180-190 on glipizide ER 2.5 mg daily and metformin 1000 mg twice daily.   Patient is going to call mail order to request Jardiance Rx. With start of Jardiance, would you consider having patient stop glipizide to reduce future risk of low blood sugar?  Thank you!  Estelle Grumbles, PharmD, [facility] [phone]

## 2023-07-03 NOTE — Progress Notes (Signed)
07/03/2023 Name: Bruce Richardson MRN: [id number] DOB: 1950-09-26  Chief Complaint  Patient presents with   Medication Assistance    Bruce Richardson is a 72 y.o. year old male who presented for a telephone visit.   They were referred to the pharmacist by their PCP for assistance in managing medication access.   Speak with both patient and wife.   Subjective:   Care Team: Primary Care Provider: Reubin Milan, MD  Medication Access/Adherence  Current Pharmacy:  [facility] - [place], [place] - [address] Portal to [facility] [address] [place] [address] Phone: [phone] Fax: [phone]   Patient reports affordability concerns with their medications: Yes  Patient reports access/transportation concerns to their pharmacy: No  Patient reports adherence concerns with their medications:  No     Uses weekly pillbox to organize his medications   Note plan per 03/11/2023 Office Visit note from PCP was to Stop metformin and Start Jardiance 25 mg daily. Patient was provided with Jardiance sample. However, patient reports that he did not make this change (did not start Jardiance sample) because cost for medication through his insurance was unaffordable - During latest conversation with patient/spouse on 10/3, spouse advised that she successfully submitted application for Extra Help via Social Security website that day  Today patient/spouse advise that they were told by their insurance broker that patient now has Extra Help, but are unsure of what level (partial vs full) patient was approved for - Reports received a letter from [employer], but unable to locate this today   Diabetes:   Current medications:  - metformin 1000 mg twice daily with meals - glipizide ER 2.5 mg daily with breakfast    Recalls recent morning blood sugar reading ranging 180-190s   Patient denies hypoglycemic s/sx including dizziness,  shakiness, sweating.    Statin therapy: rosuvastatin 40 mg daily    Objective:  Lab Results  Component Value Date   HGBA1C 9.1 (H) 05/20/2023    Lab Results  Component Value Date   CREATININE 1.15 05/20/2023   BUN 13 05/20/2023   NA 141 05/20/2023   K 4.5 05/20/2023   CL 102 05/20/2023   CO2 23 05/20/2023    Lab Results  Component Value Date   CHOL 131 05/20/2023   HDL 36 (L) 05/20/2023   LDLCALC 79 05/20/2023   TRIG 81 05/20/2023   CHOLHDL 3.6 05/20/2023    Medications Reviewed Today     Reviewed by Manuela Neptune, RPH-CPP (Pharmacist) on 07/03/23 at [phone]  Med List Status: <None>   Medication Order Taking? Sig Documenting Provider Last Dose Status Informant  albuterol (VENTOLIN HFA) 108 (90 Base) MCG/ACT inhaler [id number]  USE 2 INHALATIONS BY MOUTH  EVERY 6 HOURS AS NEEDED FOR WHEEZING OR SHORTNESS OF  BREATH Reubin Milan, MD  Active   amLODipine (NORVASC) 5 MG tablet [id number]  Take 1 tablet (5 mg total) by mouth daily. Reubin Milan, MD  Active   aspirin EC 81 MG tablet [id number]  Take 1 tablet (81 mg total) by mouth daily. Smitty Cords, DO  Active Self  empagliflozin (JARDIANCE) 25 MG TABS tablet [id number] No Take 25 mg by mouth daily. Before breakfast  Patient not taking: Reported on 07/03/2023   [provider] Not Taking Active   fluticasone (FLONASE) 50 MCG/ACT nasal spray [id number]  USE 1 SPRAY IN BOTH  NOSTRILS DAILY Reubin Milan, MD  Active  glipiZIDE (GLUCOTROL XL) 2.5 MG 24 hr tablet [id number] Yes Take 1 tablet (2.5 mg total) by mouth daily with breakfast. Reubin Milan, MD Taking Active   glucose blood test strip [id number]  Check blood sugar twice daily. Dx.E11.9 Reubin Milan, MD  Active   lisinopril (ZESTRIL) 40 MG tablet [id number]  Take 1 tablet (40 mg total) by mouth daily. Reubin Milan, MD  Active   metFORMIN (GLUCOPHAGE) 1000 MG tablet [id number] Yes Take 1 tablet (1,000 mg total) by mouth 2 (two)  times daily with a meal. Reubin Milan, MD Taking Active   [facility] LANCETS 33G [place] [id number]  1 each by Does not apply route 2 (two) times daily. Dx: E11.9 Smitty Cords, DO  Active Self  pantoprazole (PROTONIX) 40 MG tablet [id number]  Take 1 tablet (40 mg total) by mouth daily. Reubin Milan, MD  Active   rosuvastatin (CRESTOR) 40 MG tablet [id number]  Take 1 tablet (40 mg total) by mouth daily. Reubin Milan, MD  Active   tamsulosin [facility]) 0.4 MG CAPS capsule [id number]  Take 1 capsule (0.4 mg total) by mouth daily after supper. Reubin Milan, MD  Active               Assessment/Plan:   Collaborate with [facility] CPhT Ayla to run test claim through patients insurance to determine cost of Jardiance for patient with now having Extra Help subsidy - Find patient's cost for [place] is now $11.20 for 90 day supply - Follow up with patient to provide this update - [facility]ardiance prescription (previously sent to pharmacy by PCP in July)  Counseled on SGLT2, including mechanism of action, side effects, and benefits. Discussed potential side effects of dehydration, genitourinary infections. Encouraged adequate hydration and genital hygiene. Advised on sick day rules (if a day with significantly reduced oral intake, serious vomiting, or diarrhea, hold SGLT2). Patient verbalized understanding.    Diabetes: - Currently uncontrolled - Reviewed long term cardiovascular and renal outcomes of uncontrolled blood sugar - Reviewed goal A1c, goal fasting, and goal 2 hour post prandial glucose - Reviewed dietary modifications including importance of having regular well-balanced meals while controlling carbohydrate portion sizes - Collaborate with PCP regarding plan to start Jardiance now that medication is affordable to patient Provider advises that patient is to start Jardiance in addition to current medication  (metformin and glipizide ER) - Recommend to check glucose, keep log of results and have this record to review at upcoming medical appointments. Patient to contact provider office sooner if needed for readings outside of established parameters or symptoms, particularly if has any symptoms of low blood sugar   Follow Up Plan:   Patient denies further medication questions or concerns today Provide patient with contact information for clinic pharmacist to contact if needed in future for medication questions/concerns    Estelle Grumbles, PharmD, [facility] [phone]

## 2023-07-03 NOTE — Patient Instructions (Signed)
Goals Addressed             This Visit's Progress    Pharmacy Goals       Take Jardiance in the morning, as it can cause more frequent urination with more sugar in the urine. It may worsen risk for dehydration or genital infections. Focus on staying well hydrated and using good genital hygiene. Stop the medication and call our office if you develop any symptoms of genital infections, such as burning, itching, or pain while urinating or itching with redness that could be a yeast infection. If you have a day that you are vomiting or having diarrhea and you are very dehydrated, please hold this medication until you feel better.    Our goal A1c is less than 7%. This corresponds with fasting sugars less than 130 and 2 hour after meal sugars less than 180. Please keep a log of your results when checking your blood sugar   Our goal bad cholesterol, or LDL, is less than 70. This is why it is important to continue taking your rosuvastatin.  Please follow up with Social Security at [phone] to determine if patient's application for Extra Help subsidy was approved or denied.  If your application for Extra Help is denied, we can assist you with applying to receive your Jardiance directly from the manufacturer at no cost.   Estelle Grumbles, PharmD, [facility] [phone]

## 2023-07-15 NOTE — Telephone Encounter (Signed)
Pt returned call back to speak with Wayland Salinas, I let him know she is out of office today. He wouldn't say what the call is about

## 2023-07-15 NOTE — Telephone Encounter (Signed)
Please review.  KP

## 2023-07-16 ENCOUNTER — Other Ambulatory Visit: Payer: Self-pay | Admitting: Pharmacist

## 2023-07-16 NOTE — Progress Notes (Signed)
   07/16/2023  Patient ID: Bruce Richardson, male   DOB: Sep 02, 1950, 72 y.o.   MRN: [id number]   Receive a message from PCP office from 11/26 requesting call back to patient.  Reached patient and spouse by telephone today. State that per [facility] they are not able to fill Jardiance prescription that PCP sent in July; that mail order pharmacy is requesting a new prescription.  Outreach to [facility] today on behalf of patient. Speak with Janelle Floor and representative is able to locate PCP's order from 03/11/2023 and states pharmacy will process this for patient today. Confirms patient's copayment will be $11.20 for 90 day supply and that she will contact patient today for his authorization before completing processing/shipping the medication to him. - Follow up with patient/spouse to provide this update   Patient denies further medication questions or concerns today Provide patient with contact information for clinic pharmacist to contact if needed in future for medication questions/concerns     Estelle Grumbles, PharmD, [facility] [phone]

## 2023-07-16 NOTE — Patient Instructions (Signed)
Goals Addressed             This Visit's Progress    Pharmacy Goals       Take Jardiance in the morning, as it can cause more frequent urination with more sugar in the urine. It may worsen risk for dehydration or genital infections. Focus on staying well hydrated and using good genital hygiene. Stop the medication and call our office if you develop any symptoms of genital infections, such as burning, itching, or pain while urinating or itching with redness that could be a yeast infection. If you have a day that you are vomiting or having diarrhea and you are very dehydrated, please hold this medication until you feel better.   Our goal A1c is less than 7%. This corresponds with fasting sugars less than 130 and 2 hour after meal sugars less than 180. Please keep a log of your results when checking your blood sugar   Our goal bad cholesterol, or LDL, is less than 70. This is why it is important to continue taking your rosuvastatin.   Estelle Grumbles, PharmD, [facility] [phone]

## 2023-07-16 NOTE — Progress Notes (Signed)
Contacted patient today and addressed medication access need. Please see encounter from 07/16/2023 for further details

## 2023-10-21 ENCOUNTER — Telehealth: Payer: Self-pay

## 2023-10-21 NOTE — Telephone Encounter (Signed)
 Patient was identified as falling into the [place] Measure - Diabetes.   Patient was: Appointment scheduled with primary care provider in the next 30 days.

## 2023-10-24 ENCOUNTER — Encounter: Payer: Self-pay | Admitting: Internal Medicine

## 2023-10-24 ENCOUNTER — Ambulatory Visit: Admitting: Internal Medicine

## 2023-10-24 VITALS — BP 122/72 | HR 99 | Ht 70.0 in | Wt 230.1 lb

## 2023-10-24 DIAGNOSIS — E785 Hyperlipidemia, unspecified: Secondary | ICD-10-CM | POA: Diagnosis not present

## 2023-10-24 DIAGNOSIS — K219 Gastro-esophageal reflux disease without esophagitis: Secondary | ICD-10-CM | POA: Diagnosis not present

## 2023-10-24 DIAGNOSIS — Z7984 Long term (current) use of oral hypoglycemic drugs: Secondary | ICD-10-CM | POA: Diagnosis not present

## 2023-10-24 DIAGNOSIS — I10 Essential (primary) hypertension: Secondary | ICD-10-CM | POA: Diagnosis not present

## 2023-10-24 DIAGNOSIS — J432 Centrilobular emphysema: Secondary | ICD-10-CM | POA: Diagnosis not present

## 2023-10-24 DIAGNOSIS — E118 Type 2 diabetes mellitus with unspecified complications: Secondary | ICD-10-CM

## 2023-10-24 DIAGNOSIS — J3089 Other allergic rhinitis: Secondary | ICD-10-CM | POA: Diagnosis not present

## 2023-10-24 DIAGNOSIS — I5032 Chronic diastolic (congestive) heart failure: Secondary | ICD-10-CM

## 2023-10-24 DIAGNOSIS — E1169 Type 2 diabetes mellitus with other specified complication: Secondary | ICD-10-CM

## 2023-10-24 DIAGNOSIS — B356 Tinea cruris: Secondary | ICD-10-CM

## 2023-10-24 DIAGNOSIS — N400 Enlarged prostate without lower urinary tract symptoms: Secondary | ICD-10-CM

## 2023-10-24 LAB — POCT GLYCOSYLATED HEMOGLOBIN (HGB A1C): Hemoglobin A1C: 7.8 % — AB (ref 4.0–5.6)

## 2023-10-24 MED ORDER — AMLODIPINE BESYLATE 5 MG PO TABS
5.0000 mg | ORAL_TABLET | Freq: Every day | ORAL | 3 refills | Status: AC
Start: 2023-10-24 — End: ?

## 2023-10-24 MED ORDER — EMPAGLIFLOZIN 25 MG PO TABS: 25.0000 mg | ORAL_TABLET | Freq: Every day | ORAL | 1 refills | Status: AC

## 2023-10-24 MED ORDER — FLUTICASONE PROPIONATE 50 MCG/ACT NA SUSP: 2.0000 | Freq: Every day | NASAL | 1 refills | Status: DC

## 2023-10-24 MED ORDER — ROSUVASTATIN CALCIUM 40 MG PO TABS: 40.0000 mg | ORAL_TABLET | Freq: Every day | ORAL | 3 refills | Status: AC

## 2023-10-24 MED ORDER — METFORMIN HCL 1000 MG PO TABS
1000.0000 mg | ORAL_TABLET | Freq: Two times a day (BID) | ORAL | 1 refills | Status: DC
Start: 2023-10-24 — End: 2024-02-24

## 2023-10-24 MED ORDER — GLIPIZIDE ER 2.5 MG PO TB24
2.5000 mg | ORAL_TABLET | Freq: Every day | ORAL | 3 refills | Status: AC
Start: 2023-10-24 — End: ?

## 2023-10-24 MED ORDER — PANTOPRAZOLE SODIUM 40 MG PO TBEC: 40.0000 mg | DELAYED_RELEASE_TABLET | Freq: Every day | ORAL | 3 refills | Status: AC

## 2023-10-24 MED ORDER — TAMSULOSIN HCL 0.4 MG PO CAPS
0.4000 mg | ORAL_CAPSULE | Freq: Every day | ORAL | 3 refills | Status: AC
Start: 2023-10-24 — End: ?

## 2023-10-24 MED ORDER — LISINOPRIL 40 MG PO TABS: 40.0000 mg | ORAL_TABLET | Freq: Every day | ORAL | 3 refills | Status: AC

## 2023-10-24 MED ORDER — CLOTRIMAZOLE-BETAMETHASONE 1-0.05 % EX CREA: 1.0000 | TOPICAL_CREAM | Freq: Two times a day (BID) | CUTANEOUS | 1 refills | Status: AC

## 2023-10-24 NOTE — Assessment & Plan Note (Addendum)
 BS uncontrolled on MTF and glucotrol. He has been finally able to get Jardiance but does not like the frequent urination A1C today = 7.8.  I have encouraged him to continue all his medications.

## 2023-10-24 NOTE — Progress Notes (Signed)
 Date:  10/24/2023   Name:  Bruce Richardson   DOB:  Mar 16, 1951   MRN:  [id number]   Chief Complaint: Diabetes and Hypertension  Diabetes He presents for his follow-up diabetic visit. He has type 2 diabetes mellitus. His disease course has been fluctuating. Hypoglycemia symptoms include nervousness/anxiousness. Pertinent negatives for hypoglycemia include no headaches or tremors. Pertinent negatives for diabetes include no chest pain, no fatigue, no polydipsia and no polyuria.  Hypertension This is a chronic problem. The problem is controlled. Pertinent negatives include no chest pain, headaches, palpitations or shortness of breath. Past treatments include ACE inhibitors and calcium channel blockers.    Review of Systems  Constitutional:  Negative for appetite change, fatigue and unexpected weight change.  Eyes:  Negative for visual disturbance.  Respiratory:  Negative for cough, shortness of breath and wheezing.   Cardiovascular:  Negative for chest pain, palpitations and leg swelling.  Gastrointestinal:  Negative for abdominal pain and blood in stool.  Endocrine: Negative for polydipsia and polyuria.  Genitourinary:  Positive for frequency. Negative for dysuria and hematuria.  Skin:  Positive for rash (in groin). Negative for color change.  Neurological:  Negative for tremors, numbness and headaches.  Psychiatric/Behavioral:  Positive for dysphoric mood. Negative for sleep disturbance. The patient is nervous/anxious.      Lab Results  Component Value Date   NA 141 05/20/2023   K 4.5 05/20/2023   CO2 23 05/20/2023   GLUCOSE 235 (H) 05/20/2023   BUN 13 05/20/2023   CREATININE 1.15 05/20/2023   CALCIUM 9.6 05/20/2023   EGFR 67 05/20/2023   GFRNONAA 62 07/12/2020   Lab Results  Component Value Date   CHOL 131 05/20/2023   HDL 36 (L) 05/20/2023   LDLCALC 79 05/20/2023   TRIG 81 05/20/2023   CHOLHDL 3.6 05/20/2023   Lab Results  Component Value Date   TSH 4.380 05/20/2023    Lab Results  Component Value Date   HGBA1C 7.8 (A) 10/24/2023   Lab Results  Component Value Date   WBC 8.8 05/20/2023   HGB 14.7 05/20/2023   HCT 46.1 05/20/2023   MCV 90 05/20/2023   PLT 254 05/20/2023   Lab Results  Component Value Date   ALT 14 05/20/2023   AST 11 05/20/2023   ALKPHOS 79 05/20/2023   BILITOT 0.7 05/20/2023   No results found for: "25OHVITD2", "25OHVITD3", "VD25OH"   Patient Active Problem List   Diagnosis Date Noted   Primary insomnia 05/20/2023   Eczema 11/14/2021   BPPV (benign paroxysmal positional vertigo), unspecified laterality 12/24/2020   Chronic diastolic congestive heart failure (HCC) 11/29/2019   Centrilobular emphysema (HCC) 01/26/2019   Aortic atherosclerosis (HCC) 01/26/2019   Echocardiogram shows left ventricular diastolic dysfunction 02/16/2018   Erectile dysfunction 02/16/2018   History of CVA (cerebrovascular accident) without residual deficits 08/13/2017   BPH without obstruction/lower urinary tract symptoms 03/19/2017   Chronic bilateral low back pain 11/14/2016   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   GERD (gastroesophageal reflux disease) 04/11/2016   Obesity 04/11/2016   Allergic rhinitis 08/04/2015   Essential hypertension 07/04/2015   Hyperlipidemia due to type 2 diabetes mellitus (HCC) 07/04/2015   Type II diabetes mellitus with complication (HCC) 07/04/2015    No Known Allergies  Past Surgical History:  Procedure Laterality Date   CIRCUMCISION N/A 07/24/2015   Procedure: CIRCUMCISION ADULT;  Surgeon: Hildred Laser, MD;  Location: [facility] ORS;  Service: Urology;  Laterality: N/A;   EYE  SURGERY Right    right eye cataract surgery    SHOULDER ARTHROSCOPY Bilateral    SHOULDER ARTHROSCOPY Right 12/22/2014   Procedure: RIGHT SHOULDER ARTHROSCOPY /DECOMPRESSION/ROTATOR CUFF REPAIR OF RECURRENT ROTATOR CUFF TEAR;  Surgeon: Christena Flake, MD;  Location: [facility] ORS;  Service: Orthopedics;  Laterality:  Right;    Social History   Tobacco Use   Smoking status: Former    Current packs/day: 0.00    Average packs/day: 1.5 packs/day for 25.0 years (37.6 ttl pk-yrs)    Types: Cigarettes    Start date: 12/19/2001    Quit date: 07/03/2002    Years since quitting: 21.3   Smokeless tobacco: Former   Tobacco comments:    quit smoking in 06/2001  Vaping Use   Vaping status: Never Used  Substance Use Topics   Alcohol use: No    Alcohol/week: 0.0 standard drinks of alcohol   Drug use: No     Medication list has been reviewed and updated.  Current Meds  Medication Sig   albuterol (VENTOLIN HFA) 108 (90 Base) MCG/ACT inhaler USE 2 INHALATIONS BY MOUTH  EVERY 6 HOURS AS NEEDED FOR WHEEZING OR SHORTNESS OF  BREATH   aspirin EC 81 MG tablet Take 1 tablet (81 mg total) by mouth daily.   glucose blood test strip Check blood sugar twice daily. Dx.E11.9   ONETOUCH DELICA LANCETS 33G MISC 1 each by Does not apply route 2 (two) times daily. Dx: E11.9   [DISCONTINUED] amLODipine (NORVASC) 5 MG tablet Take 1 tablet (5 mg total) by mouth daily.   [DISCONTINUED] fluticasone (FLONASE) 50 MCG/ACT nasal spray USE 1 SPRAY IN BOTH  NOSTRILS DAILY   [DISCONTINUED] glipiZIDE (GLUCOTROL XL) 2.5 MG 24 hr tablet Take 1 tablet (2.5 mg total) by mouth daily with breakfast.   [DISCONTINUED] lisinopril (ZESTRIL) 40 MG tablet Take 1 tablet (40 mg total) by mouth daily.   [DISCONTINUED] metFORMIN (GLUCOPHAGE) 1000 MG tablet Take 1 tablet (1,000 mg total) by mouth 2 (two) times daily with a meal.   [DISCONTINUED] pantoprazole (PROTONIX) 40 MG tablet Take 1 tablet (40 mg total) by mouth daily.   [DISCONTINUED] rosuvastatin (CRESTOR) 40 MG tablet Take 1 tablet (40 mg total) by mouth daily.   [DISCONTINUED] tamsulosin (FLOMAX) 0.4 MG CAPS capsule Take 1 capsule (0.4 mg total) by mouth daily after supper.       10/24/2023    9:51 AM 03/11/2023   10:24 AM 05/10/2022    8:17 AM 11/14/2021    9:44 AM  GAD 7 : Generalized  Anxiety Score  Nervous, Anxious, on Edge 1 0 1 1  Control/stop worrying 0 0 0 0  Worry too much - different things 1 0 0 2  Trouble relaxing 3 0 1 2  Restless 2 0 1 1  Easily annoyed or irritable 2 0 0 2  Afraid - awful might happen 0 0 0 0  Total GAD 7 Score 9 0 3 8  Anxiety Difficulty Somewhat difficult Not difficult at all Not difficult at all        10/24/2023    9:50 AM 03/11/2023   10:23 AM 10/30/2022    9:05 AM  Depression screen PHQ 2/9  Decreased Interest 1 3 0  Down, Depressed, Hopeless 0 3 1  PHQ - 2 Score 1 6 1   Altered sleeping 3 3 1   Tired, decreased energy 2 3 1   Change in appetite 1 1 0  Feeling bad or failure about yourself  1 2 0  Trouble concentrating 1 0 0  Moving slowly or fidgety/restless 1 0 0  Suicidal thoughts 0 0 0  PHQ-9 Score 10 15 3   Difficult doing work/chores Somewhat difficult Somewhat difficult Not difficult at all    BP Readings from Last 3 Encounters:  10/24/23 122/72  05/20/23 124/76  03/11/23 134/82    Physical Exam Vitals and nursing note reviewed.  Constitutional:      General: He is not in acute distress.    Appearance: Normal appearance. He is well-developed.  HENT:     Head: Normocephalic and atraumatic.  Cardiovascular:     Rate and Rhythm: Normal rate and regular rhythm.     Heart sounds: No murmur heard. Pulmonary:     Effort: Pulmonary effort is normal. No respiratory distress.     Breath sounds: No wheezing or rhonchi.  Musculoskeletal:     Right lower leg: No edema.     Left lower leg: No edema.  Skin:    General: Skin is warm and dry.     Findings: No rash.  Neurological:     Mental Status: He is alert and oriented to person, place, and time.  Psychiatric:        Mood and Affect: Mood normal.        Behavior: Behavior normal.     Wt Readings from Last 3 Encounters:  10/24/23 230 lb 2 oz (104.4 kg)  05/20/23 230 lb (104.3 kg)  03/11/23 229 lb (103.9 kg)    BP 122/72   Pulse 99   Ht 5\' 10"  (1.778 m)    Wt 230 lb 2 oz (104.4 kg)   SpO2 95%   BMI 33.02 kg/m   Assessment and Plan:  Problem List Items Addressed This Visit       Unprioritized   Essential hypertension (Chronic)   Controlled BP with normal exam. Current regimen is lisinopril and amlodipine. Will continue same medications; encourage continued reduced sodium diet.       Relevant Medications   amLODipine (NORVASC) 5 MG tablet   lisinopril (ZESTRIL) 40 MG tablet   rosuvastatin (CRESTOR) 40 MG tablet   Hyperlipidemia due to type 2 diabetes mellitus (HCC) (Chronic)   LDL is  Lab Results  Component Value Date   LDLCALC 79 05/20/2023   Current regimen is Crestor.  Tolerating medications well without issues.       Relevant Medications   amLODipine (NORVASC) 5 MG tablet   glipiZIDE (GLUCOTROL XL) 2.5 MG 24 hr tablet   lisinopril (ZESTRIL) 40 MG tablet   metFORMIN (GLUCOPHAGE) 1000 MG tablet   rosuvastatin (CRESTOR) 40 MG tablet   empagliflozin (JARDIANCE) 25 MG TABS tablet   Type II diabetes mellitus with complication (HCC) - Primary (Chronic)   BS uncontrolled on MTF and glucotrol. He has been finally able to get Jardiance but does not like the frequent urination A1C today = 7.8.  I have encouraged him to continue all his medications.      Relevant Medications   glipiZIDE (GLUCOTROL XL) 2.5 MG 24 hr tablet   lisinopril (ZESTRIL) 40 MG tablet   metFORMIN (GLUCOPHAGE) 1000 MG tablet   rosuvastatin (CRESTOR) 40 MG tablet   empagliflozin (JARDIANCE) 25 MG TABS tablet   Other Relevant Orders   POCT glycosylated hemoglobin (Hb A1C) (Completed)   GERD (gastroesophageal reflux disease) (Chronic)   Reflux symptoms are minimal on current therapy - pantoprazole. No red flag signs such as weight loss, n/v, melena       Relevant  Medications   pantoprazole (PROTONIX) 40 MG tablet   BPH without obstruction/lower urinary tract symptoms (Chronic)   Relevant Medications   tamsulosin (FLOMAX) 0.4 MG CAPS capsule    Centrilobular emphysema (HCC) (Chronic)   Relevant Medications   fluticasone (FLONASE) 50 MCG/ACT nasal spray   Chronic diastolic congestive heart failure (HCC) (Chronic)   Relevant Medications   amLODipine (NORVASC) 5 MG tablet   lisinopril (ZESTRIL) 40 MG tablet   rosuvastatin (CRESTOR) 40 MG tablet   Allergic rhinitis   Relevant Medications   fluticasone (FLONASE) 50 MCG/ACT nasal spray   Other Visit Diagnoses       Long term current use of oral hypoglycemic drug         Tinea cruris       Relevant Medications   clotrimazole-betamethasone (LOTRISONE) cream       Return in about 4 months (around 02/23/2024) for DM, HTN.    Reubin Milan, MD [facility] [place]

## 2023-10-24 NOTE — Assessment & Plan Note (Signed)
 LDL is  Lab Results  Component Value Date   LDLCALC 79 05/20/2023   Current regimen is Crestor.  Tolerating medications well without issues.

## 2023-10-24 NOTE — Assessment & Plan Note (Signed)
 Reflux symptoms are minimal on current therapy - pantoprazole. No red flag signs such as weight loss, n/v, melena

## 2023-10-24 NOTE — Patient Instructions (Signed)
 Team Member Role and Specialty Contact Info Address Start End Comments  Isla Pence, [place] ([employer]) Phone: [phone] Fax: [phone] [address] [place] [address] 01/26/2019 - -

## 2023-10-24 NOTE — Assessment & Plan Note (Signed)
 Controlled BP with normal exam. Current regimen is lisinopril and amlodipine. Will continue same medications; encourage continued reduced sodium diet.

## 2023-11-05 ENCOUNTER — Ambulatory Visit: Payer: Medicare Other

## 2023-11-05 DIAGNOSIS — Z0000 Encounter for general adult medical examination without abnormal findings: Secondary | ICD-10-CM

## 2023-11-05 NOTE — Patient Instructions (Addendum)
 Mr. Bruce Richardson , Thank you for taking time to come for your Medicare Wellness Visit. I appreciate your ongoing commitment to your health goals. Please review the following plan we discussed and let me know if I can assist you in the future.   Referrals/Orders/Follow-Ups/Clinician Recommendations: NONE  This is a list of the screening recommended for you and due dates:  Health Maintenance  Topic Date Due   Eye exam for diabetics  06/06/2023   COVID-19 Vaccine (4 - 2024-25 season) 11/09/2023*   Flu Shot  11/17/2023*   Pneumonia Vaccine (1 of 2 - PCV) 05/12/2024*   Zoster (Shingles) Vaccine (1 of 2) 04/05/2026*   Hemoglobin A1C  04/25/2024   Yearly kidney function blood test for diabetes  05/19/2024   Yearly kidney health urinalysis for diabetes  05/19/2024   Complete foot exam   05/19/2024   Medicare Annual Wellness Visit  11/04/2024   Cologuard (Stool DNA test)  01/21/2026   Hepatitis C Screening  Completed   HPV Vaccine  Aged Out   DTaP/Tdap/Td vaccine  Discontinued  *Topic was postponed. The date shown is not the original due date.    Advanced directives: (ACP Link)Information on Advanced Care Planning can be found at [facility] Advance Health Care Directives Advance Health Care Directives. [email or web address]   Next Medicare Annual Wellness Visit scheduled for next year: Yes  11/10/24 @ 8:10 AM BY PHONE

## 2023-11-05 NOTE — Progress Notes (Signed)
 Subjective:   Bruce Richardson is a 73 y.o. who presents for a Medicare Wellness preventive visit.  Visit Complete: Virtual I connected with  Audelia Acton on 11/05/23 by a audio enabled telemedicine application and verified that I am speaking with the correct person using two identifiers.  Patient Location: Home  Provider Location: Office/Clinic  I discussed the limitations of evaluation and management by telemedicine. The patient expressed understanding and agreed to proceed.  Vital Signs: Because this visit was a virtual/telehealth visit, some criteria may be missing or patient reported. Any vitals not documented were not able to be obtained and vitals that have been documented are patient reported.  VideoDeclined- This patient declined Librarian, academic. Therefore the visit was completed with audio only.  Persons Participating in Visit: Patient.  AWV Questionnaire: No: Patient Medicare AWV questionnaire was not completed prior to this visit.  Cardiac Risk Factors include: advanced age (>2men, >40 women);diabetes mellitus;dyslipidemia;obesity (BMI >30kg/m2);sedentary lifestyle;male gender;hypertension     Objective:    There were no vitals filed for this visit. There is no height or weight on file to calculate BMI.     11/05/2023    8:23 AM 10/30/2022    9:08 AM 10/22/2021    8:18 AM 10/18/2020    8:31 AM 06/26/2020    1:53 PM 10/13/2019   10:27 AM 03/17/2019   12:20 PM  Advanced Directives  Does Patient Have a Medical Advance Directive? No No Yes No No No No  Type of Surveyor, minerals;Living will      Copy of Healthcare Power of Attorney in Chart?   No - copy requested      Would patient like information on creating a medical advance directive? No - Patient declined No - Patient declined  No - Patient declined  Yes (MAU/Ambulatory/Procedural Areas - Information given) No - Patient declined    Current Medications  (verified) Outpatient Encounter Medications as of 11/05/2023  Medication Sig   albuterol (VENTOLIN HFA) 108 (90 Base) MCG/ACT inhaler USE 2 INHALATIONS BY MOUTH  EVERY 6 HOURS AS NEEDED FOR WHEEZING OR SHORTNESS OF  BREATH   amLODipine (NORVASC) 5 MG tablet Take 1 tablet (5 mg total) by mouth daily.   aspirin EC 81 MG tablet Take 1 tablet (81 mg total) by mouth daily.   clotrimazole-betamethasone (LOTRISONE) cream Apply 1 Application topically 2 (two) times daily. Apply twice a day to groin rash   empagliflozin (JARDIANCE) 25 MG TABS tablet Take 1 tablet (25 mg total) by mouth daily. Before breakfast   fluticasone (FLONASE) 50 MCG/ACT nasal spray Place 2 sprays into both nostrils daily.   glipiZIDE (GLUCOTROL XL) 2.5 MG 24 hr tablet Take 1 tablet (2.5 mg total) by mouth daily with breakfast.   glucose blood test strip Check blood sugar twice daily. Dx.E11.9   lisinopril (ZESTRIL) 40 MG tablet Take 1 tablet (40 mg total) by mouth daily.   metFORMIN (GLUCOPHAGE) 1000 MG tablet Take 1 tablet (1,000 mg total) by mouth 2 (two) times daily with a meal.   ONETOUCH DELICA LANCETS 33G MISC 1 each by Does not apply route 2 (two) times daily. Dx: E11.9   pantoprazole (PROTONIX) 40 MG tablet Take 1 tablet (40 mg total) by mouth daily.   rosuvastatin (CRESTOR) 40 MG tablet Take 1 tablet (40 mg total) by mouth daily.   tamsulosin (FLOMAX) 0.4 MG CAPS capsule Take 1 capsule (0.4 mg total) by mouth daily after supper.  No facility-administered encounter medications on file as of 11/05/2023.    Allergies (verified) Patient has no known allergies.   History: Past Medical History:  Diagnosis Date   Arthritis    Bulging lumbar disc    lower back   Complete rotator cuff rupture of left shoulder 09/23/2014   Complication of anesthesia    kept moving while under anesthesia during colonoscopy, hard to put under.   Diabetes mellitus without complication (HCC)    GERD (gastroesophageal reflux disease)     History of repair of rotator cuff 08/31/2014   Hyperlipidemia    Hypertension    Injury of tendon of upper extremity 12/23/2014   Moderate episode of recurrent major depressive disorder (HCC) 02/16/2018   Non-retracting foreskin 07/04/2015   Stroke (HCC)    Tendinitis of elbow or forearm 04/26/2015   Past Surgical History:  Procedure Laterality Date   CIRCUMCISION N/A 07/24/2015   Procedure: CIRCUMCISION ADULT;  Surgeon: Hildred Laser, MD;  Location: [facility] ORS;  Service: Urology;  Laterality: N/A;   EYE SURGERY Right    right eye cataract surgery    SHOULDER ARTHROSCOPY Bilateral    SHOULDER ARTHROSCOPY Right 12/22/2014   Procedure: RIGHT SHOULDER ARTHROSCOPY /DECOMPRESSION/ROTATOR CUFF REPAIR OF RECURRENT ROTATOR CUFF TEAR;  Surgeon: Christena Flake, MD;  Location: [facility] ORS;  Service: Orthopedics;  Laterality: Right;   Family History  Problem Relation Age of Onset   Alzheimer's disease Mother    Diabetes Mother    Hypertension Mother    Cancer Father    Heart failure Brother    Diabetes Brother    Hypertension Brother    Heart disease Brother    Prostate cancer Other        unknown   Bladder Cancer Other        unknown   Heart disease Son    Diabetes Sister    Social History   Socioeconomic History   Marital status: Married    Spouse name: Not on file   Number of children: Not on file   Years of education: Not on file   Highest education level: 10th grade  Occupational History   Occupation: retired  Tobacco Use   Smoking status: Former    Current packs/day: 0.00    Average packs/day: 1.5 packs/day for 25.0 years (37.6 ttl pk-yrs)    Types: Cigarettes    Start date: 12/19/2001    Quit date: 07/03/2002    Years since quitting: 21.3   Smokeless tobacco: Former   Tobacco comments:    quit smoking in 06/2001  Vaping Use   Vaping status: Never Used  Substance and Sexual Activity   Alcohol use: No    Alcohol/week: 0.0 standard drinks of alcohol   Drug use: No   Sexual  activity: Yes    Partners: Female  Other Topics Concern   Not on file  Social History Narrative   Not on file   Social Drivers of Health   Financial Resource Strain: Medium Risk (11/05/2023)   Overall Financial Resource Strain (CARDIA)    Difficulty of Paying Living Expenses: Somewhat hard  Food Insecurity: No Food Insecurity (11/05/2023)   Hunger Vital Sign    Worried About Running Out of Food in the Last Year: Never true    Ran Out of Food in the Last Year: Never true  Recent Concern: Food Insecurity - Food Insecurity Present (10/23/2023)   Hunger Vital Sign    Worried About Running Out of Food in the Last  Year: Sometimes true    Ran Out of Food in the Last Year: Never true  Transportation Needs: No Transportation Needs (11/05/2023)   PRAPARE - Administrator, Civil Service (Medical): No    Lack of Transportation (Non-Medical): No  Physical Activity: Insufficiently Active (11/05/2023)   Exercise Vital Sign    Days of Exercise per Week: 2 days    Minutes of Exercise per Session: 20 min  Stress: Stress Concern Present (11/05/2023)   [employer] of Occupational Health - Occupational Stress Questionnaire    Feeling of Stress : To some extent  Social Connections: Moderately Isolated (11/05/2023)   Social Connection and Isolation Panel [NHANES]    Frequency of Communication with Friends and Family: More than three times a week    Frequency of Social Gatherings with Friends and Family: Never    Attends Religious Services: Never    Database administrator or Organizations: No    Attends Banker Meetings: Never    Marital Status: Married    Tobacco Counseling Counseling given: Not Answered Tobacco comments: quit smoking in 06/2001    Clinical Intake:  Pre-visit preparation completed: Yes  Pain : No/denies pain     BMI - recorded: 20 Nutritional Status: BMI of 19-24  Normal Nutritional Risks: None Diabetes: Yes CBG done?: No Did pt. bring in  CBG monitor from home?: No  Lab Results  Component Value Date   HGBA1C 7.8 (A) 10/24/2023   HGBA1C 9.1 (H) 05/20/2023   HGBA1C 8.5 (H) 03/11/2023     How often do you need to have someone help you when you read instructions, pamphlets, or other written materials from your doctor or pharmacy?: 1 - Never  Interpreter Needed?: No  Information entered by :: Kennedy Bucker, LPN   Activities of Daily Living     11/05/2023    8:24 AM  In your present state of health, do you have any difficulty performing the following activities:  Hearing? 1  Vision? 0  Difficulty concentrating or making decisions? 0  Walking or climbing stairs? 0  Dressing or bathing? 0  Doing errands, shopping? 0  Preparing Food and eating ? N  Using the Toilet? N  In the past six months, have you accidently leaked urine? N  Do you have problems with loss of bowel control? N  Managing your Medications? N  Managing your Finances? N  Housekeeping or managing your Housekeeping? N    Patient Care Team: Reubin Milan, MD as PCP - General (Internal Medicine) Isla Pence, OD (Optometry) Patsey Berthold, NP as Nurse Practitioner (Neurosurgery) Lonell Face, MD as Consulting Physician (Neurology) Ronney Asters, Jackelyn Poling, RPH-CPP as Pharmacist  Indicate any recent Medical Services you may have received from other than [facility] providers in the past year (date may be approximate).     Assessment:   This is a routine wellness examination for [place].  Hearing/Vision screen Hearing Screening - Comments:: NO AIDS Vision Screening - Comments:: WEARS GLASSES - DR. Clydene Pugh   Goals Addressed             This Visit's Progress    DIET - REDUCE SUGAR INTAKE         Depression Screen     11/05/2023    8:19 AM 10/24/2023    9:50 AM 03/11/2023   10:23 AM 10/30/2022    9:05 AM 05/10/2022    8:17 AM 11/14/2021    9:42 AM 10/22/2021    8:13 AM  PHQ 2/9 Scores  PHQ - 2 Score 5 1 6 1  0 3 6  PHQ- 9 Score 6 10 15 3 6 12  8     Fall Risk     11/05/2023    8:23 AM 10/24/2023    9:50 AM 03/11/2023   10:23 AM 10/30/2022    9:09 AM 05/10/2022    8:18 AM  Fall Risk   Falls in the past year? 1 1 0 0 0  Number falls in past yr: 1 1 0 0 0  Injury with Fall? 0 1 0 0 0  Risk for fall due to : History of fall(s) History of fall(s) No Fall Risks No Fall Risks No Fall Risks  Follow up Falls prevention discussed;Falls evaluation completed Falls evaluation completed Falls evaluation completed Falls prevention discussed;Falls evaluation completed Falls evaluation completed    MEDICARE RISK AT HOME:  Medicare Risk at Home Any stairs in or around the home?: Yes If so, are there any without handrails?: No Home free of loose throw rugs in walkways, pet beds, electrical cords, etc?: Yes Adequate lighting in your home to reduce risk of falls?: Yes Life alert?: No Use of a cane, walker or w/c?: No Grab bars in the bathroom?: Yes Shower chair or bench in shower?: Yes Elevated toilet seat or a handicapped toilet?: Yes  TIMED UP AND GO:  Was the test performed?  No  Cognitive Function: 6CIT completed    04/12/2015   10:08 AM  MMSE - Mini Mental State Exam  Orientation to time 5  Orientation to Place 5  Registration 3  Attention/ Calculation 5  Recall 3  Language- name 2 objects 2  Language- repeat 1  Language- follow 3 step command 3  Language- read & follow direction 0  Write a sentence 0  Copy design 0  Total score 27        11/05/2023    8:26 AM 10/30/2022    9:15 AM 10/13/2019   10:29 AM 02/10/2018    9:03 AM 02/04/2017    9:00 AM  6CIT Screen  What Year? 0 points 0 points 0 points 0 points 0 points  What month? 0 points 0 points 0 points 0 points 0 points  What time? 0 points 0 points 0 points 0 points 0 points  Count back from 20 0 points 0 points 0 points 0 points 0 points  Months in reverse 0 points 4 points 0 points 0 points 0 points  Repeat phrase 0 points 0 points 0 points 0 points 0 points   Total Score 0 points 4 points 0 points 0 points 0 points    Immunizations Immunization History  Administered Date(s) Administered   Moderna Sars-Covid-2 Vaccination 11/15/2019, 04/25/2020, 05/23/2020    Screening Tests Health Maintenance  Topic Date Due   OPHTHALMOLOGY EXAM  06/06/2023   COVID-19 Vaccine (4 - 2024-25 season) 11/09/2023 (Originally 04/20/2023)   INFLUENZA VACCINE  11/17/2023 (Originally 03/20/2023)   Pneumonia Vaccine 85+ Years old (1 of 2 - PCV) 05/12/2024 (Originally 02/16/1957)   Zoster Vaccines- Shingrix (1 of 2) 04/05/2026 (Originally 02/15/1969)   HEMOGLOBIN A1C  04/25/2024   Diabetic kidney evaluation - eGFR measurement  05/19/2024   Diabetic kidney evaluation - Urine ACR  05/19/2024   FOOT EXAM  05/19/2024   Medicare Annual Wellness (AWV)  11/04/2024   Fecal DNA (Cologuard)  01/21/2026   Hepatitis C Screening  Completed   HPV VACCINES  Aged Out   DTaP/Tdap/Td  Discontinued  Health Maintenance  Health Maintenance Due  Topic Date Due   OPHTHALMOLOGY EXAM  06/06/2023   Health Maintenance Items Addressed: DECLINES ALL SHOTS DECLINES REFERRAL FOR COLONOSCOPY NEEDS EYE EXAM  Additional Screening:  Vision Screening: Recommended annual ophthalmology exams for early detection of glaucoma and other disorders of the eye.  Dental Screening: Recommended annual dental exams for proper oral hygiene  Community Resource Referral / Chronic Care Management: CRR required this visit?  No   CCM required this visit?  No     Plan:     I have personally reviewed and noted the following in the patient's chart:   Medical and social history Use of alcohol, tobacco or illicit drugs  Current medications and supplements including opioid prescriptions. Patient is not currently taking opioid prescriptions. Functional ability and status Nutritional status Physical activity Advanced directives List of other physicians Hospitalizations, surgeries, and ER visits in  previous 12 months Vitals Screenings to include cognitive, depression, and falls Referrals and appointments  In addition, I have reviewed and discussed with patient certain preventive protocols, quality metrics, and best practice recommendations. A written personalized care plan for preventive services as well as general preventive health recommendations were provided to patient.     Hal Hope, LPN   [id number]/[id number]   After Visit Summary: (MyChart) Due to this being a telephonic visit, the after visit summary with patients personalized plan was offered to patient via MyChart   Notes: Nothing significant to report at this time.

## 2023-11-30 ENCOUNTER — Encounter: Payer: Self-pay | Admitting: Emergency Medicine

## 2023-11-30 ENCOUNTER — Observation Stay
Admit: 2023-11-30 | Discharge: 2023-11-30 | Disposition: A | Discharge status: Home / Self Care | Attending: Family Medicine | Admitting: Family Medicine

## 2023-11-30 ENCOUNTER — Emergency Department

## 2023-11-30 ENCOUNTER — Other Ambulatory Visit: Payer: Self-pay

## 2023-11-30 ENCOUNTER — Observation Stay

## 2023-11-30 ENCOUNTER — Observation Stay
Admission: EM | Admit: 2023-11-30 | Discharge: 2023-12-01 | Disposition: A | Discharge status: Home / Self Care | Attending: Internal Medicine | Admitting: Internal Medicine

## 2023-11-30 DIAGNOSIS — W1811XA Fall from or off toilet without subsequent striking against object, initial encounter: Secondary | ICD-10-CM | POA: Insufficient documentation

## 2023-11-30 DIAGNOSIS — I110 Hypertensive heart disease with heart failure: Secondary | ICD-10-CM | POA: Diagnosis not present

## 2023-11-30 DIAGNOSIS — R531 Weakness: Secondary | ICD-10-CM | POA: Diagnosis not present

## 2023-11-30 DIAGNOSIS — I5032 Chronic diastolic (congestive) heart failure: Secondary | ICD-10-CM | POA: Diagnosis not present

## 2023-11-30 DIAGNOSIS — S0990XA Unspecified injury of head, initial encounter: Secondary | ICD-10-CM | POA: Diagnosis not present

## 2023-11-30 DIAGNOSIS — R0989 Other specified symptoms and signs involving the circulatory and respiratory systems: Secondary | ICD-10-CM | POA: Diagnosis not present

## 2023-11-30 DIAGNOSIS — R9089 Other abnormal findings on diagnostic imaging of central nervous system: Secondary | ICD-10-CM | POA: Diagnosis not present

## 2023-11-30 DIAGNOSIS — Z8673 Personal history of transient ischemic attack (TIA), and cerebral infarction without residual deficits: Secondary | ICD-10-CM

## 2023-11-30 DIAGNOSIS — Y92012 Bathroom of single-family (private) house as the place of occurrence of the external cause: Secondary | ICD-10-CM | POA: Diagnosis not present

## 2023-11-30 DIAGNOSIS — Z794 Long term (current) use of insulin: Secondary | ICD-10-CM

## 2023-11-30 DIAGNOSIS — G9340 Encephalopathy, unspecified: Principal | ICD-10-CM

## 2023-11-30 DIAGNOSIS — E785 Hyperlipidemia, unspecified: Secondary | ICD-10-CM | POA: Diagnosis not present

## 2023-11-30 DIAGNOSIS — I10 Essential (primary) hypertension: Secondary | ICD-10-CM

## 2023-11-30 DIAGNOSIS — R350 Frequency of micturition: Secondary | ICD-10-CM | POA: Insufficient documentation

## 2023-11-30 DIAGNOSIS — Z7984 Long term (current) use of oral hypoglycemic drugs: Secondary | ICD-10-CM | POA: Diagnosis not present

## 2023-11-30 DIAGNOSIS — S199XXA Unspecified injury of neck, initial encounter: Secondary | ICD-10-CM | POA: Diagnosis not present

## 2023-11-30 DIAGNOSIS — Z7901 Long term (current) use of anticoagulants: Secondary | ICD-10-CM | POA: Insufficient documentation

## 2023-11-30 DIAGNOSIS — G9389 Other specified disorders of brain: Secondary | ICD-10-CM | POA: Diagnosis not present

## 2023-11-30 DIAGNOSIS — K219 Gastro-esophageal reflux disease without esophagitis: Secondary | ICD-10-CM | POA: Diagnosis present

## 2023-11-30 DIAGNOSIS — E1122 Type 2 diabetes mellitus with diabetic chronic kidney disease: Secondary | ICD-10-CM

## 2023-11-30 DIAGNOSIS — J432 Centrilobular emphysema: Secondary | ICD-10-CM | POA: Diagnosis not present

## 2023-11-30 DIAGNOSIS — R410 Disorientation, unspecified: Secondary | ICD-10-CM | POA: Diagnosis not present

## 2023-11-30 DIAGNOSIS — J449 Chronic obstructive pulmonary disease, unspecified: Secondary | ICD-10-CM

## 2023-11-30 DIAGNOSIS — R000 Tachycardia, unspecified: Secondary | ICD-10-CM | POA: Diagnosis not present

## 2023-11-30 DIAGNOSIS — I6789 Other cerebrovascular disease: Secondary | ICD-10-CM | POA: Insufficient documentation

## 2023-11-30 DIAGNOSIS — I700 Atherosclerosis of aorta: Secondary | ICD-10-CM | POA: Diagnosis not present

## 2023-11-30 DIAGNOSIS — M533 Sacrococcygeal disorders, not elsewhere classified: Secondary | ICD-10-CM | POA: Diagnosis not present

## 2023-11-30 DIAGNOSIS — E118 Type 2 diabetes mellitus with unspecified complications: Secondary | ICD-10-CM | POA: Diagnosis present

## 2023-11-30 DIAGNOSIS — E1169 Type 2 diabetes mellitus with other specified complication: Secondary | ICD-10-CM | POA: Diagnosis not present

## 2023-11-30 DIAGNOSIS — Z87891 Personal history of nicotine dependence: Secondary | ICD-10-CM | POA: Diagnosis not present

## 2023-11-30 DIAGNOSIS — Z79899 Other long term (current) drug therapy: Secondary | ICD-10-CM | POA: Insufficient documentation

## 2023-11-30 DIAGNOSIS — R4182 Altered mental status, unspecified: Secondary | ICD-10-CM | POA: Diagnosis present

## 2023-11-30 DIAGNOSIS — J8410 Pulmonary fibrosis, unspecified: Secondary | ICD-10-CM | POA: Diagnosis not present

## 2023-11-30 LAB — CBC WITH DIFFERENTIAL/PLATELET
Abs Immature Granulocytes: 0.02 10*3/uL (ref 0.00–0.07)
Basophils Absolute: 0.1 10*3/uL (ref 0.0–0.1)
Basophils Relative: 1 %
Eosinophils Absolute: 0.3 10*3/uL (ref 0.0–0.5)
Eosinophils Relative: 3 %
HCT: 48.3 % (ref 39.0–52.0)
Hemoglobin: 16.1 g/dL (ref 13.0–17.0)
Immature Granulocytes: 0 %
Lymphocytes Relative: 35 %
Lymphs Abs: 3.5 10*3/uL (ref 0.7–4.0)
MCH: 29.1 pg (ref 26.0–34.0)
MCHC: 33.3 g/dL (ref 30.0–36.0)
MCV: 87.2 fL (ref 80.0–100.0)
Monocytes Absolute: 1 10*3/uL (ref 0.1–1.0)
Monocytes Relative: 10 %
Neutro Abs: 5.2 10*3/uL (ref 1.7–7.7)
Neutrophils Relative %: 51 %
Platelets: 265 10*3/uL (ref 150–400)
RBC: 5.54 MIL/uL (ref 4.22–5.81)
RDW: 13.2 % (ref 11.5–15.5)
WBC: 10.2 10*3/uL (ref 4.0–10.5)
nRBC: 0 % (ref 0.0–0.2)

## 2023-11-30 LAB — URINALYSIS, ROUTINE W REFLEX MICROSCOPIC
Bacteria, UA: NONE SEEN
Bilirubin Urine: NEGATIVE
Glucose, UA: NEGATIVE mg/dL
Ketones, ur: NEGATIVE mg/dL
Leukocytes,Ua: NEGATIVE
Nitrite: NEGATIVE
Protein, ur: >=300 mg/dL — AB
Specific Gravity, Urine: 1.019 (ref 1.005–1.030)
pH: 5 (ref 5.0–8.0)

## 2023-11-30 LAB — BLOOD GAS, VENOUS
Acid-Base Excess: 3.3 mmol/L — ABNORMAL HIGH (ref 0.0–2.0)
Bicarbonate: 27.8 mmol/L (ref 20.0–28.0)
O2 Saturation: 77.3 %
Patient temperature: 37
pCO2, Ven: 41 mmHg — ABNORMAL LOW (ref 44–60)
pH, Ven: 7.44 — ABNORMAL HIGH (ref 7.25–7.43)
pO2, Ven: 47 mmHg — ABNORMAL HIGH (ref 32–45)

## 2023-11-30 LAB — COMPREHENSIVE METABOLIC PANEL WITH GFR
ALT: 12 U/L (ref 0–44)
AST: 16 U/L (ref 15–41)
Albumin: 4.1 g/dL (ref 3.5–5.0)
Alkaline Phosphatase: 62 U/L (ref 38–126)
Anion gap: 10 (ref 5–15)
BUN: 19 mg/dL (ref 8–23)
CO2: 23 mmol/L (ref 22–32)
Calcium: 9.9 mg/dL (ref 8.9–10.3)
Chloride: 105 mmol/L (ref 98–111)
Creatinine, Ser: 1.38 mg/dL — ABNORMAL HIGH (ref 0.61–1.24)
GFR, Estimated: 54 mL/min — ABNORMAL LOW (ref >60)
Glucose, Bld: 203 mg/dL — ABNORMAL HIGH (ref 70–99)
Potassium: 4.6 mmol/L (ref 3.5–5.1)
Sodium: 138 mmol/L (ref 135–145)
Total Bilirubin: 1.3 mg/dL — ABNORMAL HIGH (ref 0.0–1.2)
Total Protein: 8.1 g/dL (ref 6.5–8.1)

## 2023-11-30 LAB — URINE DRUG SCREEN, QUALITATIVE (ARMC ONLY)
Amphetamines, Ur Screen: NOT DETECTED
Barbiturates, Ur Screen: NOT DETECTED
Benzodiazepine, Ur Scrn: NOT DETECTED
Cannabinoid 50 Ng, Ur ~~LOC~~: NOT DETECTED
Cocaine Metabolite,Ur ~~LOC~~: NOT DETECTED
MDMA (Ecstasy)Ur Screen: NOT DETECTED
Methadone Scn, Ur: NOT DETECTED
Opiate, Ur Screen: NOT DETECTED
Phencyclidine (PCP) Ur S: NOT DETECTED
Tricyclic, Ur Screen: NOT DETECTED

## 2023-11-30 LAB — CBG MONITORING, ED
Glucose-Capillary: 154 mg/dL — ABNORMAL HIGH (ref 70–99)
Glucose-Capillary: 218 mg/dL — ABNORMAL HIGH (ref 70–99)

## 2023-11-30 LAB — TSH: TSH: 3.392 u[IU]/mL (ref 0.350–4.500)

## 2023-11-30 LAB — AMMONIA: Ammonia: 16 umol/L (ref 9–35)

## 2023-11-30 MED ORDER — SODIUM CHLORIDE 0.9 % IV SOLN
INTRAVENOUS | Status: AC
Start: 2023-11-30 — End: 2023-12-01

## 2023-11-30 MED ORDER — SODIUM CHLORIDE 0.9 % IV BOLUS
500.0000 mL | Freq: Once | INTRAVENOUS | Status: AC
  Administered 2023-11-30: 500 mL via INTRAVENOUS

## 2023-11-30 MED ORDER — ENOXAPARIN SODIUM 40 MG/0.4ML IJ SOSY
40.0000 mg | PREFILLED_SYRINGE | INTRAMUSCULAR | Status: DC
  Administered 2023-11-30 – 2023-12-01 (×2): 40 mg via SUBCUTANEOUS
  Filled 2023-11-30 (×2): qty 0.4

## 2023-11-30 MED ORDER — SODIUM CHLORIDE 0.9 % IV BOLUS: 1000.0000 mL | Freq: Once | INTRAVENOUS | Status: DC

## 2023-11-30 MED ORDER — LORAZEPAM 2 MG/ML IJ SOLN: 0.5000 mg | Freq: Once | INTRAMUSCULAR | Status: AC

## 2023-11-30 MED ORDER — ACETAMINOPHEN 160 MG/5ML PO SOLN: 650.0000 mg | ORAL | Status: DC | PRN

## 2023-11-30 MED ORDER — HALOPERIDOL LACTATE 5 MG/ML IJ SOLN
2.0000 mg | Freq: Once | INTRAMUSCULAR | Status: AC
  Administered 2023-11-30: 2 mg via INTRAVENOUS
  Filled 2023-11-30: qty 1

## 2023-11-30 MED ORDER — ACETAMINOPHEN 325 MG PO TABS: 650.0000 mg | ORAL_TABLET | ORAL | Status: DC | PRN

## 2023-11-30 MED ORDER — LABETALOL HCL 5 MG/ML IV SOLN: 10.0000 mg | INTRAVENOUS | Status: DC | PRN

## 2023-11-30 MED ORDER — INSULIN ASPART 100 UNIT/ML IJ SOLN
0.0000 [IU] | Freq: Three times a day (TID) | INTRAMUSCULAR | Status: DC
  Administered 2023-11-30: 2 [IU] via SUBCUTANEOUS
  Administered 2023-11-30 – 2023-12-01 (×2): 3 [IU] via SUBCUTANEOUS
  Filled 2023-11-30 (×3): qty 1

## 2023-11-30 MED ORDER — ACETAMINOPHEN 650 MG RE SUPP: 650.0000 mg | RECTAL | Status: DC | PRN

## 2023-11-30 MED ORDER — STROKE: EARLY STAGES OF RECOVERY BOOK: Freq: Once | Status: DC

## 2023-11-30 MED ORDER — LORAZEPAM 2 MG/ML IJ SOLN
INTRAMUSCULAR | Status: AC
  Administered 2023-11-30: 0.5 mg via INTRAVENOUS
  Filled 2023-11-30: qty 1

## 2023-11-30 MED ORDER — OLANZAPINE 10 MG IM SOLR: 2.5000 mg | Freq: Four times a day (QID) | INTRAMUSCULAR | Status: DC | PRN

## 2023-11-30 NOTE — ED Notes (Signed)
 Remains irritable and argumentative, but also joking and kidding. No changes.

## 2023-11-30 NOTE — ED Notes (Signed)
 Pt aox3 oriented to self , time, place, pt reports hx vertigo relieved with dramamine in the past pt denies CP denies SOB denies needs in general reports only worsening vertigo on occasion pt speaking in clear full sentences skin appears wdi non labored resps noted

## 2023-11-30 NOTE — ED Notes (Signed)
 Increasingly verbally agitated, impatient, confused about his phone and call bell. Verbally beligerent and blaming, then says thank you. Frequently on call bell, impatient with immediate response time, and believes we have turned his phone and the call bell off.

## 2023-11-30 NOTE — Progress Notes (Addendum)
 SLP Cancellation Note  Patient Details Name: Bruce Richardson MRN: [id number] DOB: 10-18-1950   Cancelled treatment:       Reason Eval/Treat Not Completed: Patient not medically ready (chart reviewed)  Chart reviewed.  Per NSG note this (mid)morning, "Remains irritable, agitated, and argumentative, with poor recent memory/recall. Speaking on phone with wife after RN dialed number. Endorses HA, from advil given to him (no advil given previously). C/o hunger, but dopes not want hospital food.".  Per Daughter's report in chart, "she noticed a significant change of his mental status("rapid decline in mentation over the past 3 to 4 days", per MD note). He had a Fall in the bathroom and c/o his bottom hurting. He was refusing going to the emergency department yesterday when EMS went out but was able to convince him to come today. States that he is normally alert and oriented and does all of his own needs. Would be able to use his phone and answer questions appropriately. Since yesterday noticed altered mental status, did not know how to operate things, was staring off into space. Intermittently started crying which is not his normal. Had a fall yesterday and felt like he was having some more weakness on the left side from his normal.".  CT Head at admit: Small chronic appearing infarcts in the left pons and inferior cerebellum, interval in the left cerebellum. Ischemic gliosis in the cerebral white matter which is generalized. Cerebral volume loss without specific pattern. No acute infarct.  MRI ordered.  Last MRI in 2018 revealed: "Moderately Advanced nonspecific chronic cerebral white matter signal changes in both hemispheres, most commonly due to chronic small vessel disease.".   D/t pt's current agitated presentation and pending MRI (results), will f/u w/ Cognitive-communication assessment tomorrow to determine if any needs; pt has only been admitted for ~5 hours at this time.  Recommend Nursing f/u w/  Swallow Screen per protocol to determine ability to initiate oral diet. Pt has stated wanting something to eat per chart note.       Darla Edward, MS, CCC-SLP Speech Language Pathologist Rehab Services; [facility] [phone] (ascom) Shinichi Anguiano 11/30/2023, 10:37 AM

## 2023-11-30 NOTE — Progress Notes (Signed)
  Echocardiogram 2D Echocardiogram has been performed.  Bruce Richardson 11/30/2023, 3:57 PM

## 2023-11-30 NOTE — ED Notes (Signed)
 Pt Aox2 currently non compliant with some care. Pt endorsees not wanting to be properly changed and toileted. Verbal re-direction implemented to no avail. Remains safely in bed at this time

## 2023-11-30 NOTE — ED Notes (Signed)
Echo into room, at BS 

## 2023-11-30 NOTE — ED Notes (Signed)
 Attempted call to spouse Thersia Flax: left message on mobile phone, no answer on home phone.

## 2023-11-30 NOTE — ED Notes (Signed)
 Remains irritable. Attempting to use cell phone, then hospital portable phone, unable. Assisted with phone call to his wife, speaking with wife.

## 2023-11-30 NOTE — ED Provider Notes (Signed)
 [facility] Provider Note    Event Date/Time   First MD Initiated Contact with Patient 11/30/23 [phone]     (approximate)   History   Altered Mental Status   HPI  Bruce Richardson is a 73 year old male with history of HTN, T2DM, CHF presenting to the emergency department for evaluation of altered mental status.  Patient reports that he fell in the bathroom, unable to tell me when.  Denies other complaints.  I did obtain collateral history from his wife over the phone.  She reports that the patient has had frequent urination over the past few days.  He fell in the bathroom yesterday.  Unknown head strike.  He was complaining of a headache following this.  Since that time, she has also noticed that the patient has had mild confusion such as difficulty operating the microwave. No known fevers.     Physical Exam   Triage Vital Signs: ED Triage Vitals  Encounter Vitals Group     BP 11/30/23 0530 (!) 157/103     Systolic BP Percentile --      Diastolic BP Percentile --      Pulse Rate 11/30/23 0530 (!) 123     Resp 11/30/23 0530 17     Temp --      Temp src --      SpO2 11/30/23 0530 98 %     Weight 11/30/23 0531 224 lb 6.9 oz (101.8 kg)     Height 11/30/23 0531 6\' 2"  (1.88 m)     Head Circumference --      Peak Flow --      Pain Score --      Pain Loc --      Pain Education --      Exclude from Growth Chart --     Most recent vital signs: Vitals:   11/30/23 0615 11/30/23 0630  BP: (!) 158/94 (!) 148/83  Pulse: (!) 110 91  Resp: (!) 23 15  SpO2: 96% 95%     General: Awake, interactive  CV:  Regular rate, good peripheral perfusion.  Resp:  Unlabored respirations, lungs good auscultation Abd:  Nondistended, soft, nontender to palpation Neuro:  Keenly aware, correctly answers month and age, able to blink eyes and squeeze hands, normal horizontal extraocular movements, no visual field loss, normal facial symmetry, no arm or leg motor drift, no limb  ataxia, normal sensation, no aphasia, no dysarthria, no inattention. NIH 0   ED Results / Procedures / Treatments   Labs (all labs ordered are listed, but only abnormal results are displayed) Labs Reviewed  COMPREHENSIVE METABOLIC PANEL WITH GFR - Abnormal; Notable for the following components:      Result Value   Glucose, Bld 203 (*)    Creatinine, Ser 1.38 (*)    Total Bilirubin 1.3 (*)    GFR, Estimated 54 (*)    All other components within normal limits  URINALYSIS, ROUTINE W REFLEX MICROSCOPIC - Abnormal; Notable for the following components:   Color, Urine YELLOW (*)    APPearance CLEAR (*)    Hgb urine dipstick MODERATE (*)    Protein, ur >=300 (*)    All other components within normal limits  CBC WITH DIFFERENTIAL/PLATELET     EKG EKG independently reviewed interpreted by myself (ER attending) demonstrates:  EKG incomplete due to due to technical difficulty, but visualized portion demonstrates likely sinus rhythm at a rate of 112, PR 127, QRS 128, QTc 508, no acute  ST changes over visualized portion  RADIOLOGY Imaging independently reviewed and interpreted by myself demonstrates:  CT head without acute bleed CT c-spine without acute fracture CXR without focal consolidation   Formal Radiology Read:  [facility] Chest Port 1 View Result Date: 11/30/2023 CLINICAL DATA:  Weakness EXAM: PORTABLE CHEST 1 VIEW COMPARISON:  06/26/2020 FINDINGS: Prominent heart size accentuated by technique. Stable upper mediastinal contours. Interstitial coarsening which is similar to prior. Fibrosis seen in the apical lungs on contemporaneous CT of the cervical spine. No acute opacity, effusion, or pneumothorax. IMPRESSION: Chronic interstitial lung disease with low volumes. No acute or focal finding. Electronically Signed   By: Ronnette Coke M.D.   On: 11/30/2023 06:25   CT Head Wo Contrast Result Date: 11/30/2023 CLINICAL DATA:  Head trauma, minor EXAM: CT HEAD WITHOUT CONTRAST CT CERVICAL SPINE  WITHOUT CONTRAST TECHNIQUE: Multidetector CT imaging of the head and cervical spine was performed following the standard protocol without intravenous contrast. Multiplanar CT image reconstructions of the cervical spine were also generated. RADIATION DOSE REDUCTION: This exam was performed according to the departmental dose-optimization program which includes automated exposure control, adjustment of the mA and/or kV according to patient size and/or use of iterative reconstruction technique. COMPARISON:  06/26/2020 FINDINGS: CT HEAD FINDINGS Brain: Small chronic appearing infarcts in the left pons and inferior cerebellum, interval in the left cerebellum. Ischemic gliosis in the cerebral white matter which is generalized. Cerebral volume loss without specific pattern. No acute infarct, hydrocephalus, or masslike finding Vascular: No hyperdense vessel or unexpected calcification. Skull: No acute fracture Sinuses/Orbits: No evidence of injury. CT CERVICAL SPINE FINDINGS Alignment: No traumatic malalignment Skull base and vertebrae: No acute fracture. No primary bone lesion or focal pathologic process. Soft tissues and spinal canal: No prevertebral fluid or swelling. No visible canal hematoma. Disc levels:  Generalized degenerative endplate and facet spurring. Upper chest: Fibrosis at the apices.  No acute finding IMPRESSION: No evidence of acute intracranial or cervical spine injury. Electronically Signed   By: Ronnette Coke M.D.   On: 11/30/2023 06:23   CT Cervical Spine Wo Contrast Result Date: 11/30/2023 CLINICAL DATA:  Head trauma, minor EXAM: CT HEAD WITHOUT CONTRAST CT CERVICAL SPINE WITHOUT CONTRAST TECHNIQUE: Multidetector CT imaging of the head and cervical spine was performed following the standard protocol without intravenous contrast. Multiplanar CT image reconstructions of the cervical spine were also generated. RADIATION DOSE REDUCTION: This exam was performed according to the departmental  dose-optimization program which includes automated exposure control, adjustment of the mA and/or kV according to patient size and/or use of iterative reconstruction technique. COMPARISON:  06/26/2020 FINDINGS: CT HEAD FINDINGS Brain: Small chronic appearing infarcts in the left pons and inferior cerebellum, interval in the left cerebellum. Ischemic gliosis in the cerebral white matter which is generalized. Cerebral volume loss without specific pattern. No acute infarct, hydrocephalus, or masslike finding Vascular: No hyperdense vessel or unexpected calcification. Skull: No acute fracture Sinuses/Orbits: No evidence of injury. CT CERVICAL SPINE FINDINGS Alignment: No traumatic malalignment Skull base and vertebrae: No acute fracture. No primary bone lesion or focal pathologic process. Soft tissues and spinal canal: No prevertebral fluid or swelling. No visible canal hematoma. Disc levels:  Generalized degenerative endplate and facet spurring. Upper chest: Fibrosis at the apices.  No acute finding IMPRESSION: No evidence of acute intracranial or cervical spine injury. Electronically Signed   By: Ronnette Coke M.D.   On: 11/30/2023 06:23    PROCEDURES:  Critical Care performed: No  Procedures  MEDICATIONS ORDERED IN ED: Medications  sodium chloride 0.9 % bolus 500 mL (500 mLs Intravenous New Bag/Given 11/30/23 0625)     IMPRESSION / MDM / ASSESSMENT AND PLAN / ED COURSE  I reviewed the triage vital signs and the nursing notes.  Differential diagnosis includes, but is not limited to, UTI or other infection, intracranial bleed, spine fracture, no evidence of thoracoabdominal trauma, anemia, electrolyte abnormality, dehydration  Patient's presentation is most consistent with acute presentation with potential threat to life or bodily function.  73 year old male presenting with altered mental status with recent frequent urination and fall.  Will obtain labs, CT to further evaluate.  Tachycardic on  presentation, will trial small fluid bolus.  7:04 AM CT imaging and x-Silvie Obremski reassuring.  Heart rate improved to the 80s after fluids.  Labs overall reassuring.  Urine overall not suggestive of infection.  Patient reassessed.  He is now complaining of pain over his tailbone from his fall.  He has been ambulatory.  Does have some tenderness to palpation on my exam.  No hip tenderness or instability.  Will obtain x-Patirica Longshore to further evaluate.  7:04 AM Signout to oncoming physician pending x-Alanta Scobey and reevaluation.  Patient not significantly altered on my exam here.  If x-Suzie Vandam is reassuring and he is able to ambulate with steady gait, suspect he may be stable for discharge home.      FINAL CLINICAL IMPRESSION(S) / ED DIAGNOSES   Final diagnoses:  Closed head injury, initial encounter  Sacral pain  Urinary frequency     Rx / DC Orders   ED Discharge Orders     None        Note:  This document was prepared using Dragon voice recognition software and may include unintentional dictation errors.   Claria Crofts, MD 11/30/23 [phone]

## 2023-11-30 NOTE — ED Notes (Addendum)
 Remains irritable and argumentative, with poor recent memory/ recall. Speaking on phone with wife after RN dialed number. Endorses HA, from advil given to him (no advil given previously). C/o hunger, but dopes not want hospital food.

## 2023-11-30 NOTE — Assessment & Plan Note (Signed)
 Blood sugar in the 200s SSI A1c Monitor

## 2023-11-30 NOTE — ED Notes (Signed)
 Ambulated to b/r with steady fluid gait. Normal gait and station. Denies dizziness or complaint with ambulation.

## 2023-11-30 NOTE — ED Notes (Addendum)
 EDP at [facility]. MRI calling family to screen pt.

## 2023-11-30 NOTE — ED Notes (Signed)
 EDP at [facility]

## 2023-11-30 NOTE — ED Notes (Signed)
 Pt with labile irritable and confusion. EDP speaking with daughter over phone after inability to get a hold of wife.

## 2023-11-30 NOTE — ED Provider Notes (Addendum)
 Care assumed of patient from outgoing provider.  See their note for initial history, exam and plan.  Clinical Course as of 11/30/23 0848  Sun Nov 30, 2023  0740 Presents to the emergency department with altered mental status and a fall.  Patient with mild confusion but otherwise is answering most questions appropriately.  Has a nonfocal neurologic exam with a low suspicion for CVA.  Initially tachycardic and received a 500 bolus with resolution of his tachycardia.  No findings of urinary tract infection.  No obvious infectious process.  CT scan of the head was negative.  Chest x-ray no signs of pneumonia.  Patient complaining of pain to his tailbone after fall.  Ambulatory without any difficulties.  Added on x-ray to further evaluate for possible fracture.  Likely will put the discharge if x-ray imaging reassuring and able to ambulate. [SM]  [phone] Multiple attempts to call the patient's wife.  Go straight to voicemail.  Attempted to call the patient's home also not answering. [SM]  [phone] Called and talked to the patient's daughter over the phone who lives near the patient.  States that she she noticed a significant change of his mental status starting yesterday.  He was refusing going to the emergency department yesterday when EMS went out but was able to convince him to come today.  States that he is normally alert and oriented and does all of his own needs.  Would be able to use his phone and answer questions appropriately.  Since yesterday noticed altered mental status, did not know how to operate things, was staring off into space.  Intermittently started crying which is not his normal.  Had a fall yesterday and felt like he was having some more weakness on the left side from his normal.  No history of seizures.  Patient able to ambulate in the emergency department.  Consulted hospitalist to admit for altered mental status workup. [SM]    Clinical Course User Index [SM] Viviano Ground, MD     Viviano Ground, MD 11/30/23 Drake Gens    Viviano Ground, MD 11/30/23 [phone]

## 2023-11-30 NOTE — ED Notes (Signed)
 Pt calling out on call bell to get up to void. Went in to help pt, pt accusing Manufacturing engineer of not coming quickly enough: "I saw you standing out there, I already pissed on myself!" Pt has called out several times already this morning and staff has gone into room to help him every time. Pt walked to bathroom, then came to door and opened it and said, "ya'll didn't have to cut my red light off. I saw what you done!" Primary RN at bedside helping pt.

## 2023-11-30 NOTE — ED Notes (Signed)
 Irritable, NAD, calm, interactive, mildly argumentative, endorses HA better/ resolved, denies current complaints. MAEx4, ambulatory at [facility] with steady gait. Speech clear. Returned to stretcher and monitor. VSS.

## 2023-11-30 NOTE — ED Notes (Signed)
 Advised nurse that patient has ready bed

## 2023-11-30 NOTE — ED Triage Notes (Signed)
 Pt arrives from home via ems spouse contacts ems c/c ams / diabetic complications pt ambulates to ed stretcher from ems stretcher in upright steady unassisted gait ems bg poc 242 pt aox3 Dr Synetta Eves @ bs

## 2023-11-30 NOTE — ED Notes (Signed)
 To MRI via stretcher. Bed rejected, will come back to ED.

## 2023-11-30 NOTE — Assessment & Plan Note (Signed)
 Allow for permissive hypertension pending CVA evaluation  Prn IV labetalol for SBP>220 or DBP>110

## 2023-11-30 NOTE — Assessment & Plan Note (Signed)
 Stable from a respiratory standpoint at present Continue home inhalers VBG x 1 in setting of baseline encephalopathy

## 2023-11-30 NOTE — Assessment & Plan Note (Signed)
 PPI ?

## 2023-11-30 NOTE — Assessment & Plan Note (Addendum)
 Positive generalized worsening confusion and agitation at home over the past 3 days No overt infection noted Daughter does report some?  Left-sided weakness and shaking over the course of the last 3 days Noted prior history of CVA in the past Fairly broad differential diagnosis for symptoms including dementia, CVA,?  Seizure event Noted baseline COPD Will check VBG to correlate Ammonia level x 1 UDS As needed Zyprexa for agitation in the interim Monitor

## 2023-11-30 NOTE — Assessment & Plan Note (Signed)
 2D echo December 2018 with a EF of 65 to 70% and grade 1 diastolic dysfunction Appears euvolemic Monitor

## 2023-11-30 NOTE — ED Notes (Signed)
 Pt verbaly re-directed and agreed to have soiled pants changed and skin assessed. Noted with moisture associated rash in bilateral groin area and around scrotum. Site cleaned and barrier applied. Bedding changed and pt educated on allowing RN to assist in self care if needed. Safety maintained

## 2023-11-30 NOTE — H&P (Signed)
 History and Physical    Patient: Bruce Richardson MVH:[id number] DOB: 05-06-1951 DOA: 11/30/2023 DOS: the patient was seen and examined on 11/30/2023 PCP: Sheron Dixons, MD  Patient coming from: Home  Chief Complaint:  Chief Complaint  Patient presents with   Altered Mental Status   HPI: Bruce Richardson is a 73 y.o. male with medical history significant of type 2 diabetes, hypertension, hyperlipidemia, prior CVA presenting with encephalopathy.  Limited history in the setting of encephalopathy and agitation present.  History primarily from patient's daughter Kennard Pea over the phone.  Per report, patient has had a rapid decline in mentation over the past 3 to 4 days.  Per the daughter, patient usually ambulatory able to help with things around the house including take care of his wife.  Patient with noted mild confusion as well as left-sided hand shaking and agitation since Friday.  No reported recent infections.  Positive generalized weakness.  No reports of slurred speech or confusion.  Patient's behavior has changed with patient reporting being scared of his daughter as well as being very irritable.  This is a rapid change from baseline per the daughter.  Denies any known prior history of dementia in the past.  Baseline diabetes.  Unclear if this is well-controlled.  No reported illicit drug use.  No reported tobacco or alcohol use. Presented to the ER afebrile, heart rate 90s to 100s, BP stable.  Satting well on room air.  White count 10.2, hemoglobin 16.1, platelets 265, creatinine 1.38, glucose 203.  Urinalysis not indicative of infection.  CT head, CT C-spine grossly stable apart from vascular white matter changes.  Chest x-ray and sacrum/coccyx plain films stable. Review of Systems: As mentioned in the history of present illness. All other systems reviewed and are negative. 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   Non-retracting foreskin 07/04/2015   Stroke (HCC)    Tendinitis of elbow or forearm 04/26/2015   Past Surgical History:  Procedure Laterality Date   CIRCUMCISION N/A 07/24/2015   Procedure: CIRCUMCISION ADULT;  Surgeon: Bart Born, MD;  Location: [facility] ORS;  Service: Urology;  Laterality: N/A;   EYE SURGERY Right    right eye cataract surgery    SHOULDER ARTHROSCOPY Bilateral    SHOULDER ARTHROSCOPY Right 12/22/2014   Procedure: RIGHT SHOULDER ARTHROSCOPY /DECOMPRESSION/ROTATOR CUFF REPAIR OF RECURRENT ROTATOR CUFF TEAR;  Surgeon: Elner Hahn, MD;  Location: [facility] ORS;  Service: Orthopedics;  Laterality: Right;   Social History:  reports that he quit smoking about 21 years ago. His smoking use included cigarettes. He started smoking about 21 years ago. He has a 37.6 pack-year smoking history. He has quit using smokeless tobacco. He reports that he does not drink alcohol and does not use drugs.  No Known Allergies  Family History  Problem Relation Age of Onset   Alzheimer's disease Mother    Diabetes Mother    Hypertension Mother  Cancer Father    Heart failure Brother    Diabetes Brother    Hypertension Brother    Heart disease Brother    Prostate cancer Other        unknown   Bladder Cancer Other        unknown   Heart disease Son    Diabetes Sister     Prior to Admission medications   Medication Sig Start Date End Date Taking? Authorizing Provider  albuterol (VENTOLIN HFA) 108 (90 Base) MCG/ACT inhaler USE 2 INHALATIONS BY MOUTH  EVERY 6  HOURS AS NEEDED FOR WHEEZING OR SHORTNESS OF  BREATH 04/19/22   Berglund, Laura H, MD  amLODipine (NORVASC) 5 MG tablet Take 1 tablet (5 mg total) by mouth daily. 10/24/23   Sheron Dixons, MD  aspirin EC 81 MG tablet Take 1 tablet (81 mg total) by mouth daily. 01/29/17   Karamalegos, Kayleen Party, DO  clotrimazole-betamethasone (LOTRISONE) cream Apply 1 Application topically 2 (two) times daily. Apply twice a day to groin rash 10/24/23   Sheron Dixons, MD  empagliflozin (JARDIANCE) 25 MG TABS tablet Take 1 tablet (25 mg total) by mouth daily. Before breakfast 10/24/23   Sheron Dixons, MD  fluticasone (FLONASE) 50 MCG/ACT nasal spray Place 2 sprays into both nostrils daily. 10/24/23   Sheron Dixons, MD  glipiZIDE (GLUCOTROL XL) 2.5 MG 24 hr tablet Take 1 tablet (2.5 mg total) by mouth daily with breakfast. 10/24/23   Sheron Dixons, MD  glucose blood test strip Check blood sugar twice daily. Dx.E11.9 03/11/23   Sheron Dixons, MD  lisinopril (ZESTRIL) 40 MG tablet Take 1 tablet (40 mg total) by mouth daily. 10/24/23   Sheron Dixons, MD  metFORMIN (GLUCOPHAGE) 1000 MG tablet Take 1 tablet (1,000 mg total) by mouth 2 (two) times daily with a meal. 10/24/23   Sheron Dixons, MD  [facility] DELICA LANCETS 33G MISC 1 each by Does not apply route 2 (two) times daily. Dx: E11.9 04/30/16   Karamalegos, Kayleen Party, DO  pantoprazole (PROTONIX) 40 MG tablet Take 1 tablet (40 mg total) by mouth daily. 10/24/23   Sheron Dixons, MD  rosuvastatin (CRESTOR) 40 MG tablet Take 1 tablet (40 mg total) by mouth daily. 10/24/23   Sheron Dixons, MD  tamsulosin (FLOMAX) 0.4 MG CAPS capsule Take 1 capsule (0.4 mg total) by mouth daily after supper. 10/24/23   Sheron Dixons, MD    Physical Exam: Vitals:   11/30/23 0900 11/30/23 0915 11/30/23 0930 11/30/23 1030  BP: (!) 185/97 (!) 189/86 (!) 189/99 (!) 151/83  Pulse: (!) 106 98 97 (!) 102  Resp: (!) 25 19 (!) 21 (!) 24  Temp:      TempSrc:      SpO2: 95%  94% 94% 97%  Weight:      Height:       Physical Exam Constitutional:      Appearance: He is obese.  HENT:     Head: Normocephalic and atraumatic.     Nose: Nose normal.     Mouth/Throat:     Mouth: Mucous membranes are moist.  Eyes:     Pupils: Pupils are equal, round, and reactive to light.  Cardiovascular:     Rate and Rhythm: Normal rate and regular rhythm.  Pulmonary:     Effort: Pulmonary effort is normal.  Abdominal:     General: Bowel sounds are normal.  Musculoskeletal:  General: Normal range of motion.  Neurological:     General: No focal deficit present.     Comments: Mild generalized agitation Oriented x 3  Psychiatric:        Mood and Affect: Mood normal.     Data Reviewed:  There are no new results to review at this time.  Assessment and Plan: * Encephalopathy Positive generalized worsening confusion and agitation at home over the past 3 days No overt infection noted Daughter does report some?  Left-sided weakness and shaking over the course of the last 3 days Noted prior history of CVA in the past Fairly broad differential diagnosis for symptoms including dementia, CVA,?  Seizure event Noted baseline COPD Will check VBG to correlate Ammonia level x 1 UDS As needed Zyprexa for agitation in the interim Monitor  Chronic diastolic congestive heart failure (HCC) 2D echo December 2018 with a EF of 65 to 70% and grade 1 diastolic dysfunction Appears euvolemic Monitor  Centrilobular emphysema (HCC) Stable from a respiratory standpoint at present Continue home inhalers VBG x 1 in setting of baseline encephalopathy  GERD (gastroesophageal reflux disease) PPI  Type II diabetes mellitus with complication (HCC) Blood sugar in the 200s SSI A1c Monitor  Essential hypertension Allow for permissive hypertension pending CVA evaluation  Prn IV labetalol for SBP>220 or DBP>110        Advance Care Planning:   Code Status: Full Code    Consults: None  Family Communication: Case discussed w/ daughter over the phone  Severity of Illness: The appropriate patient status for this patient is OBSERVATION. Observation status is judged to be reasonable and necessary in order to provide the required intensity of service to ensure the patient's safety. The patient's presenting symptoms, physical exam findings, and initial radiographic and laboratory data in the context of their medical condition is felt to place them at decreased risk for further clinical deterioration. Furthermore, it is anticipated that the patient will be medically stable for discharge from the hospital within 2 midnights of admission.   Author: Corrinne Din, MD 11/30/2023 10:47 AM  For on call review [email or web address].

## 2023-11-30 NOTE — ED Notes (Signed)
 Lying flat, supine, back from xray, pending results. Alert, NAD, calm, interactive.

## 2023-11-30 NOTE — ED Notes (Signed)
 Up to b/r, steady gait, normal gait and station.

## 2023-12-01 DIAGNOSIS — G9340 Encephalopathy, unspecified: Secondary | ICD-10-CM | POA: Diagnosis not present

## 2023-12-01 LAB — LIPID PANEL
Cholesterol: 104 mg/dL (ref 0–200)
HDL: 30 mg/dL — ABNORMAL LOW (ref >40)
LDL Cholesterol: 55 mg/dL (ref 0–99)
Total CHOL/HDL Ratio: 3.5 ratio
Triglycerides: 94 mg/dL (ref <150)
VLDL: 19 mg/dL (ref 0–40)

## 2023-12-01 LAB — CBG MONITORING, ED
Glucose-Capillary: 239 mg/dL — ABNORMAL HIGH (ref 70–99)
Glucose-Capillary: 190 mg/dL — ABNORMAL HIGH (ref 70–99)

## 2023-12-01 LAB — ECHOCARDIOGRAM COMPLETE
Area-P 1/2: 4.77 cm2
Height: 74 in
S' Lateral: 2.3 cm
Single Plane A4C EF: 66.5 %
Weight: 3590.85 [oz_av]

## 2023-12-01 MED ORDER — INSULIN GLARGINE-YFGN 100 UNIT/ML ~~LOC~~ SOLN
5.0000 [IU] | Freq: Every day | SUBCUTANEOUS | Status: DC
  Filled 2023-12-01: qty 0.05

## 2023-12-01 NOTE — Care Management Obs Status (Signed)
 MEDICARE OBSERVATION STATUS NOTIFICATION   Patient Details  Name: Bruce Richardson MRN: [id number] Date of Birth: [birth year]   Medicare Observation Status Notification Given:  Rudolph Cost, CMA 12/01/2023, 10:17 AM

## 2023-12-01 NOTE — Progress Notes (Signed)
 SLP Cancellation Note  Patient Details Name: Bruce Richardson MRN: [id number] DOB: 24-Jul-1951   Cancelled treatment:       Reason Eval/Treat Not Completed: SLP screened, no needs identified, will sign off (chart reviewed; consulted NSG then met w/ pt in room)   Pt admitted to the ED in setting of encephalopathy and agitation. Patient has had a decline in mentation in past several days. Per NSG reports, pt has required Much redirection w/ tasks and is non-compliant w/ care at times. Pt has been ambulatory w/ No difficulty swallowing. Speech is clear.  OF NOTE:  MRI-  "Brain w/ Moderately Severe, chronic small vessel ischemic disease. Chronic pontine and cerebellar infarcts.  No Acute infarct.".  Pt denied any difficulty swallowing and is currently on a regular diet; tolerates swallowing pills w/ water per NSG. Pt asking for something to drink which was provided -- no overt s/s of aspiration noted during intake of liquids.  Pt conversed in Basic conversation stating his wishes "to get out of here soon"; he chose something to drink from F:4 appropriately. Pt pt denied any speech-language deficits. Speech clear, intelligible.  No further skilled ST services indicated as pt appears at his communication baseline. Pt agreed. NSG to reconsult if any change in status while admitted.      Darla Edward, MS, CCC-SLP Speech Language Pathologist Rehab Services; [facility] [phone] (ascom) Stanford Strauch 12/01/2023, 11:06 AM

## 2023-12-01 NOTE — Discharge Summary (Signed)
 Physician Discharge Summary   Patient: Bruce Richardson MRN: [id number] DOB: 10/11/1950  Admit date:     11/30/2023  Discharge date: 12/01/23  Discharge Physician: Mickle Albe    PCP: Sheron Dixons, MD   Discharge Diagnoses: Principal Problem:   Encephalopathy Active Problems:   Essential hypertension   Type II diabetes mellitus with complication (HCC)   GERD (gastroesophageal reflux disease)   Centrilobular emphysema (HCC)   Chronic diastolic congestive heart failure (HCC)  Resolved Problems:   * No resolved hospital problems. [facility] Course: 73 yo M evaluated for AMS on 11/30/2023. Pt had an extensive workup completed in the ER. Brain MRI/MRA --> There is no evidence of an acute infarct, intracranial hemorrhage, mass, midline shift, or extra-axial fluid collection. MRA head was negative.  Lipid panel showed cholesterol 104 and LDL 55. Urine drug screen was also negative. TSH and ammonia level were wnl. UA was negative for infection. CXR did not show any pneumonia.   OT worked with the pt on the morning of 12/01/2023 and the OT summary is noted below --> Pt transitions easily without distress or concerns. Pt endorses living at home with wife and being Ind at baseline for self care, IADLs, and drives. Pt dons B shoes while seated on EOB without assistance and ambulates 500' without AD or assist. Pt able to bend down and pick up items from hallway floor with supervision for safety and no reports of dizziness. Pt returns to room at end of session. Pt does not appear to have acute OT needs at this time.   Spoke with the pt's daughter on 12/01/2023 and provided the update as noted above in the hospital course. She advised she will pick up the pt on 12/01/2023 and take him home and provide the care he needs.  Given the negative workup as noted above and re-assuring OT eval the pt will be discharged to the daughter's custody on 12/01/2023.   DISCHARGE MEDICATION: Allergies as of 12/01/2023    No Known Allergies      Medication List     TAKE these medications    albuterol 108 (90 Base) MCG/ACT inhaler Commonly known as: VENTOLIN HFA USE 2 INHALATIONS BY MOUTH  EVERY 6 HOURS AS NEEDED FOR WHEEZING OR SHORTNESS OF  BREATH   amLODipine 5 MG tablet Commonly known as: NORVASC Take 1 tablet (5 mg total) by mouth daily.   aspirin EC 81 MG tablet Take 1 tablet (81 mg total) by mouth daily.   clotrimazole-betamethasone cream Commonly known as: Lotrisone Apply 1 Application topically 2 (two) times daily. Apply twice a day to groin rash   empagliflozin 25 MG Tabs tablet Commonly known as: Jardiance Take 1 tablet (25 mg total) by mouth daily. Before breakfast   fluticasone 50 MCG/ACT nasal spray Commonly known as: FLONASE Place 2 sprays into both nostrils daily.   glipiZIDE 2.5 MG 24 hr tablet Commonly known as: GLUCOTROL XL Take 1 tablet (2.5 mg total) by mouth daily with breakfast.   glucose blood test strip Check blood sugar twice daily. Dx.E11.9   lisinopril 40 MG tablet Commonly known as: ZESTRIL Take 1 tablet (40 mg total) by mouth daily.   metFORMIN 1000 MG tablet Commonly known as: GLUCOPHAGE Take 1 tablet (1,000 mg total) by mouth 2 (two) times daily with a meal.   OneTouch Delica Lancets 33G Misc 1 each by Does not apply route 2 (two) times daily. Dx: E11.9   pantoprazole 40 MG tablet Commonly known  as: PROTONIX Take 1 tablet (40 mg total) by mouth daily.   rosuvastatin 40 MG tablet Commonly known as: CRESTOR Take 1 tablet (40 mg total) by mouth daily.   tamsulosin 0.4 MG Caps capsule Commonly known as: FLOMAX Take 1 capsule (0.4 mg total) by mouth daily after supper.        Discharge Exam: Filed Weights   11/30/23 0531  Weight: 101.8 kg   Physical Exam HENT:     Head: Normocephalic.     Mouth/Throat:     Mouth: Mucous membranes are moist.  Cardiovascular:     Rate and Rhythm: Normal rate.  Pulmonary:     Effort: Pulmonary  effort is normal.  Abdominal:     Palpations: Abdomen is soft.  Musculoskeletal:        General: Normal range of motion.     Cervical back: Neck supple.  Neurological:     Mental Status: He is alert. Mental status is at baseline.  Psychiatric:        Mood and Affect: Mood normal.      Condition at discharge: fair  The results of significant diagnostics from this hospitalization (including imaging, microbiology, ancillary and laboratory) are listed below for reference.   Imaging Studies: MR BRAIN WO CONTRAST Result Date: 11/30/2023 CLINICAL DATA:  Mental status change, unknown cause; Neuro deficit, acute, stroke suspected. Recent fall and headache. EXAM: MRI HEAD WITHOUT CONTRAST MRA HEAD WITHOUT CONTRAST TECHNIQUE: Multiplanar, multi-echo pulse sequences of the brain and surrounding structures were acquired without intravenous contrast. Angiographic images of the Circle of Willis were acquired using MRA technique without intravenous contrast. COMPARISON:  Head CT 11/30/2023. Head CTA 03/17/2019. Head MRI and MRA 08/13/2017. FINDINGS: MRI HEAD FINDINGS Brain: There is no evidence of an acute infarct, intracranial hemorrhage, mass, midline shift, or extra-axial fluid collection. As noted on today's CT, there are small chronic infarcts in the left pons and inferior left cerebellum with the latter being new from the prior MRI. Patchy to confluent T2 hyperintensities in the cerebral white matter bilaterally have progressed from the prior MRI and are nonspecific but compatible with moderately severe chronic small vessel ischemic disease. There is mild-to-moderate cerebral and cerebellar atrophy. Vascular: Major intracranial vascular flow voids are preserved. Skull and upper cervical spine: Unremarkable bone marrow signal. Sinuses/Orbits: Right cataract extraction. Mild mucosal thickening in the paranasal sinuses. Clear mastoid air cells. Other: None. MRA HEAD FINDINGS Anterior circulation: The petrous  portions of the internal carotid arteries were largely excluded. The included portions of both intracranial internal carotid arteries are widely patent. ACAs and MCAs are patent without evidence of a proximal branch occlusion or significant proximal stenosis. No aneurysm is identified. Posterior circulation: The distal vertebral arteries and proximal basilar artery were not imaged. The mid and distal portions of the basilar artery are widely patent. There is a large right posterior communicating artery. Both PCAs are patent without evidence of a significant proximal stenosis. No aneurysm is identified. Anatomic variants: None. IMPRESSION: 1. No acute intracranial abnormality. 2. Moderately severe chronic small vessel ischemic disease. Chronic pontine and cerebellar infarcts. 3. Negative head MRA. Electronically Signed   By: Sebastian Ache M.D.   On: 11/30/2023 15:12   MR ANGIO HEAD WO CONTRAST Result Date: 11/30/2023 CLINICAL DATA:  Mental status change, unknown cause; Neuro deficit, acute, stroke suspected. Recent fall and headache. EXAM: MRI HEAD WITHOUT CONTRAST MRA HEAD WITHOUT CONTRAST TECHNIQUE: Multiplanar, multi-echo pulse sequences of the brain and surrounding structures were acquired without intravenous contrast.  Angiographic images of the Circle of Willis were acquired using MRA technique without intravenous contrast. COMPARISON:  Head CT 11/30/2023. Head CTA 03/17/2019. Head MRI and MRA 08/13/2017. FINDINGS: MRI HEAD FINDINGS Brain: There is no evidence of an acute infarct, intracranial hemorrhage, mass, midline shift, or extra-axial fluid collection. As noted on today's CT, there are small chronic infarcts in the left pons and inferior left cerebellum with the latter being new from the prior MRI. Patchy to confluent T2 hyperintensities in the cerebral white matter bilaterally have progressed from the prior MRI and are nonspecific but compatible with moderately severe chronic small vessel ischemic  disease. There is mild-to-moderate cerebral and cerebellar atrophy. Vascular: Major intracranial vascular flow voids are preserved. Skull and upper cervical spine: Unremarkable bone marrow signal. Sinuses/Orbits: Right cataract extraction. Mild mucosal thickening in the paranasal sinuses. Clear mastoid air cells. Other: None. MRA HEAD FINDINGS Anterior circulation: The petrous portions of the internal carotid arteries were largely excluded. The included portions of both intracranial internal carotid arteries are widely patent. ACAs and MCAs are patent without evidence of a proximal branch occlusion or significant proximal stenosis. No aneurysm is identified. Posterior circulation: The distal vertebral arteries and proximal basilar artery were not imaged. The mid and distal portions of the basilar artery are widely patent. There is a large right posterior communicating artery. Both PCAs are patent without evidence of a significant proximal stenosis. No aneurysm is identified. Anatomic variants: None. IMPRESSION: 1. No acute intracranial abnormality. 2. Moderately severe chronic small vessel ischemic disease. Chronic pontine and cerebellar infarcts. 3. Negative head MRA. Electronically Signed   By: Sebastian Ache M.D.   On: 11/30/2023 15:12   DG Sacrum/Coccyx Result Date: 11/30/2023 CLINICAL DATA:  Fall on Friday with continued pain EXAM: SACRUM AND COCCYX - 3 VIEW COMPARISON:  None Available. FINDINGS: No evidence of fracture or diastasis involving the sacrum or other pelvic structures. Dystrophic type subcutaneous calcification in the gluteal region on the lateral view, incidental. Atheromatous calcification of the aorta and iliacs. IMPRESSION: No acute finding Electronically Signed   By: Tiburcio Pea M.D.   On: 11/30/2023 07:41   DG Chest Port 1 View Result Date: 11/30/2023 CLINICAL DATA:  Weakness EXAM: PORTABLE CHEST 1 VIEW COMPARISON:  06/26/2020 FINDINGS: Prominent heart size accentuated by technique.  Stable upper mediastinal contours. Interstitial coarsening which is similar to prior. Fibrosis seen in the apical lungs on contemporaneous CT of the cervical spine. No acute opacity, effusion, or pneumothorax. IMPRESSION: Chronic interstitial lung disease with low volumes. No acute or focal finding. Electronically Signed   By: Tiburcio Pea M.D.   On: 11/30/2023 06:25   CT Head Wo Contrast Result Date: 11/30/2023 CLINICAL DATA:  Head trauma, minor EXAM: CT HEAD WITHOUT CONTRAST CT CERVICAL SPINE WITHOUT CONTRAST TECHNIQUE: Multidetector CT imaging of the head and cervical spine was performed following the standard protocol without intravenous contrast. Multiplanar CT image reconstructions of the cervical spine were also generated. RADIATION DOSE REDUCTION: This exam was performed according to the departmental dose-optimization program which includes automated exposure control, adjustment of the mA and/or kV according to patient size and/or use of iterative reconstruction technique. COMPARISON:  06/26/2020 FINDINGS: CT HEAD FINDINGS Brain: Small chronic appearing infarcts in the left pons and inferior cerebellum, interval in the left cerebellum. Ischemic gliosis in the cerebral white matter which is generalized. Cerebral volume loss without specific pattern. No acute infarct, hydrocephalus, or masslike finding Vascular: No hyperdense vessel or unexpected calcification. Skull: No acute fracture Sinuses/Orbits: No  evidence of injury. CT CERVICAL SPINE FINDINGS Alignment: No traumatic malalignment Skull base and vertebrae: No acute fracture. No primary bone lesion or focal pathologic process. Soft tissues and spinal canal: No prevertebral fluid or swelling. No visible canal hematoma. Disc levels:  Generalized degenerative endplate and facet spurring. Upper chest: Fibrosis at the apices.  No acute finding IMPRESSION: No evidence of acute intracranial or cervical spine injury. Electronically Signed   By: Ronnette Coke M.D.   On: 11/30/2023 06:23   CT Cervical Spine Wo Contrast Result Date: 11/30/2023 CLINICAL DATA:  Head trauma, minor EXAM: CT HEAD WITHOUT CONTRAST CT CERVICAL SPINE WITHOUT CONTRAST TECHNIQUE: Multidetector CT imaging of the head and cervical spine was performed following the standard protocol without intravenous contrast. Multiplanar CT image reconstructions of the cervical spine were also generated. RADIATION DOSE REDUCTION: This exam was performed according to the departmental dose-optimization program which includes automated exposure control, adjustment of the mA and/or kV according to patient size and/or use of iterative reconstruction technique. COMPARISON:  06/26/2020 FINDINGS: CT HEAD FINDINGS Brain: Small chronic appearing infarcts in the left pons and inferior cerebellum, interval in the left cerebellum. Ischemic gliosis in the cerebral white matter which is generalized. Cerebral volume loss without specific pattern. No acute infarct, hydrocephalus, or masslike finding Vascular: No hyperdense vessel or unexpected calcification. Skull: No acute fracture Sinuses/Orbits: No evidence of injury. CT CERVICAL SPINE FINDINGS Alignment: No traumatic malalignment Skull base and vertebrae: No acute fracture. No primary bone lesion or focal pathologic process. Soft tissues and spinal canal: No prevertebral fluid or swelling. No visible canal hematoma. Disc levels:  Generalized degenerative endplate and facet spurring. Upper chest: Fibrosis at the apices.  No acute finding IMPRESSION: No evidence of acute intracranial or cervical spine injury. Electronically Signed   By: Ronnette Coke M.D.   On: 11/30/2023 06:23    Microbiology: Results for orders placed or performed in visit on 08/24/18  Urine Culture     Status: None   Collection Time: 08/24/18  9:03 AM   Specimen: Urine  Result Value Ref Range Status   MICRO NUMBER: [id number]  Final   SPECIMEN QUALITY: Adequate  Final   Sample Source URINE,  CLEAN CATCH  Final   STATUS: FINAL  Final   Result: No Growth  Final    Labs: CBC: Recent Labs  Lab 11/30/23 0550  WBC 10.2  NEUTROABS 5.2  HGB 16.1  HCT 48.3  MCV 87.2  PLT 265   Basic Metabolic Panel: Recent Labs  Lab 11/30/23 0550  NA 138  K 4.6  CL 105  CO2 23  GLUCOSE 203*  BUN 19  CREATININE 1.38*  CALCIUM 9.9   Liver Function Tests: Recent Labs  Lab 11/30/23 0550  AST 16  ALT 12  ALKPHOS 62  BILITOT 1.3*  PROT 8.1  ALBUMIN 4.1   CBG: Recent Labs  Lab 11/30/23 1111 11/30/23 1715 12/01/23 0506 12/01/23 0827  GLUCAP 154* 218* 239* 190*    Discharge time spent: greater than 30 minutes.  Signed: Tyreik Delahoussaye , MD [facility] 12/01/2023

## 2023-12-01 NOTE — Evaluation (Signed)
 Physical Therapy Evaluation Patient Details Name: Bruce Richardson MRN: [id number] DOB: Jun 17, 1951 Today's Date: 12/01/2023  History of Present Illness  73 y.o. male with medical history significant of type 2 diabetes, hypertension, hyperlipidemia, prior CVA presenting with encephalopathy.  Limited history in the setting of encephalopathy and agitation present.  History primarily from patient's daughter Lenard Forth over the phone.  Per report, patient has had a rapid decline in mentation over the past 3 to 4 days.  Per the daughter, patient usually ambulatory able to help with things around the house including take care of his wife.  Patient with noted mild confusion as well as left-sided hand shaking and agitation since Friday.  No reported recent infections.  Positive generalized weakness.  No reports of slurred speech or confusion.  Patient's behavior has changed with patient reporting being scared of his daughter as well as being very irritable.  This is a rapid change from baseline per the daughter.   Clinical Impression  Pt admitted with above diagnosis. Pt currently with functional limitations due to the deficits listed below (see PT Problem List). Pt received seated EOB eating breakfast. A&Ox4. Reports being indep at baseline but has had a couple recent falls partially leading to admission but does not mention his cognitive changes. Also reports assisting his spouse daily with ADL's/IADL's.   To date pt appearing close to baseline standing independently and ambulating ~220' without need for AD. Mildly unsteady with occasional R/L mild veering but corrects independently. Able to urinate and perform hand hygiene independently upon return to room. Educated pt on using RW or SPC due to random bouts of LE buckling leading to falls to reduce falls risk and possible benefits of f/u PT in home setting. Pt returns supine in bed. Pt left in care of OT at hand off. Pt will benefit from f/u recs as indicated due  to acute history of falls to ensure safe transition back to previous environment.       If plan is discharge home, recommend the following: A little help with walking and/or transfers;Help with stairs or ramp for entrance   Can travel by private vehicle        Equipment Recommendations Other (comment) (wants to know up front cost of RW before committing.)  Recommendations for Other Services       Functional Status Assessment Patient has had a recent decline in their functional status and demonstrates the ability to make significant improvements in function in a reasonable and predictable amount of time.     Precautions / Restrictions Precautions Precautions: Fall      Mobility  Bed Mobility Overal bed mobility: Independent               Patient Response: Cooperative  Transfers Overall transfer level: Independent                      Ambulation/Gait Ambulation/Gait assistance: Supervision Gait Distance (Feet): 220 Feet Assistive device: None Gait Pattern/deviations: Step-through pattern, Decreased step length - right, Decreased step length - left       General Gait Details: mildly unsteady reports LE weakness. No need for AD based off of current distances.  Stairs            Wheelchair Mobility     Tilt Bed Tilt Bed Patient Response: Cooperative  Modified Rankin (Stroke Patients Only)       Balance Overall balance assessment: Mild deficits observed, not formally tested  Pertinent Vitals/Pain Pain Assessment Pain Assessment: No/denies pain    Home Living Family/patient expects to be discharged to:: Private residence Living Arrangements: Spouse/significant other Available Help at Discharge: Family;Available PRN/intermittently Type of Home: House Home Access: Stairs to enter Entrance Stairs-Rails: Right Entrance Stairs-Number of Steps: 4-5   Home Layout: One  level Home Equipment: Cane - single point Additional Comments: spouse uses the RW in his house.    Prior Function Prior Level of Function : Independent/Modified Independent;Driving               ADLs Comments: Pt is Ind in self care and IADLs at home.     Extremity/Trunk Assessment   Upper Extremity Assessment Upper Extremity Assessment: Overall WFL for tasks assessed    Lower Extremity Assessment Lower Extremity Assessment: Overall WFL for tasks assessed (Pt reports LE's feeling weak.)    Cervical / Trunk Assessment Cervical / Trunk Assessment: Normal  Communication   Communication Communication: No apparent difficulties Factors Affecting Communication: Hearing impaired    Cognition Arousal: Alert Behavior During Therapy: WFL for tasks assessed/performed   PT - Cognitive impairments: No apparent impairments                         Following commands: Intact       Cueing Cueing Techniques: Verbal cues, Tactile cues     General Comments General comments (skin integrity, edema, etc.): HR 114-116 BPM    Exercises Other Exercises Other Exercises: D/c recs, benefits of AD for LE weakness and falls prevention   Assessment/Plan    PT Assessment Patient needs continued PT services  PT Problem List Decreased strength       PT Treatment Interventions DME instruction;Balance training;Gait training;Stair training;Functional mobility training;Therapeutic exercise    PT Goals (Current goals can be found in the Care Plan section)  Acute Rehab PT Goals Patient Stated Goal: return home PT Goal Formulation: With patient Time For Goal Achievement: 12/15/23 Potential to Achieve Goals: Good    Frequency Min 1X/week     Co-evaluation               AM-PAC PT "6 Clicks" Mobility  Outcome Measure Help needed turning from your back to your side while in a flat bed without using bedrails?: None Help needed moving from lying on your back to sitting on  the side of a flat bed without using bedrails?: A Little Help needed moving to and from a bed to a chair (including a wheelchair)?: A Little Help needed standing up from a chair using your arms (e.g., wheelchair or bedside chair)?: A Little Help needed to walk in hospital room?: None Help needed climbing 3-5 steps with a railing? : A Little 6 Click Score: 20    End of Session   Activity Tolerance: Patient tolerated treatment well Patient left: in bed (handoff to OT) Nurse Communication: Mobility status PT Visit Diagnosis: Unsteadiness on feet (R26.81);History of falling (Z91.81)    Time: 1610-9604 PT Time Calculation (min) (ACUTE ONLY): 15 min   Charges:   PT Evaluation $PT Eval Low Complexity: 1 Low   PT General Charges $$ ACUTE PT VISIT: 1 Visit         Delphia Grates. Fairly IV, PT, DPT Physical Therapist- [place]  [facility]  12/01/2023, 9:53 AM

## 2023-12-01 NOTE — Evaluation (Signed)
 Occupational Therapy Evaluation Patient Details Name: Bruce Richardson MRN: [id number] DOB: 1951-06-26 Today's Date: 12/01/2023   History of Present Illness   73 y.o. male with medical history significant of type 2 diabetes, hypertension, hyperlipidemia, prior CVA presenting with encephalopathy.  Limited history in the setting of encephalopathy and agitation present.  History primarily from patient's daughter Lenard Forth over the phone.  Per report, patient has had a rapid decline in mentation over the past 3 to 4 days.  Per the daughter, patient usually ambulatory able to help with things around the house including take care of his wife.  Patient with noted mild confusion as well as left-sided hand shaking and agitation since Friday.  No reported recent infections.  Positive generalized weakness.  No reports of slurred speech or confusion.  Patient's behavior has changed with patient reporting being scared of his daughter as well as being very irritable.  This is a rapid change from baseline per the daughter.     Clinical Impressions Upon entering the room, pt finishing PT evaluation and is agreeable to working with OT. Pt transitions easily without distress or concerns. Pt endorses living at home with wife and being Ind at baseline for self care, IADLs, and drives. Pt dons B shoes while seated on EOB without assistance and ambulates 500' without AD or assist. Pt able to bend down and pick up items from hallway floor with supervision for safety and no reports of dizziness. Pt returns to room at end of session. Pt does not appear to have acute OT needs at this time. OT to complete order and pt agrees with plan. Call bell and all needed items within reach.      If plan is discharge home, recommend the following:   Assist for transportation     Functional Status Assessment   Patient has not had a recent decline in their functional status     Equipment Recommendations   None recommended by  OT      Precautions/Restrictions   Precautions Precautions: Fall     Mobility Bed Mobility Overal bed mobility: Independent                  Transfers Overall transfer level: Modified independent                            ADL either performed or assessed with clinical judgement   ADL Overall ADL's : Independent                                             Vision Patient Visual Report: No change from baseline              Pertinent Vitals/Pain Pain Assessment Pain Assessment: No/denies pain     Extremity/Trunk Assessment Upper Extremity Assessment Upper Extremity Assessment: Overall WFL for tasks assessed   Lower Extremity Assessment Lower Extremity Assessment: Defer to PT evaluation       Communication Communication Communication: No apparent difficulties Factors Affecting Communication: Hearing impaired   Cognition Arousal: Alert Behavior During Therapy: WFL for tasks assessed/performed Cognition: No apparent impairments                               Following commands: Intact       Cueing  General Comments   Cueing Techniques: Verbal cues;Tactile cues              Home Living Family/patient expects to be discharged to:: Private residence Living Arrangements: Spouse/significant other Available Help at Discharge: Family;Available PRN/intermittently Type of Home: House Home Access: Stairs to enter [employer] of Steps: 4-5 Entrance Stairs-Rails: Right Home Layout: One level     Bathroom Shower/Tub: Tub/shower unit         Home Equipment: None          Prior Functioning/Environment Prior Level of Function : Independent/Modified Independent;Driving               ADLs Comments: Pt is Ind in self care and IADLs at home.            OT Goals(Current goals can be found in the care plan section)   Acute Rehab OT Goals Patient Stated Goal: to go home  today OT Goal Formulation: With patient Time For Goal Achievement: 12/01/23 Potential to Achieve Goals: Fair   OT Frequency:          AM-PAC OT "6 Clicks" Daily Activity     Outcome Measure Help from another person eating meals?: None Help from another person taking care of personal grooming?: None Help from another person toileting, which includes using toliet, bedpan, or urinal?: None Help from another person bathing (including washing, rinsing, drying)?: None Help from another person to put on and taking off regular upper body clothing?: None Help from another person to put on and taking off regular lower body clothing?: None 6 Click Score: 24   End of Session Nurse Communication: Mobility status  Activity Tolerance: Patient tolerated treatment well Patient left: in bed;with call bell/phone within reach;with bed alarm set                   Time: 9147-8295 OT Time Calculation (min): 13 min Charges:  OT General Charges $OT Visit: 1 Visit OT Evaluation $OT Eval Low Complexity: 1 Low  George Kinder, MS, OTR/L , CBIS ascom [phone]  12/01/23, 9:36 AM

## 2023-12-12 ENCOUNTER — Encounter: Payer: Self-pay | Admitting: Internal Medicine

## 2023-12-15 ENCOUNTER — Other Ambulatory Visit: Payer: Self-pay | Admitting: Internal Medicine

## 2023-12-15 DIAGNOSIS — J432 Centrilobular emphysema: Secondary | ICD-10-CM

## 2023-12-15 MED ORDER — BREZTRI AEROSPHERE 160-9-4.8 MCG/ACT IN AERO: 2.0000 | INHALATION_SPRAY | Freq: Two times a day (BID) | RESPIRATORY_TRACT | 0 refills | Status: DC

## 2023-12-15 NOTE — Telephone Encounter (Signed)
 Please review.  KP

## 2023-12-17 NOTE — Telephone Encounter (Signed)
 Please review.  KP

## 2023-12-22 NOTE — Telephone Encounter (Signed)
 Pt responded .

## 2023-12-23 NOTE — Telephone Encounter (Signed)
 Please review.  KP

## 2023-12-24 NOTE — Telephone Encounter (Signed)
 Please review PT response.  JM

## 2023-12-31 ENCOUNTER — Other Ambulatory Visit: Payer: Self-pay | Admitting: Internal Medicine

## 2023-12-31 DIAGNOSIS — J432 Centrilobular emphysema: Secondary | ICD-10-CM

## 2023-12-31 MED ORDER — ALBUTEROL SULFATE HFA 108 (90 BASE) MCG/ACT IN AERS: 2.0000 | INHALATION_SPRAY | RESPIRATORY_TRACT | 0 refills | Status: DC | PRN

## 2023-12-31 NOTE — Telephone Encounter (Signed)
 Please review and advise.   JM

## 2023-12-31 NOTE — Telephone Encounter (Signed)
 Please review.  KP

## 2023-12-31 NOTE — Progress Notes (Unsigned)
 Date:  12/31/2023   Name:  Bruce Richardson   DOB:  1950/09/12   MRN:  [id number]   Chief Complaint: No chief complaint on file.  HPI  Review of Systems   Lab Results  Component Value Date   NA 138 11/30/2023   K 4.6 11/30/2023   CO2 23 11/30/2023   GLUCOSE 203 (H) 11/30/2023   BUN 19 11/30/2023   CREATININE 1.38 (H) 11/30/2023   CALCIUM  9.9 11/30/2023   EGFR 67 05/20/2023   GFRNONAA 54 (L) 11/30/2023   Lab Results  Component Value Date   CHOL 104 12/01/2023   HDL 30 (L) 12/01/2023   LDLCALC 55 12/01/2023   TRIG 94 12/01/2023   CHOLHDL 3.5 12/01/2023   Lab Results  Component Value Date   TSH 3.392 11/30/2023   Lab Results  Component Value Date   HGBA1C 7.8 (A) 10/24/2023   Lab Results  Component Value Date   WBC 10.2 11/30/2023   HGB 16.1 11/30/2023   HCT 48.3 11/30/2023   MCV 87.2 11/30/2023   PLT 265 11/30/2023   Lab Results  Component Value Date   ALT 12 11/30/2023   AST 16 11/30/2023   ALKPHOS 62 11/30/2023   BILITOT 1.3 (H) 11/30/2023   No results found for: "25OHVITD2", "25OHVITD3", "VD25OH"   Patient Active Problem List   Diagnosis Date Noted   Encephalopathy 11/30/2023   Primary insomnia 05/20/2023   Eczema 11/14/2021   BPPV (benign paroxysmal positional vertigo), unspecified laterality 12/24/2020   Chronic diastolic congestive heart failure (HCC) 11/29/2019   Centrilobular emphysema (HCC) 01/26/2019   Aortic atherosclerosis (HCC) 01/26/2019   Echocardiogram shows left ventricular diastolic dysfunction 02/16/2018   Erectile dysfunction 02/16/2018   History of CVA (cerebrovascular accident) without residual deficits 08/13/2017   BPH without obstruction/lower urinary tract symptoms 03/19/2017   Chronic bilateral low back pain 11/14/2016   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   GERD (gastroesophageal reflux disease) 04/11/2016   Obesity 04/11/2016   Allergic rhinitis 08/04/2015   Essential hypertension 07/04/2015    Hyperlipidemia due to type 2 diabetes mellitus (HCC) 07/04/2015   Type II diabetes mellitus with complication (HCC) 07/04/2015    No Known Allergies  Past Surgical History:  Procedure Laterality Date   CIRCUMCISION N/A 07/24/2015   Procedure: CIRCUMCISION ADULT;  Surgeon: Bart Born, MD;  Location: [facility];  Service: Urology;  Laterality: N/A;   EYE SURGERY Right    right eye cataract surgery    SHOULDER ARTHROSCOPY Bilateral    SHOULDER ARTHROSCOPY Right 12/22/2014   Procedure: RIGHT SHOULDER ARTHROSCOPY /DECOMPRESSION/ROTATOR CUFF REPAIR OF RECURRENT ROTATOR CUFF TEAR;  Surgeon: Elner Hahn, MD;  Location: [facility] ORS;  Service: Orthopedics;  Laterality: Right;    Social History   Tobacco Use   Smoking status: Former    Current packs/day: 0.00    Average packs/day: 1.5 packs/day for 25.0 years (37.6 ttl pk-yrs)    Types: Cigarettes    Start date: 12/19/2001    Quit date: 07/03/2002    Years since quitting: 21.5   Smokeless tobacco: Former   Tobacco comments:    quit smoking in 06/2001  Vaping Use   Vaping status: Never Used  Substance Use Topics   Alcohol use: No    Alcohol/week: 0.0 standard drinks of alcohol   Drug use: No     Medication list has been reviewed and updated.  No outpatient medications have been marked as taking for the 12/31/23 encounter (Orders  Only) with Sheron Dixons, MD.       10/24/2023    9:51 AM 03/11/2023   10:24 AM 05/10/2022    8:17 AM 11/14/2021    9:44 AM  GAD 7 : Generalized Anxiety Score  Nervous, Anxious, on Edge 1 0 1 1  Control/stop worrying 0 0 0 0  Worry too much - different things 1 0 0 2  Trouble relaxing 3 0 1 2  Restless 2 0 1 1  Easily annoyed or irritable 2 0 0 2  Afraid - awful might happen 0 0 0 0  Total GAD 7 Score 9 0 3 8  Anxiety Difficulty Somewhat difficult Not difficult at all Not difficult at all        11/05/2023    8:19 AM 10/24/2023    9:50 AM 03/11/2023   10:23 AM  Depression screen PHQ 2/9   Decreased Interest 2 1 3   Down, Depressed, Hopeless 3 0 3  PHQ - 2 Score 5 1 6   Altered sleeping 0 3 3  Tired, decreased energy 1 2 3   Change in appetite 0 1 1  Feeling bad or failure about yourself  0 1 2  Trouble concentrating 0 1 0  Moving slowly or fidgety/restless 0 1 0  Suicidal thoughts 0 0 0  PHQ-9 Score 6 10 15   Difficult doing work/chores Not difficult at all Somewhat difficult Somewhat difficult    BP Readings from Last 3 Encounters:  12/01/23 131/68  10/24/23 122/72  05/20/23 124/76    Physical Exam  Wt Readings from Last 3 Encounters:  11/30/23 224 lb 6.9 oz (101.8 kg)  10/24/23 230 lb 2 oz (104.4 kg)  05/20/23 230 lb (104.3 kg)    There were no vitals taken for this visit.  Assessment and Plan:  Problem List Items Addressed This Visit   None   No follow-ups on file.    Sheron Dixons, MD [facility] [place]

## 2024-01-24 DIAGNOSIS — E669 Obesity, unspecified: Secondary | ICD-10-CM | POA: Diagnosis not present

## 2024-01-24 DIAGNOSIS — I700 Atherosclerosis of aorta: Secondary | ICD-10-CM | POA: Diagnosis not present

## 2024-01-24 DIAGNOSIS — K219 Gastro-esophageal reflux disease without esophagitis: Secondary | ICD-10-CM | POA: Diagnosis not present

## 2024-01-24 DIAGNOSIS — I509 Heart failure, unspecified: Secondary | ICD-10-CM | POA: Diagnosis not present

## 2024-01-24 DIAGNOSIS — J439 Emphysema, unspecified: Secondary | ICD-10-CM | POA: Diagnosis not present

## 2024-01-24 DIAGNOSIS — I130 Hypertensive heart and chronic kidney disease with heart failure and stage 1 through stage 4 chronic kidney disease, or unspecified chronic kidney disease: Secondary | ICD-10-CM | POA: Diagnosis not present

## 2024-01-24 DIAGNOSIS — Z8249 Family history of ischemic heart disease and other diseases of the circulatory system: Secondary | ICD-10-CM | POA: Diagnosis not present

## 2024-01-24 DIAGNOSIS — R32 Unspecified urinary incontinence: Secondary | ICD-10-CM | POA: Diagnosis not present

## 2024-01-24 DIAGNOSIS — Z008 Encounter for other general examination: Secondary | ICD-10-CM | POA: Diagnosis not present

## 2024-01-24 DIAGNOSIS — Z7982 Long term (current) use of aspirin: Secondary | ICD-10-CM | POA: Diagnosis not present

## 2024-01-24 DIAGNOSIS — E1159 Type 2 diabetes mellitus with other circulatory complications: Secondary | ICD-10-CM | POA: Diagnosis not present

## 2024-01-24 DIAGNOSIS — F1021 Alcohol dependence, in remission: Secondary | ICD-10-CM | POA: Diagnosis not present

## 2024-01-24 DIAGNOSIS — E1122 Type 2 diabetes mellitus with diabetic chronic kidney disease: Secondary | ICD-10-CM | POA: Diagnosis not present

## 2024-02-24 ENCOUNTER — Encounter: Payer: Self-pay | Admitting: Internal Medicine

## 2024-02-24 ENCOUNTER — Ambulatory Visit (INDEPENDENT_AMBULATORY_CARE_PROVIDER_SITE_OTHER): Admitting: Internal Medicine

## 2024-02-24 VITALS — BP 122/72 | HR 91 | Ht 74.0 in | Wt 221.0 lb

## 2024-02-24 DIAGNOSIS — Z7984 Long term (current) use of oral hypoglycemic drugs: Secondary | ICD-10-CM | POA: Diagnosis not present

## 2024-02-24 DIAGNOSIS — Z1211 Encounter for screening for malignant neoplasm of colon: Secondary | ICD-10-CM | POA: Diagnosis not present

## 2024-02-24 DIAGNOSIS — E118 Type 2 diabetes mellitus with unspecified complications: Secondary | ICD-10-CM | POA: Diagnosis not present

## 2024-02-24 DIAGNOSIS — I10 Essential (primary) hypertension: Secondary | ICD-10-CM

## 2024-02-24 LAB — POCT GLYCOSYLATED HEMOGLOBIN (HGB A1C): Hemoglobin A1C: 6.7 % — AB (ref 4.0–5.6)

## 2024-02-24 MED ORDER — METFORMIN HCL ER 500 MG PO TB24: 1000.0000 mg | ORAL_TABLET | Freq: Every day | ORAL | 1 refills | Status: AC

## 2024-02-24 NOTE — Progress Notes (Signed)
 Date:  02/24/2024   Name:  Bruce Richardson   DOB:  Sep 22, 1950   MRN:  [id number]   Chief Complaint: Diabetes  Diabetes He presents for his follow-up diabetic visit. He has type 2 diabetes mellitus. Pertinent negatives for hypoglycemia include no headaches or tremors. Pertinent negatives for diabetes include no chest pain, no fatigue, no polydipsia and no polyuria. Current diabetic treatments: Jardiance , glipizide  and MTF.  Hypertension This is a chronic problem. The problem is controlled. Pertinent negatives include no chest pain, headaches, palpitations or shortness of breath.    Review of Systems  Constitutional:  Negative for appetite change, fatigue and unexpected weight change.  Eyes:  Negative for visual disturbance.  Respiratory:  Negative for cough, shortness of breath and wheezing.   Cardiovascular:  Negative for chest pain, palpitations and leg swelling.  Gastrointestinal:  Negative for abdominal pain and blood in stool.  Endocrine: Negative for polydipsia and polyuria.  Genitourinary:  Negative for dysuria and hematuria.  Skin:  Negative for color change and rash.  Neurological:  Negative for tremors, numbness and headaches.  Psychiatric/Behavioral:  Negative for dysphoric mood.      Lab Results  Component Value Date   NA 138 11/30/2023   K 4.6 11/30/2023   CO2 23 11/30/2023   GLUCOSE 203 (H) 11/30/2023   BUN 19 11/30/2023   CREATININE 1.38 (H) 11/30/2023   CALCIUM  9.9 11/30/2023   EGFR 67 05/20/2023   GFRNONAA 54 (L) 11/30/2023   Lab Results  Component Value Date   CHOL 104 12/01/2023   HDL 30 (L) 12/01/2023   LDLCALC 55 12/01/2023   TRIG 94 12/01/2023   CHOLHDL 3.5 12/01/2023   Lab Results  Component Value Date   TSH 3.392 11/30/2023   Lab Results  Component Value Date   HGBA1C 6.7 (A) 02/24/2024   Lab Results  Component Value Date   WBC 10.2 11/30/2023   HGB 16.1 11/30/2023   HCT 48.3 11/30/2023   MCV 87.2 11/30/2023   PLT 265 11/30/2023    Lab Results  Component Value Date   ALT 12 11/30/2023   AST 16 11/30/2023   ALKPHOS 62 11/30/2023   BILITOT 1.3 (H) 11/30/2023   No results found for: MARIEN BOLLS, VD25OH   Patient Active Problem List   Diagnosis Date Noted   Encephalopathy 11/30/2023   Primary insomnia 05/20/2023   Eczema 11/14/2021   BPPV (benign paroxysmal positional vertigo), unspecified laterality 12/24/2020   Chronic diastolic congestive heart failure (HCC) 11/29/2019   Centrilobular emphysema (HCC) 01/26/2019   Aortic atherosclerosis (HCC) 01/26/2019   Echocardiogram shows left ventricular diastolic dysfunction 02/16/2018   Erectile dysfunction 02/16/2018   History of CVA (cerebrovascular accident) without residual deficits 08/13/2017   BPH without obstruction/lower urinary tract symptoms 03/19/2017   Chronic bilateral low back pain 11/14/2016   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   GERD (gastroesophageal reflux disease) 04/11/2016   Obesity 04/11/2016   Allergic rhinitis 08/04/2015   Essential hypertension 07/04/2015   Hyperlipidemia due to type 2 diabetes mellitus (HCC) 07/04/2015   Type II diabetes mellitus with complication (HCC) 07/04/2015    No Known Allergies  Past Surgical History:  Procedure Laterality Date   CIRCUMCISION N/A 07/24/2015   Procedure: CIRCUMCISION ADULT;  Surgeon: Redell Lynwood Napoleon, MD;  Location: [facility];  Service: Urology;  Laterality: N/A;   EYE SURGERY Right    right eye cataract surgery    SHOULDER ARTHROSCOPY Bilateral    SHOULDER ARTHROSCOPY Right  12/22/2014   Procedure: RIGHT SHOULDER ARTHROSCOPY /DECOMPRESSION/ROTATOR CUFF REPAIR OF RECURRENT ROTATOR CUFF TEAR;  Surgeon: Norleen JINNY Maltos, MD;  Location: [facility];  Service: Orthopedics;  Laterality: Right;    Social History   Tobacco Use   Smoking status: Former    Current packs/day: 0.00    Average packs/day: 1.5 packs/day for 25.0 years (37.6 ttl pk-yrs)    Types:  Cigarettes    Start date: 12/19/2001    Quit date: 07/03/2002    Years since quitting: 21.6   Smokeless tobacco: Former   Tobacco comments:    quit smoking in 06/2001  Vaping Use   Vaping status: Never Used  Substance Use Topics   Alcohol use: No    Alcohol/week: 0.0 standard drinks of alcohol   Drug use: No     Medication list has been reviewed and updated.  Current Meds  Medication Sig   albuterol  (VENTOLIN  HFA) 108 (90 Base) MCG/ACT inhaler Inhale 2 puffs into the lungs every 4 (four) hours as needed for wheezing or shortness of breath.   amLODipine  (NORVASC ) 5 MG tablet Take 1 tablet (5 mg total) by mouth daily.   aspirin  EC 81 MG tablet Take 1 tablet (81 mg total) by mouth daily.   clotrimazole -betamethasone  (LOTRISONE ) cream Apply 1 Application topically 2 (two) times daily. Apply twice a day to groin rash   empagliflozin  (JARDIANCE ) 25 MG TABS tablet Take 1 tablet (25 mg total) by mouth daily. Before breakfast   fluticasone  (FLONASE ) 50 MCG/ACT nasal spray Place 2 sprays into both nostrils daily.   glipiZIDE  (GLUCOTROL  XL) 2.5 MG 24 hr tablet Take 1 tablet (2.5 mg total) by mouth daily with breakfast.   glucose blood test strip Check blood sugar twice daily. Dx.E11.9   lisinopril  (ZESTRIL ) 40 MG tablet Take 1 tablet (40 mg total) by mouth daily.   metFORMIN  (GLUCOPHAGE -XR) 500 MG 24 hr tablet Take 2 tablets (1,000 mg total) by mouth daily with breakfast.   ONETOUCH DELICA LANCETS 33G MISC 1 each by Does not apply route 2 (two) times daily. Dx: E11.9   pantoprazole  (PROTONIX ) 40 MG tablet Take 1 tablet (40 mg total) by mouth daily.   rosuvastatin  (CRESTOR ) 40 MG tablet Take 1 tablet (40 mg total) by mouth daily.   tamsulosin  (FLOMAX ) 0.4 MG CAPS capsule Take 1 capsule (0.4 mg total) by mouth daily after supper.   [DISCONTINUED] metFORMIN  (GLUCOPHAGE ) 1000 MG tablet Take 1 tablet (1,000 mg total) by mouth 2 (two) times daily with a meal.       02/24/2024   11:16 AM 10/24/2023     9:51 AM 03/11/2023   10:24 AM 05/10/2022    8:17 AM  GAD 7 : Generalized Anxiety Score  Nervous, Anxious, on Edge 0 1 0 1  Control/stop worrying 0 0 0 0  Worry too much - different things 0 1 0 0  Trouble relaxing 0 3 0 1  Restless 0 2 0 1  Easily annoyed or irritable 0 2 0 0  Afraid - awful might happen 0 0 0 0  Total GAD 7 Score 0 9 0 3  Anxiety Difficulty Not difficult at all Somewhat difficult Not difficult at all Not difficult at all       02/24/2024   11:16 AM 11/05/2023    8:19 AM 10/24/2023    9:50 AM  Depression screen PHQ 2/9  Decreased Interest 1 2 1   Down, Depressed, Hopeless 1 3 0  PHQ - 2 Score 2 5  1  Altered sleeping 0 0 3  Tired, decreased energy 0 1 2  Change in appetite 0 0 1  Feeling bad or failure about yourself  0 0 1  Trouble concentrating 0 0 1  Moving slowly or fidgety/restless 0 0 1  Suicidal thoughts 0 0 0  PHQ-9 Score 2 6 10   Difficult doing work/chores Not difficult at all Not difficult at all Somewhat difficult    BP Readings from Last 3 Encounters:  02/24/24 122/72  12/01/23 131/68  10/24/23 122/72    Physical Exam Vitals and nursing note reviewed.  Constitutional:      General: He is not in acute distress.    Appearance: He is well-developed.  HENT:     Head: Normocephalic and atraumatic.  Neck:     Vascular: No carotid bruit.  Cardiovascular:     Rate and Rhythm: Normal rate and regular rhythm.     Heart sounds: No murmur heard. Pulmonary:     Effort: Pulmonary effort is normal. No respiratory distress.     Breath sounds: No wheezing or rhonchi.  Musculoskeletal:     Cervical back: Normal range of motion.     Right lower leg: No edema.     Left lower leg: No edema.  Lymphadenopathy:     Cervical: No cervical adenopathy.  Skin:    General: Skin is warm and dry.     Findings: No rash.  Neurological:     Mental Status: He is alert and oriented to person, place, and time.  Psychiatric:        Mood and Affect: Mood normal.         Behavior: Behavior normal.     Wt Readings from Last 3 Encounters:  02/24/24 221 lb (100.2 kg)  11/30/23 224 lb 6.9 oz (101.8 kg)  10/24/23 230 lb 2 oz (104.4 kg)    BP 122/72   Pulse 91   Ht 6' 2 (1.88 m)   Wt 221 lb (100.2 kg)   SpO2 94%   BMI 28.37 kg/m   Assessment and Plan:  Problem List Items Addressed This Visit       Unprioritized   Essential hypertension - Primary (Chronic)   Blood pressure is well controlled.  Current medications are amlodipine  and lisinopril . Will continue same regimen along with efforts to limit dietary sodium.       Type II diabetes mellitus with complication (HCC) (Chronic)   Blood sugars have been stable.  No recent hypoglycemic events requiring assistance. Currently medications are MTF, Jardiance  and glipzide. Lab Results  Component Value Date   HGBA1C 6.7 (A) 02/24/2024   Last visit no changes were made.  A1C improved today. MTF is causing multiple loose stools per day - will change to ER formulation Eye exam scheduled in August.       Relevant Medications   metFORMIN  (GLUCOPHAGE -XR) 500 MG 24 hr tablet   Other Relevant Orders   POCT glycosylated hemoglobin (Hb A1C) (Completed)   Other Visit Diagnoses       Long term current use of oral hypoglycemic drug         Colon cancer screening       Relevant Orders   Ambulatory referral to Gastroenterology       Return in about 4 months (around 06/26/2024) for DM, HTN.    Leita HILARIO Adie, MD [facility] [place]

## 2024-02-24 NOTE — Assessment & Plan Note (Addendum)
 Blood sugars have been stable.  No recent hypoglycemic events requiring assistance. Currently medications are MTF, Jardiance  and glipzide. Lab Results  Component Value Date   HGBA1C 6.7 (A) 02/24/2024   Last visit no changes were made.  A1C improved today. MTF is causing multiple loose stools per day - will change to ER formulation Eye exam scheduled in August.

## 2024-02-24 NOTE — Assessment & Plan Note (Signed)
 Blood pressure is well controlled.  Current medications are amlodipine  and lisinopril . Will continue same regimen along with efforts to limit dietary sodium.

## 2024-03-05 ENCOUNTER — Ambulatory Visit: Admitting: Pulmonary Disease

## 2024-03-05 ENCOUNTER — Encounter: Payer: Self-pay | Admitting: Pulmonary Disease

## 2024-03-05 VITALS — BP 128/62 | HR 76 | Temp 98.5°F | Ht 71.0 in | Wt 225.2 lb

## 2024-03-05 DIAGNOSIS — Z87891 Personal history of nicotine dependence: Secondary | ICD-10-CM | POA: Diagnosis not present

## 2024-03-05 DIAGNOSIS — J849 Interstitial pulmonary disease, unspecified: Secondary | ICD-10-CM

## 2024-03-05 DIAGNOSIS — R053 Chronic cough: Secondary | ICD-10-CM | POA: Diagnosis not present

## 2024-03-05 DIAGNOSIS — R0602 Shortness of breath: Secondary | ICD-10-CM | POA: Diagnosis not present

## 2024-03-05 DIAGNOSIS — J432 Centrilobular emphysema: Secondary | ICD-10-CM

## 2024-03-05 LAB — NITRIC OXIDE: Nitric Oxide: 13

## 2024-03-05 MED ORDER — TRELEGY ELLIPTA 100-62.5-25 MCG/ACT IN AEPB: 1.0000 | INHALATION_SPRAY | Freq: Every day | RESPIRATORY_TRACT | Status: AC

## 2024-03-05 NOTE — Progress Notes (Signed)
 Subjective:    Patient ID: Bruce Richardson, male    DOB: 10/30/1950, 73 y.o.   MRN: [id number]  Patient Care Team: Justus Leita DEL, MD as PCP - General (Internal Medicine) Mevelyn JONETTA Bathe, OD (Optometry) Celesta Reusing, NP as Nurse Practitioner (Neurosurgery) Maree Jannett POUR, MD as Consulting Physician (Neurology) Alana, Sharyle LABOR, RPH-CPP as Pharmacist  Chief Complaint  Patient presents with   Cough    Coughing and hard to get a good breath in.    BACKGROUND: Patient presents for evaluation of cough and shortness of breath he is kindly referred by Dr. Leita Justus.   HPI Discussed the use of AI scribe software for clinical note transcription with the patient, who gave verbal consent to proceed.  History of Present Illness   Bruce Richardson is a 73 year old male who presents with shortness of breath and cough. He was referred by Dr. Leita Justus for evaluation of his respiratory symptoms.  He is a very challenging historian.  He was evaluated previously by Dr. Lavelle Servant.  He has been experiencing shortness of breath and cough for over a year. The cough occurs frequently throughout the day, particularly when exposed to certain smells. He has a history of smoking but quit 30 years ago. 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  He has been using an albuterol  inhaler, but it does not provide significant relief. In April 2020, he had a chest x-ray, but he is uncertain about the findings. He recalls having a CT scan in 2020 but is uncertain about the findings.  No wheezing, fever, or night sweats. He denies any [employer] and is unsure of any family history of lung issues, as 'people didn't talk about their history' back then.   He does not endorse any fevers, chills or sweats.  Cough is dry and nonproductive.  No hemoptysis.  No orthopnea or paroxysmal nocturnal dyspnea.  Does have  issues with chronic diastolic dysfunction.  Most recent chest x-ray was performed in April 2025 was a portable film that was consistent with evidence of potential interstitial lung disease with low lung volumes.  Echocardiogram in April 2025 was consistent with diastolic dysfunction.  He does not endorse any chest pain.  Review of Dr. Aletha records show that he initially saw the patient in January 2020 for a cough.  PFTs at that time were consistent with possible restriction and decreased diffusion capacity.  There was no distinct etiology given for the cough.  Cough eventually resolved and the patient was instructed to follow-up as needed.  A CT angio performed in 2020 showed centrilobular emphysematous change, no PE, suspected smoking-related parenchymal abnormalities/fibrosis.  I have reviewed the patient's old records as well as prior imaging.    Review of Systems A 10 point review of systems was performed and it is as noted above otherwise negative.   Past Medical History:  Diagnosis Date   Arthritis    Bulging lumbar disc    lower back   Complete rotator cuff rupture of left shoulder 09/23/2014   Complication of anesthesia    kept moving while under anesthesia during colonoscopy, hard to put under.   Diabetes mellitus without complication (HCC)    GERD (gastroesophageal reflux disease)    History of repair of rotator cuff 08/31/2014   Hyperlipidemia    Hypertension    Injury of tendon of upper extremity 12/23/2014  Moderate episode of recurrent major depressive disorder (HCC) 02/16/2018   Non-retracting foreskin 07/04/2015   Stroke (HCC)    Tendinitis of elbow or forearm 04/26/2015    Past Surgical History:  Procedure Laterality Date   CIRCUMCISION N/A 07/24/2015   Procedure: CIRCUMCISION ADULT;  Surgeon: Redell Lynwood Napoleon, MD;  Location: [facility] ORS;  Service: Urology;  Laterality: N/A;   EYE SURGERY Right    right eye cataract surgery    SHOULDER ARTHROSCOPY Bilateral     SHOULDER ARTHROSCOPY Right 12/22/2014   Procedure: RIGHT SHOULDER ARTHROSCOPY /DECOMPRESSION/ROTATOR CUFF REPAIR OF RECURRENT ROTATOR CUFF TEAR;  Surgeon: Norleen JINNY Maltos, MD;  Location: [facility] ORS;  Service: Orthopedics;  Laterality: Right;    Patient Active Problem List   Diagnosis Date Noted   Encephalopathy 11/30/2023   Primary insomnia 05/20/2023   Eczema 11/14/2021   BPPV (benign paroxysmal positional vertigo), unspecified laterality 12/24/2020   Chronic diastolic congestive heart failure (HCC) 11/29/2019   Centrilobular emphysema (HCC) 01/26/2019   Aortic atherosclerosis (HCC) 01/26/2019   Echocardiogram shows left ventricular diastolic dysfunction 02/16/2018   Erectile dysfunction 02/16/2018   History of CVA (cerebrovascular accident) without residual deficits 08/13/2017   BPH without obstruction/lower urinary tract symptoms 03/19/2017   Chronic bilateral low back pain 11/14/2016   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   GERD (gastroesophageal reflux disease) 04/11/2016   Obesity 04/11/2016   Allergic rhinitis 08/04/2015   Essential hypertension 07/04/2015   Hyperlipidemia due to type 2 diabetes mellitus (HCC) 07/04/2015   Type II diabetes mellitus with complication (HCC) 07/04/2015    Family History  Problem Relation Age of Onset   Alzheimer's disease Mother    Diabetes Mother    Hypertension Mother    Cancer Father    Heart failure Brother    Diabetes Brother    Hypertension Brother    Heart disease Brother    Prostate cancer Other        unknown   Bladder Cancer Other        unknown   Heart disease Son    Diabetes Sister     Social History   Tobacco Use   Smoking status: Former    Current packs/day: 0.00    Average packs/day: 1.5 packs/day for 25.0 years (37.6 ttl pk-yrs)    Types: Cigarettes    Start date: 12/19/2001    Quit date: 07/03/2002    Years since quitting: 21.6   Smokeless tobacco: Former   Tobacco comments:    quit smoking  in 06/2001  Substance Use Topics   Alcohol use: No    Alcohol/week: 0.0 standard drinks of alcohol    No Known Allergies  Current Meds  Medication Sig   albuterol  (VENTOLIN  HFA) 108 (90 Base) MCG/ACT inhaler Inhale 2 puffs into the lungs every 4 (four) hours as needed for wheezing or shortness of breath.   amLODipine  (NORVASC ) 5 MG tablet Take 1 tablet (5 mg total) by mouth daily.   aspirin  EC 81 MG tablet Take 1 tablet (81 mg total) by mouth daily.   clotrimazole -betamethasone  (LOTRISONE ) cream Apply 1 Application topically 2 (two) times daily. Apply twice a day to groin rash   empagliflozin  (JARDIANCE ) 25 MG TABS tablet Take 1 tablet (25 mg total) by mouth daily. Before breakfast   fluticasone  (FLONASE ) 50 MCG/ACT nasal spray Place 2 sprays into both nostrils daily.   glipiZIDE  (GLUCOTROL  XL) 2.5 MG 24 hr tablet Take 1 tablet (2.5 mg total) by mouth daily with breakfast.  glucose blood test strip Check blood sugar twice daily. Dx.E11.9   lisinopril  (ZESTRIL ) 40 MG tablet Take 1 tablet (40 mg total) by mouth daily.   metFORMIN  (GLUCOPHAGE -XR) 500 MG 24 hr tablet Take 2 tablets (1,000 mg total) by mouth daily with breakfast.   ONETOUCH DELICA LANCETS 33G MISC 1 each by Does not apply route 2 (two) times daily. Dx: E11.9   pantoprazole  (PROTONIX ) 40 MG tablet Take 1 tablet (40 mg total) by mouth daily.   rosuvastatin  (CRESTOR ) 40 MG tablet Take 1 tablet (40 mg total) by mouth daily.   tamsulosin  (FLOMAX ) 0.4 MG CAPS capsule Take 1 capsule (0.4 mg total) by mouth daily after supper.    Immunization History  Administered Date(s) Administered   Moderna Sars-Covid-2 Vaccination 11/15/2019, 04/25/2020, 05/23/2020        Objective:     BP 128/62   Pulse 76   Temp 98.5 F (36.9 C) (Oral)   Ht 5' 11 (1.803 m)   Wt 225 lb 3.2 oz (102.2 kg)   SpO2 97%   BMI 31.41 kg/m   SpO2: 97 %  GENERAL: Obese gentleman, no acute distress, fully ambulatory, no conversational dyspnea. HEAD:  Normocephalic, atraumatic.  EYES: Pupils equal, round, reactive to light.  No scleral icterus.  MOUTH: Poor dentition, partial uppers.  Oral mucosa moist.  No thrush. NECK: Supple. No thyromegaly. Trachea midline. No JVD.  No adenopathy. PULMONARY: Good air entry bilaterally.  Coarse, otherwise, no adventitious sounds. CARDIOVASCULAR: S1 and S2. Regular rate and rhythm with occasional extrasystoles.  No rubs, murmurs or gallops heard. ABDOMEN: Obese, otherwise benign. MUSCULOSKELETAL: No joint deformity, no clubbing, no edema.  NEUROLOGIC: No overt focal deficit, no gait disturbance, speech is fluent. SKIN: Intact,warm,dry. PSYCH: Taciturn.  Lab Results  Component Value Date   NITRICOXIDE 13 03/05/2024  *No evidence of type II inflammation     Assessment & Plan:     ICD-10-CM   1. ILD (interstitial lung disease) (HCC)  J84.9 CT CHEST HIGH RESOLUTION    Pulmonary function test    2. Centrilobular emphysema (HCC)  J43.2     3. Chronic cough  R05.3 CT CHEST HIGH RESOLUTION    Pulmonary function test    Nitric oxide     4. Shortness of breath  R06.02 CT CHEST HIGH RESOLUTION    Pulmonary function test      Orders Placed This Encounter  Procedures   CT CHEST HIGH RESOLUTION    Standing Status:   Future    Expected Date:   03/19/2024    Expiration Date:   03/05/2025    Preferred imaging location?:   [facility]   Nitric oxide    Pulmonary function test    Standing Status:   Future    Expiration Date:   03/05/2025    Where should this test be performed?:   Outpatient Pulmonary    What type of PFT is being ordered?:   Full PFT   Meds ordered this encounter  Medications   Fluticasone -Umeclidin-Vilant (TRELEGY ELLIPTA ) 100-62.5-25 MCG/ACT AEPB    Sig: Inhale 1 puff into the lungs daily.    Lot Number?:   [id number]   Discussion:    Chronic interstitial lung disease Chronic interstitial lung disease with low lung volumes on chest x-ray. Symptoms include dyspnea and cough,  exacerbated by certain odors. Previous chest x-ray from 2020 showed early scarring suggestive of interstitial lung disease or pulmonary fibrosis. No airway inflammation detected. Differential diagnosis includes idiopathic pulmonary fibrosis. Treatment options are  limited compared to COPD and the condition is challenging to manage.  He may very well have a combination of pulmonary fibrosis with emphysema (CPFE). - Order high-resolution CT chest - Schedule pulmonary function tests to evaluate lung function - Provide sample inhaler for trial use once daily (Trelegy 100, 1 inhalation daily) - Instruct to rinse mouth after inhaler use - Continue albuterol  inhaler as needed - Schedule follow-up appointment in 6-8 weeks  Cough Suspect related to ILD/poorly managed COPD. - Trial of Trelegy 100 - Assess as above    Advised if symptoms do not improve or worsen, to please contact office for sooner follow up or seek emergency care.    I spent 60 minutes of dedicated to the care of this patient on the date of this encounter to include pre-visit review of records, face-to-face time with the patient discussing conditions above, post visit ordering of testing, clinical documentation with the electronic health record, making appropriate referrals as documented, and communicating necessary findings to members of the patients care team.   C. Leita Sanders, MD Advanced Bronchoscopy PCCM [facility]    *This note was dictated using voice recognition software/Dragon.  Despite best efforts to proofread, errors can occur which can change the meaning. Any transcriptional errors that result from this process are unintentional and may not be fully corrected at the time of dictation.

## 2024-03-05 NOTE — Patient Instructions (Signed)
 VISIT SUMMARY:  You came in today because of shortness of breath and a persistent cough. We discussed your symptoms, your history of smoking, and your past work exposures. We also reviewed your previous chest x-ray and CT scan results.  YOUR PLAN:  -CHRONIC INTERSTITIAL LUNG DISEASE: Chronic interstitial lung disease is a condition where the lung tissue becomes scarred and stiff, making it difficult to breathe. We will order a CT scan of your chest without contrast to assess the extent of lung scarring and schedule pulmonary function tests to evaluate your lung function. You will try a sample inhaler (Trelegy) 1 puff once daily and continue using your albuterol  inhaler as needed. Please remember to rinse your mouth after using the Trelegy inhaler. We will see you again in 6-8 weeks to review your progress.  -IRREGULAR HEARTBEAT: An irregular heartbeat means that your heart is not beating in a regular rhythm. We detected this during your examination today. Further evaluation may be necessary to determine the cause and appropriate management.  INSTRUCTIONS:  Please schedule a CT scan of your chest without contrast and pulmonary function tests. Continue using your albuterol  inhaler as needed and try the sample inhaler once daily, remembering to rinse your mouth after use. Schedule a follow-up appointment in 6-8 weeks.

## 2024-03-06 ENCOUNTER — Encounter: Payer: Self-pay | Admitting: Pulmonary Disease

## 2024-03-08 NOTE — Progress Notes (Signed)
 [facility] Quality Team Note  Name: Bruce Richardson Date of Birth: April 09, 1951 MRN: [id number] Date: 03/08/2024  [facility] Quality Team has reviewed this patient's chart, please see recommendations below:  [facility] Quality Other; (CHART REVIEWED FOR CBP. ABSTRACTED 03/05/2024 BLOOD PRESSURE FOR GAP CLOSURE)

## 2024-03-25 ENCOUNTER — Encounter: Payer: Self-pay | Admitting: Internal Medicine

## 2024-03-25 DIAGNOSIS — E119 Type 2 diabetes mellitus without complications: Secondary | ICD-10-CM | POA: Diagnosis not present

## 2024-03-25 DIAGNOSIS — H2512 Age-related nuclear cataract, left eye: Secondary | ICD-10-CM | POA: Diagnosis not present

## 2024-03-25 DIAGNOSIS — Z0100 Encounter for examination of eyes and vision without abnormal findings: Secondary | ICD-10-CM | POA: Diagnosis not present

## 2024-03-25 DIAGNOSIS — H1045 Other chronic allergic conjunctivitis: Secondary | ICD-10-CM | POA: Diagnosis not present

## 2024-03-25 LAB — HM DIABETES EYE EXAM

## 2024-03-28 ENCOUNTER — Other Ambulatory Visit: Payer: Self-pay | Admitting: Internal Medicine

## 2024-03-28 DIAGNOSIS — E118 Type 2 diabetes mellitus with unspecified complications: Secondary | ICD-10-CM

## 2024-03-31 NOTE — Telephone Encounter (Signed)
 Requested Prescriptions  Pending Prescriptions Disp Refills   ONETOUCH ULTRA test strip [Pharmacy Med Name: ONE TOUCH TES ULTRA] 100 strip 7    Sig: USE TO CHECK BLOOD SUGAR   TWO TIMES A DAY     Endocrinology: Diabetes - Testing Supplies Passed - 03/31/2024  1:48 PM      Passed - Valid encounter within last 12 months    Recent Outpatient Visits           1 month ago Essential hypertension   [facility], Leita DEL, MD   5 months ago Type II diabetes mellitus with complication [facility])   [facility], Leita DEL, MD       Future Appointments             In 2 months Justus, Leita DEL, MD [facility]

## 2024-04-16 ENCOUNTER — Ambulatory Visit
Admission: RE | Admit: 2024-04-16 | Discharge: 2024-04-16 | Disposition: A | Discharge status: Home / Self Care | Source: Ambulatory Visit | Attending: Pulmonary Disease | Admitting: Pulmonary Disease

## 2024-04-16 DIAGNOSIS — R053 Chronic cough: Secondary | ICD-10-CM | POA: Insufficient documentation

## 2024-04-16 DIAGNOSIS — R0602 Shortness of breath: Secondary | ICD-10-CM | POA: Insufficient documentation

## 2024-04-16 DIAGNOSIS — R918 Other nonspecific abnormal finding of lung field: Secondary | ICD-10-CM | POA: Diagnosis not present

## 2024-04-16 DIAGNOSIS — J849 Interstitial pulmonary disease, unspecified: Secondary | ICD-10-CM | POA: Insufficient documentation

## 2024-04-20 ENCOUNTER — Ambulatory Visit: Payer: Self-pay | Admitting: Pulmonary Disease

## 2024-04-21 NOTE — Progress Notes (Signed)
 Spoke with pt and relayed results. Pt expressed understanding and confirmed upcoming appointment.

## 2024-05-12 ENCOUNTER — Encounter: Payer: Self-pay | Admitting: Pulmonary Disease

## 2024-05-12 ENCOUNTER — Ambulatory Visit: Admitting: Pulmonary Disease

## 2024-05-12 VITALS — BP 140/72 | HR 98 | Temp 97.6°F | Ht 71.0 in | Wt 227.0 lb

## 2024-05-12 DIAGNOSIS — R053 Chronic cough: Secondary | ICD-10-CM

## 2024-05-12 DIAGNOSIS — J849 Interstitial pulmonary disease, unspecified: Secondary | ICD-10-CM | POA: Diagnosis not present

## 2024-05-12 DIAGNOSIS — R0602 Shortness of breath: Secondary | ICD-10-CM

## 2024-05-12 DIAGNOSIS — J8489 Other specified interstitial pulmonary diseases: Secondary | ICD-10-CM

## 2024-05-12 DIAGNOSIS — Z87891 Personal history of nicotine dependence: Secondary | ICD-10-CM | POA: Diagnosis not present

## 2024-05-12 LAB — PULMONARY FUNCTION TEST
FEF 25-75 Post: 1.31 L/s
FEF 25-75 Pre: 2.73 L/s
FEF2575-%Change-Post: -52 %
FEF2575-%Pred-Post: 55 %
FEF2575-%Pred-Pre: 115 %
FEV1-%Change-Post: -12 %
FEV1-%Pred-Post: 49 %
FEV1-%Pred-Pre: 56 %
FEV1-Post: 1.59 L
FEV1-Pre: 1.82 L
FEV1FVC-%Change-Post: -8 %
FEV1FVC-%Pred-Pre: 117 %
FEV6-%Change-Post: -7 %
FEV6-%Pred-Post: 47 %
FEV6-%Pred-Pre: 50 %
FEV6-Post: 1.97 L
FEV6-Pre: 2.13 L
FEV6FVC-%Pred-Post: 106 %
FEV6FVC-%Pred-Pre: 106 %
FVC-%Change-Post: -4 %
FVC-%Pred-Post: 45 %
FVC-%Pred-Pre: 47 %
FVC-Post: 2.03 L
FVC-Pre: 2.13 L
Post FEV1/FVC ratio: 78 %
Post FEV6/FVC ratio: 100 %
Pre FEV1/FVC ratio: 86 %
Pre FEV6/FVC Ratio: 100 %
RV % pred: 51 %
RV: 1.33 L
TLC % pred: 45 %
TLC: 3.27 L

## 2024-05-12 NOTE — Patient Instructions (Signed)
 We discussed today that you have a specific scarring in your lungs that may reflect issues with things like rheumatoid arthritis and lupus.  We are checking blood work for this.  We will notify you of the results of the tests as they come back.   Will see you in follow-up in 2 months time call sooner should any new problems arise.

## 2024-05-12 NOTE — Progress Notes (Signed)
 Full PFT completed today ? ?

## 2024-05-12 NOTE — Patient Instructions (Signed)
 Full PFT completed today ? ?

## 2024-05-12 NOTE — Progress Notes (Signed)
 Subjective:    Patient ID: Bruce Richardson, male    DOB: 1951-08-19, 73 y.o.   MRN: [id number]  Patient Care Team: Justus Leita DEL, MD as PCP - General (Internal Medicine) Mevelyn JONETTA Bathe, OD (Optometry) Celesta Reusing, NP as Nurse Practitioner (Neurosurgery) Maree Jannett POUR, MD as Consulting Physician (Neurology) Alana, Sharyle LABOR, RPH-CPP as Pharmacist  Chief Complaint  Patient presents with   Interstitial Lung Disease    Occasional cough, shortness of breath on exertion.     BACKGROUND/INTERVAL: Patient is a 73 year old former smoker who presents for follow-up on the issue of cough and shortness of breath.  Previously followed by Dr. Lavelle Servant.  Symptoms present for over a year.  First evaluated here on 05 March 2024, this is a follow-up visit.  No new symptoms in the interim.  HPI Discussed the use of AI scribe software for clinical note transcription with the patient, who gave verbal consent to proceed.  History of Present Illness   Bruce Richardson is a 73 year old male with interstitial lung disease who presents for evaluation of lung scarring.  He denies having rheumatoid arthritis or lupus, which are conditions that can cause similar scarring.  He experiences coughing when breathing deeply or with overexertion. He does not use inhalers until he overexerts himself, and finds that drinking water alleviates the cough more effectively than inhalers. He describes water as the best remedy, stating 'water does better than anything, any of it.'  No joint pains or swelling. He mentions that none of the conditions like arthritis or lupus run in his family.  He has not received a flu shot and is reluctant to take it, citing a family member's adverse reaction to the pneumonia and flu shots.    He had pulmonary function testing today that showed FEV1 of 1.82 L or 56% predicted, FVC of 2.13 L or 47% predicted, FEV1/FVC of 86%, no bronchodilator response.  Lung volumes are severely  reduced.  Diffusion capacity could not be obtained due to the patient unable to perform the maneuvers.  Findings are consistent with restrictive physiology.  No evidence of obstruction.   Review of Systems A 10 point review of systems was performed and it is as noted above otherwise negative.   Patient Active Problem List   Diagnosis Date Noted   Encephalopathy 11/30/2023   Primary insomnia 05/20/2023   Eczema 11/14/2021   BPPV (benign paroxysmal positional vertigo), unspecified laterality 12/24/2020   Chronic diastolic congestive heart failure (HCC) 11/29/2019   Centrilobular emphysema (HCC) 01/26/2019   Aortic atherosclerosis 01/26/2019   Echocardiogram shows left ventricular diastolic dysfunction 02/16/2018   Erectile dysfunction 02/16/2018   History of CVA (cerebrovascular accident) without residual deficits 08/13/2017   BPH without obstruction/lower urinary tract symptoms 03/19/2017   Chronic bilateral low back pain 11/14/2016   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   GERD (gastroesophageal reflux disease) 04/11/2016   Obesity 04/11/2016   Allergic rhinitis 08/04/2015   Essential hypertension 07/04/2015   Hyperlipidemia due to type 2 diabetes mellitus (HCC) 07/04/2015   Type II diabetes mellitus with complication (HCC) 07/04/2015    Social History   Tobacco Use   Smoking status: Former    Current packs/day: 0.00    Average packs/day: 1.5 packs/day for 25.0 years (37.6 ttl pk-yrs)    Types: Cigarettes    Start date: 12/19/2001    Quit date: 07/03/2002    Years since quitting: 21.9   Smokeless tobacco: Former  Tobacco comments:    quit smoking in 06/2001  Substance Use Topics   Alcohol use: No    Alcohol/week: 0.0 standard drinks of alcohol    No Known Allergies  Current Meds  Medication Sig   albuterol  (VENTOLIN  HFA) 108 (90 Base) MCG/ACT inhaler Inhale 2 puffs into the lungs every 4 (four) hours as needed for wheezing or shortness of  breath.   amLODipine  (NORVASC ) 5 MG tablet Take 1 tablet (5 mg total) by mouth daily.   aspirin  EC 81 MG tablet Take 1 tablet (81 mg total) by mouth daily.   clotrimazole -betamethasone  (LOTRISONE ) cream Apply 1 Application topically 2 (two) times daily. Apply twice a day to groin rash   empagliflozin  (JARDIANCE ) 25 MG TABS tablet Take 1 tablet (25 mg total) by mouth daily. Before breakfast   fluticasone  (FLONASE ) 50 MCG/ACT nasal spray Place 2 sprays into both nostrils daily.   Fluticasone -Umeclidin-Vilant (TRELEGY ELLIPTA ) 100-62.5-25 MCG/ACT AEPB Inhale 1 puff into the lungs daily.   glipiZIDE  (GLUCOTROL  XL) 2.5 MG 24 hr tablet Take 1 tablet (2.5 mg total) by mouth daily with breakfast.   lisinopril  (ZESTRIL ) 40 MG tablet Take 1 tablet (40 mg total) by mouth daily.   metFORMIN  (GLUCOPHAGE -XR) 500 MG 24 hr tablet Take 2 tablets (1,000 mg total) by mouth daily with breakfast.   ONETOUCH DELICA LANCETS 33G MISC 1 each by Does not apply route 2 (two) times daily. Dx: E11.9   ONETOUCH ULTRA test strip USE TO CHECK BLOOD SUGAR   TWO TIMES A DAY   pantoprazole  (PROTONIX ) 40 MG tablet Take 1 tablet (40 mg total) by mouth daily.   rosuvastatin  (CRESTOR ) 40 MG tablet Take 1 tablet (40 mg total) by mouth daily.   tamsulosin  (FLOMAX ) 0.4 MG CAPS capsule Take 1 capsule (0.4 mg total) by mouth daily after supper.    Immunization History  Administered Date(s) Administered   Moderna Sars-Covid-2 Vaccination 11/15/2019, 04/25/2020, 05/23/2020        Objective:     BP (!) 140/72   Pulse 98   Temp 97.6 F (36.4 C) (Temporal)   Ht 5' 11 (1.803 m)   Wt 227 lb (103 kg)   SpO2 95%   BMI 31.66 kg/m   SpO2: 95 %  GENERAL: Obese gentleman, no acute distress, fully ambulatory, no conversational dyspnea. HEAD: Normocephalic, atraumatic.  EYES: Pupils equal, round, reactive to light.  No scleral icterus.  MOUTH: Poor dentition, partial uppers.  Oral mucosa moist.  No thrush. NECK: Supple. No  thyromegaly. Trachea midline. No JVD.  No adenopathy. PULMONARY: Good air entry bilaterally.  Coarse, otherwise, no adventitious sounds. CARDIOVASCULAR: S1 and S2. Regular rate and rhythm with occasional extrasystoles.  No rubs, murmurs or gallops heard. ABDOMEN: Obese, otherwise benign. MUSCULOSKELETAL: No joint deformity, no clubbing, no edema.  NEUROLOGIC: No overt focal deficit, no gait disturbance, speech is fluent. SKIN: Intact,warm,dry. PSYCH: Taciturn and irascible.  Assessment & Plan:     ICD-10-CM   1. NSIP (nonspecific interstitial pneumonia) (HCC)  J84.89 Sedimentation rate    C-reactive protein    ANA Comprehensive Panel    Rheumatoid Factor    Anti-scleroderma antibody    Sjogrens syndrome-A extractable nuclear antibody    Sjogrens syndrome-B extractable nuclear antibody    Angiotensin converting enzyme    2. Chronic cough  R05.3     3. Shortness of breath  R06.02 Sedimentation rate    C-reactive protein    ANA Comprehensive Panel    Rheumatoid Factor  Anti-scleroderma antibody    Sjogrens syndrome-A extractable nuclear antibody    Sjogrens syndrome-B extractable nuclear antibody    Angiotensin converting enzyme      Orders Placed This Encounter  Procedures   Sedimentation rate    Standing Status:   Future    Expiration Date:   05/12/2025   C-reactive protein    Standing Status:   Future    Expiration Date:   05/12/2025   ANA Comprehensive Panel    Standing Status:   Future    Expiration Date:   05/12/2025   Rheumatoid Factor    Standing Status:   Future    Expiration Date:   05/12/2025   Anti-scleroderma antibody    Standing Status:   Future    Expiration Date:   05/12/2025   Sjogrens syndrome-A extractable nuclear antibody    Standing Status:   Future    Expiration Date:   05/12/2025   Sjogrens syndrome-B extractable nuclear antibody    Standing Status:   Future    Expiration Date:   05/12/2025   Angiotensin converting enzyme    Standing Status:    Future    Expiration Date:   05/12/2025   Discussion:    Interstitial lung disease with pulmonary fibrosis Confirmed pulmonary scarring, not of the more progressive type. Differential includes autoimmune conditions such as rheumatoid arthritis or lupus. No joint pain or swelling. Lungs clear on examination. - Order blood work for autoimmune conditions such as rheumatoid arthritis and lupus. - Refer to a specialist if blood work indicates autoimmune conditions.  Chronic cough Cough occurs with overexertion or deep breaths. Inhalers not regularly used; water intake alleviates symptoms. Likely due to dryness in the respiratory tract. - Advise increased water intake to manage cough symptoms.     Follow-up in 2 months time call sooner should any new problems arise.  Advised if symptoms do not improve or worsen, to please contact office for sooner follow up or seek emergency care.    I spent 30 minutes of dedicated to the care of this patient on the date of this encounter to include pre-visit review of records, face-to-face time with the patient discussing conditions above, post visit ordering of testing, clinical documentation with the electronic health record, making appropriate referrals as documented, and communicating necessary findings to members of the patients care team.     C. Leita Sanders, MD Advanced Bronchoscopy P[facility]    *This note was generated using voice recognition software/Dragon and/or AI transcription program.  Despite best efforts to proofread, errors can occur which can change the meaning. Any transcriptional errors that result from this process are unintentional and may not be fully corrected at the time of dictation.

## 2024-06-02 ENCOUNTER — Encounter: Payer: Self-pay | Admitting: Pulmonary Disease

## 2024-06-28 ENCOUNTER — Ambulatory Visit: Admitting: Internal Medicine

## 2024-07-02 DIAGNOSIS — L299 Pruritus, unspecified: Secondary | ICD-10-CM | POA: Diagnosis not present

## 2024-07-02 DIAGNOSIS — Z79899 Other long term (current) drug therapy: Secondary | ICD-10-CM | POA: Diagnosis not present

## 2024-07-02 DIAGNOSIS — R21 Rash and other nonspecific skin eruption: Secondary | ICD-10-CM | POA: Diagnosis not present

## 2024-07-02 DIAGNOSIS — B029 Zoster without complications: Secondary | ICD-10-CM | POA: Diagnosis not present

## 2024-07-02 DIAGNOSIS — J449 Chronic obstructive pulmonary disease, unspecified: Secondary | ICD-10-CM | POA: Diagnosis not present

## 2024-07-02 DIAGNOSIS — Z87891 Personal history of nicotine dependence: Secondary | ICD-10-CM | POA: Diagnosis not present

## 2024-07-21 ENCOUNTER — Ambulatory Visit: Payer: Self-pay

## 2024-07-21 NOTE — Telephone Encounter (Signed)
 FYI Only or Action Required?: Action required by provider: clinical question for provider.  Patient was last seen in primary care on 02/24/2024 by Justus Leita DEL, MD.  Called Nurse Triage reporting Herpes Zoster.  Symptoms began about a month ago.  Interventions attempted: Nothing.  Symptoms are: stable.  Triage Disposition: See PCP Within 2 Weeks  Patient/caregiver understands and will follow disposition?: No, wishes to speak with PCP  Patient/caller refused triage.  Reason for refusal: declined to provide reason. Reason for Disposition  Pain persisting > 1 month after rash disappears  Answer Assessment - Initial Assessment Questions Patient not wanting to answer questions, states he just needs a yes or no answer, is there anything he can do for the pain. He's just experiencing really bad pain, rash is visible and have crusted over. Advised coming in for an appointment to be assessed further, patient denied because he does not drive and has no one to bring him. Requesting call back [phone]  ** seen in ED in November, states he took all the medication.   1. APPEARANCE of RASH: What does the rash look like?      *No Answer* 2. LOCATION: Where is the rash located?      *No Answer* 3. ONSET: When did the rash start?      *No Answer* 4. ITCHING: Does the rash itch? If Yes, ask: How bad is the itch?  (Scale 1-10; or mild, moderate, severe)     *No Answer* 5. PAIN: Does the rash hurt? If Yes, ask: How bad is the pain?  (Scale 0-10; or none, mild, moderate, severe)     *No Answer* 6. OTHER SYMPTOMS: Do you have any other symptoms? (e.g., fever)     *No Answer* 7. PREGNANCY: Is there any chance you are pregnant? When was your last menstrual period?     *No Answer*  Protocols used: Shingles (Zoster)-A-AH  Copied from CRM [id number]. Topic: Clinical - Red Word Triage >> Jul 21, 2024 11:01 AM Suzen RAMAN wrote: Red Word that prompted transfer to Nurse Triage:  pain due to shingles; has already taken all pain medications

## 2024-07-22 ENCOUNTER — Other Ambulatory Visit: Payer: Self-pay | Admitting: Internal Medicine

## 2024-07-22 DIAGNOSIS — B0229 Other postherpetic nervous system involvement: Secondary | ICD-10-CM

## 2024-07-22 MED ORDER — GABAPENTIN 100 MG PO CAPS: 100.0000 mg | ORAL_CAPSULE | Freq: Three times a day (TID) | ORAL | 0 refills | Status: DC

## 2024-07-22 MED ORDER — GABAPENTIN 100 MG PO CAPS: 100.0000 mg | ORAL_CAPSULE | Freq: Three times a day (TID) | ORAL | 0 refills | Status: AC

## 2024-07-22 NOTE — Telephone Encounter (Signed)
 Spoke with patient he said he has taken all the pills.   Bruce Richardson

## 2024-07-22 NOTE — Progress Notes (Unsigned)
 Date:  07/22/2024   Name:  Bruce Richardson   DOB:  Mar 23, 1951   MRN:  [id number]   Chief Complaint: No chief complaint on file.  HPI  Review of Systems   Lab Results  Component Value Date   NA 138 11/30/2023   K 4.6 11/30/2023   CO2 23 11/30/2023   GLUCOSE 203 (H) 11/30/2023   BUN 19 11/30/2023   CREATININE 1.38 (H) 11/30/2023   CALCIUM  9.9 11/30/2023   EGFR 67 05/20/2023   GFRNONAA 54 (L) 11/30/2023   Lab Results  Component Value Date   CHOL 104 12/01/2023   HDL 30 (L) 12/01/2023   LDLCALC 55 12/01/2023   TRIG 94 12/01/2023   CHOLHDL 3.5 12/01/2023   Lab Results  Component Value Date   TSH 3.392 11/30/2023   Lab Results  Component Value Date   HGBA1C 6.7 (A) 02/24/2024   Lab Results  Component Value Date   WBC 10.2 11/30/2023   HGB 16.1 11/30/2023   HCT 48.3 11/30/2023   MCV 87.2 11/30/2023   PLT 265 11/30/2023   Lab Results  Component Value Date   ALT 12 11/30/2023   AST 16 11/30/2023   ALKPHOS 62 11/30/2023   BILITOT 1.3 (H) 11/30/2023   No results found for: MARIEN BOLLS, VD25OH   Patient Active Problem List   Diagnosis Date Noted   Encephalopathy 11/30/2023   Primary insomnia 05/20/2023   Eczema 11/14/2021   BPPV (benign paroxysmal positional vertigo), unspecified laterality 12/24/2020   Chronic diastolic congestive heart failure (HCC) 11/29/2019   Centrilobular emphysema (HCC) 01/26/2019   Aortic atherosclerosis 01/26/2019   Echocardiogram shows left ventricular diastolic dysfunction 02/16/2018   Erectile dysfunction 02/16/2018   History of CVA (cerebrovascular accident) without residual deficits 08/13/2017   BPH without obstruction/lower urinary tract symptoms 03/19/2017   Chronic bilateral low back pain 11/14/2016   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   GERD (gastroesophageal reflux disease) 04/11/2016   Obesity 04/11/2016   Allergic rhinitis 08/04/2015   Essential hypertension 07/04/2015    Hyperlipidemia due to type 2 diabetes mellitus (HCC) 07/04/2015   Type II diabetes mellitus with complication (HCC) 07/04/2015    No Known Allergies  Past Surgical History:  Procedure Laterality Date   CIRCUMCISION N/A 07/24/2015   Procedure: CIRCUMCISION ADULT;  Surgeon: Redell Lynwood Napoleon, MD;  Location: [facility] ORS;  Service: Urology;  Laterality: N/A;   EYE SURGERY Right    right eye cataract surgery    SHOULDER ARTHROSCOPY Bilateral    SHOULDER ARTHROSCOPY Right 12/22/2014   Procedure: RIGHT SHOULDER ARTHROSCOPY /DECOMPRESSION/ROTATOR CUFF REPAIR OF RECURRENT ROTATOR CUFF TEAR;  Surgeon: Norleen JINNY Maltos, MD;  Location: [facility] ORS;  Service: Orthopedics;  Laterality: Right;    Social History   Tobacco Use   Smoking status: Former    Current packs/day: 0.00    Average packs/day: 1.5 packs/day for 25.0 years (37.6 ttl pk-yrs)    Types: Cigarettes    Start date: 12/19/2001    Quit date: 07/03/2002    Years since quitting: 22.0   Smokeless tobacco: Former   Tobacco comments:    quit smoking in 06/2001  Vaping Use   Vaping status: Never Used  Substance Use Topics   Alcohol use: No    Alcohol/week: 0.0 standard drinks of alcohol   Drug use: No     Medication list has been reviewed and updated.  No outpatient medications have been marked as taking for the 07/22/24 encounter (Orders Only)  with Justus Leita DEL, MD.       02/24/2024   11:16 AM 10/24/2023    9:51 AM 03/11/2023   10:24 AM 05/10/2022    8:17 AM  GAD 7 : Generalized Anxiety Score  Nervous, Anxious, on Edge 0 1 0 1  Control/stop worrying 0 0 0 0  Worry too much - different things 0 1 0 0  Trouble relaxing 0 3 0 1  Restless 0 2 0 1  Easily annoyed or irritable 0 2 0 0  Afraid - awful might happen 0 0 0 0  Total GAD 7 Score 0 9 0 3  Anxiety Difficulty Not difficult at all Somewhat difficult Not difficult at all Not difficult at all       02/24/2024   11:16 AM 11/05/2023    8:19 AM 10/24/2023    9:50 AM  Depression  screen PHQ 2/9  Decreased Interest 1 2 1   Down, Depressed, Hopeless 1 3 0  PHQ - 2 Score 2 5 1   Altered sleeping 0 0 3  Tired, decreased energy 0 1 2  Change in appetite 0 0 1  Feeling bad or failure about yourself  0 0 1  Trouble concentrating 0 0 1  Moving slowly or fidgety/restless 0 0 1  Suicidal thoughts 0 0 0  PHQ-9 Score 2  6  10    Difficult doing work/chores Not difficult at all Not difficult at all Somewhat difficult     Data saved with a previous flowsheet row definition    BP Readings from Last 3 Encounters:  05/12/24 (!) 140/72  03/05/24 128/62  02/24/24 122/72    Physical Exam  Wt Readings from Last 3 Encounters:  05/12/24 227 lb (103 kg)  05/12/24 227 lb 9.6 oz (103.2 kg)  03/05/24 225 lb 3.2 oz (102.2 kg)    There were no vitals taken for this visit.  Assessment and Plan:  Problem List Items Addressed This Visit   None   No follow-ups on file.    Leita HILARIO Justus, MD [facility] [place]

## 2024-07-22 NOTE — Telephone Encounter (Signed)
 Can you resend through [employer] on file. Thank you.   JM

## 2024-07-22 NOTE — Telephone Encounter (Signed)
 Please review and advise.  JM

## 2024-07-22 NOTE — Telephone Encounter (Signed)
 Spoke with patient and informed him of what you said he was not happy as he does not have a ride to get to the office.   JM

## 2024-07-22 NOTE — Telephone Encounter (Signed)
 Spoke with patient and informed him.   JM

## 2024-07-26 ENCOUNTER — Ambulatory Visit: Admitting: Pulmonary Disease

## 2024-08-02 ENCOUNTER — Other Ambulatory Visit: Payer: Self-pay | Admitting: Internal Medicine

## 2024-08-02 DIAGNOSIS — J432 Centrilobular emphysema: Secondary | ICD-10-CM

## 2024-08-02 DIAGNOSIS — B356 Tinea cruris: Secondary | ICD-10-CM

## 2024-08-02 DIAGNOSIS — J3089 Other allergic rhinitis: Secondary | ICD-10-CM

## 2024-08-02 DIAGNOSIS — E118 Type 2 diabetes mellitus with unspecified complications: Secondary | ICD-10-CM

## 2024-08-05 NOTE — Telephone Encounter (Signed)
 Requested medication (s) are due for refill today: Yes  Requested medication (s) are on the active medication list: Yes  Last refill:  Lotrisone   10/24/23  Future visit scheduled: Yes  Notes to clinic:  Lotrisone  - manual review  Metformin  100 mg D/C   Breztri    D/C.    Requested Prescriptions  Pending Prescriptions Disp Refills   clotrimazole -betamethasone  (LOTRISONE ) cream [Pharmacy Med Name: CLOTRIM/BETA CRE 1%-0.05%] 45 g 1    Sig: APPLY 1 APPLICATION        TOPICALLY TWO TIMES A DAY  TO GROIN RASH     Off-Protocol Failed - 08/05/2024 10:30 AM      Failed - Medication not assigned to a protocol, review manually.      Passed - Valid encounter within last 12 months    Recent Outpatient Visits           5 months ago Essential hypertension   [place] [facility] at [facility], Leita DEL, MD   9 months ago Type II diabetes mellitus with complication [facility])   [place] [facility] at [facility], Leita DEL, MD               BREZTRI  AEROSPHERE 160-9-4.8 MCG/ACT AERO inhaler [Pharmacy Med Name: BREZTRI  AERO AER SPHERE] 32.1 g 0    Sig: USE 2 INHALATIONS ORALLY   TWICE DAILY     Off-Protocol Failed - 08/05/2024 10:30 AM      Failed - Medication not assigned to a protocol, review manually.      Passed - Valid encounter within last 12 months    Recent Outpatient Visits           5 months ago Essential hypertension   [place] [facility] at [facility], Leita DEL, MD   9 months ago Type II diabetes mellitus with complication [facility])   [place] [facility] at [facility], Leita DEL, MD               metFORMIN  (GLUCOPHAGE ) 1000 MG tablet [Pharmacy Med Name: METFORMIN  TAB 1000MG ] 180 tablet 1    Sig: TAKE 1 TABLET TWICE DAILY  WITH MEALS     Endocrinology:  Diabetes - Biguanides Failed - 08/05/2024 10:30 AM      Failed - Cr in normal  range and within 360 days    Creat  Date Value Ref Range Status  02/10/2018 1.04 0.70 - 1.25 mg/dL Final    Comment:    For patients >73 years of age, the reference limit for Creatinine is approximately 13% higher for people identified as African-American. .    Creatinine, Ser  Date Value Ref Range Status  11/30/2023 1.38 (H) 0.61 - 1.24 mg/dL Final         Failed - eGFR in normal range and within 360 days    GFR, Est African American  Date Value Ref Range Status  02/10/2018 86 > OR = 60 mL/min/1.109m2 Final   GFR calc Af Amer  Date Value Ref Range Status  07/12/2020 71 >59 mL/min/1.73 Final    Comment:    **In accordance with recommendations from the NKF-ASN Task force,**   [facility] is in the process of updating its eGFR calculation to the   2021 CKD-EPI creatinine equation that estimates kidney function   without a race variable.    GFR, Est Non African American  Date Value Ref Range  Status  02/10/2018 74 > OR = 60 mL/min/1.107m2 Final   GFR, Estimated  Date Value Ref Range Status  11/30/2023 54 (L) >60 mL/min Final    Comment:    (NOTE) Calculated using the CKD-EPI Creatinine Equation (2021)    eGFR  Date Value Ref Range Status  05/20/2023 67 >59 mL/min/1.73 Final         Failed - B12 Level in normal range and within 720 days    No results found for: VITAMINB12       Passed - HBA1C is between 0 and 7.9 and within 180 days    Hemoglobin A1C  Date Value Ref Range Status  02/24/2024 6.7 (A) 4.0 - 5.6 % Final   Hgb A1c MFr Bld  Date Value Ref Range Status  05/20/2023 9.1 (H) 4.8 - 5.6 % Final    Comment:             Prediabetes: 5.7 - 6.4          Diabetes: >6.4          Glycemic control for adults with diabetes: <7.0          Passed - Valid encounter within last 6 months    Recent Outpatient Visits           5 months ago Essential hypertension   [place] [facility] at [facility], Leita DEL, MD   9 months ago  Type II diabetes mellitus with complication [facility])   [place] [facility] at [facility], Leita DEL, MD              Passed - CBC within normal limits and completed in the last 12 months    WBC  Date Value Ref Range Status  11/30/2023 10.2 4.0 - 10.5 K/uL Final   RBC  Date Value Ref Range Status  11/30/2023 5.54 4.22 - 5.81 MIL/uL Final   Hemoglobin  Date Value Ref Range Status  11/30/2023 16.1 13.0 - 17.0 g/dL Final  89/98/7975 85.2 13.0 - 17.7 g/dL Final   HCT  Date Value Ref Range Status  11/30/2023 48.3 39.0 - 52.0 % Final   Hematocrit  Date Value Ref Range Status  05/20/2023 46.1 37.5 - 51.0 % Final   MCHC  Date Value Ref Range Status  11/30/2023 33.3 30.0 - 36.0 g/dL Final   [facility]  Date Value Ref Range Status  11/30/2023 29.1 26.0 - 34.0 pg Final   MCV  Date Value Ref Range Status  11/30/2023 87.2 80.0 - 100.0 fL Final  05/20/2023 90 79 - 97 fL Final  12/29/2012 85 80 - 100 fL Final   No results found for: PLTCOUNTKUC, LABPLAT, POCPLA RDW  Date Value Ref Range Status  11/30/2023 13.2 11.5 - 15.5 % Final  05/20/2023 12.6 11.6 - 15.4 % Final  12/29/2012 12.6 11.5 - 14.5 % Final         Signed Prescriptions Disp Refills   fluticasone  (FLONASE ) 50 MCG/ACT nasal spray 32 g 1    Sig: USE 2 SPRAYS IN EACH       NOSTRIL DAILY     Ear, Nose, and Throat: Nasal Preparations - Corticosteroids Passed - 08/05/2024 10:30 AM      Passed - Valid encounter within last 12 months    Recent Outpatient Visits           5 months ago Essential hypertension   [place] [facility] at [facility], Leita DEL, MD   9 months ago Type II diabetes mellitus with complication [facility])   [place] [facility] at [facility], Leita DEL, MD               albuterol  (VENTOLIN  HFA) 108 [phone] Base) MCG/ACT inhaler 54 g 0    Sig: USE 2 INHALATIONS ORALLY   EVERY 4 HOURS AS NEEDED  FORWHEEZING OR SHORTNESS OF   BREATH     Pulmonology:  Beta Agonists 2 Failed - 08/05/2024 10:30 AM      Failed - Last BP in normal range    BP Readings from Last 1 Encounters:  05/12/24 (!) 140/72         Passed - Last Heart Rate in normal range    Pulse Readings from Last 1 Encounters:  05/12/24 98         Passed - Valid encounter within last 12 months    Recent Outpatient Visits           5 months ago Essential hypertension   [place] [facility] at [facility], Leita DEL, MD   9 months ago Type II diabetes mellitus with complication [facility])   [place] [facility] at [facility], Leita DEL, MD              pcm

## 2024-08-05 NOTE — Telephone Encounter (Signed)
 Requested Prescriptions  Pending Prescriptions Disp Refills   clotrimazole -betamethasone  (LOTRISONE ) cream [Pharmacy Med Name: CLOTRIM/BETA CRE 1%-0.05%] 45 g 1    Sig: APPLY 1 APPLICATION        TOPICALLY TWO TIMES A DAY  TO GROIN RASH     Off-Protocol Failed - 08/05/2024 10:27 AM      Failed - Medication not assigned to a protocol, review manually.      Passed - Valid encounter within last 12 months    Recent Outpatient Visits           5 months ago Essential hypertension   [place] [facility], Leita DEL, MD   9 months ago Type II diabetes mellitus with complication [facility])   [place] [facility] at [facility], Leita DEL, MD               fluticasone  (FLONASE ) 50 MCG/ACT nasal spray [Pharmacy Med Name: FLUTICASONE   SPR RX] 32 g 1    Sig: USE 2 SPRAYS IN EACH       NOSTRIL DAILY     Ear, Nose, and Throat: Nasal Preparations - Corticosteroids Passed - 08/05/2024 10:27 AM      Passed - Valid encounter within last 12 months    Recent Outpatient Visits           5 months ago Essential hypertension   [place] [facility] at [facility], Leita DEL, MD   9 months ago Type II diabetes mellitus with complication [facility])   [place] [facility] at [facility], Leita DEL, MD               BREZTRI  AEROSPHERE 160-9-4.8 MCG/ACT AERO inhaler [Pharmacy Med Name: BREZTRI  AERO AER SPHERE] 32.1 g 0    Sig: USE 2 INHALATIONS ORALLY   TWICE DAILY     Off-Protocol Failed - 08/05/2024 10:27 AM      Failed - Medication not assigned to a protocol, review manually.      Passed - Valid encounter within last 12 months    Recent Outpatient Visits           5 months ago Essential hypertension   [place] [facility] at [facility], Leita DEL, MD   9 months ago Type II diabetes mellitus with complication  [facility])   [place] [facility] at [facility], Leita DEL, MD               albuterol  (VENTOLIN  HFA) 108 [phone] Base) MCG/ACT inhaler [Pharmacy Med Name: ALBUTEROL  AER HFA (V)] 54 g 0    Sig: USE 2 INHALATIONS ORALLY   EVERY 4 HOURS AS NEEDED FORWHEEZING OR SHORTNESS OF   BREATH     Pulmonology:  Beta Agonists 2 Failed - 08/05/2024 10:27 AM      Failed - Last BP in normal range    BP Readings from Last 1 Encounters:  05/12/24 (!) 140/72         Passed - Last Heart Rate in normal range    Pulse Readings from Last 1 Encounters:  05/12/24 98         Passed - Valid encounter within last 12 months    Recent Outpatient Visits           5 months ago Essential hypertension   [place] [facility] at [facility], Leita DEL, MD   9 months ago Type II diabetes mellitus with complication [facility])   [place] [facility], Leita DEL, MD               metFORMIN  (GLUCOPHAGE ) 1000 MG tablet [Pharmacy Med Name: METFORMIN  TAB 1000MG ] 180 tablet 1    Sig: TAKE 1 TABLET TWICE DAILY  WITH MEALS     Endocrinology:  Diabetes - Biguanides Failed - 08/05/2024 10:27 AM      Failed - Cr in normal range and within 360 days    Creat  Date Value Ref Range Status  02/10/2018 1.04 0.70 - 1.25 mg/dL Final    Comment:    For patients >57 years of age, the reference limit for Creatinine is approximately 13% higher for people identified as African-American. .    Creatinine, Ser  Date Value Ref Range Status  11/30/2023 1.38 (H) 0.61 - 1.24 mg/dL Final         Failed - eGFR in normal range and within 360 days    GFR, Est African American  Date Value Ref Range Status  02/10/2018 86 > OR = 60 mL/min/1.16m2 Final   GFR calc Af Amer  Date Value Ref Range Status  07/12/2020 71 >59 mL/min/1.73 Final    Comment:    **In accordance with recommendations from the NKF-ASN Task force,**    [facility] is in the process of updating its eGFR calculation to the   2021 CKD-EPI creatinine equation that estimates kidney function   without a race variable.    GFR, Est Non African American  Date Value Ref Range Status  02/10/2018 74 > OR = 60 mL/min/1.65m2 Final   GFR, Estimated  Date Value Ref Range Status  11/30/2023 54 (L) >60 mL/min Final    Comment:    (NOTE) Calculated using the CKD-EPI Creatinine Equation (2021)    eGFR  Date Value Ref Range Status  05/20/2023 67 >59 mL/min/1.73 Final         Failed - B12 Level in normal range and within 720 days    No results found for: VITAMINB12       Passed - HBA1C is between 0 and 7.9 and within 180 days    Hemoglobin A1C  Date Value Ref Range Status  02/24/2024 6.7 (A) 4.0 - 5.6 % Final   Hgb A1c MFr Bld  Date Value Ref Range Status  05/20/2023 9.1 (H) 4.8 - 5.6 % Final    Comment:             Prediabetes: 5.7 - 6.4          Diabetes: >6.4          Glycemic control for adults with diabetes: <7.0          Passed - Valid encounter within last 6 months    Recent Outpatient Visits           5 months ago Essential hypertension   [place] [facility] at [facility], Leita DEL, MD   9 months ago Type II diabetes mellitus with complication [facility])   [place] [facility] at [facility], Leita DEL, MD              Passed - CBC within normal limits and completed in the last 12 months    WBC  Date Value Ref Range Status  11/30/2023 10.2 4.0 - 10.5 K/uL Final   RBC  Date Value Ref Range Status  11/30/2023 5.54 4.22 - 5.81 MIL/uL Final   Hemoglobin  Date Value Ref Range Status  11/30/2023 16.1 13.0 - 17.0 g/dL Final  89/98/7975 85.2 13.0 - 17.7 g/dL Final   HCT  Date Value Ref Range Status  11/30/2023 48.3 39.0 - 52.0 % Final   Hematocrit  Date Value Ref Range Status  05/20/2023 46.1 37.5 - 51.0 % Final   MCHC  Date Value Ref Range Status   11/30/2023 33.3 30.0 - 36.0 g/dL Final   [facility]  Date Value Ref Range Status  11/30/2023 29.1 26.0 - 34.0 pg Final   MCV  Date Value Ref Range Status  11/30/2023 87.2 80.0 - 100.0 fL Final  05/20/2023 90 79 - 97 fL Final  12/29/2012 85 80 - 100 fL Final   No results found for: PLTCOUNTKUC, LABPLAT, POCPLA RDW  Date Value Ref Range Status  11/30/2023 13.2 11.5 - 15.5 % Final  05/20/2023 12.6 11.6 - 15.4 % Final  12/29/2012 12.6 11.5 - 14.5 % Final

## 2024-08-25 ENCOUNTER — Ambulatory Visit: Payer: Self-pay | Admitting: *Deleted

## 2024-08-25 NOTE — Telephone Encounter (Signed)
 FYI Only or Action Required?: FYI only for provider: appointment scheduled on 1/8.  Patient was last seen in primary care on 02/24/2024 by Justus Leita DEL, MD.  Called Nurse Triage reporting Pain (Pain).  Symptoms began about a month ago.  Interventions attempted: Prescription medications: gabapentin  .  Symptoms are: gradually worsening.  Triage Disposition: See Physician Within 24 Hours  Patient/caregiver understands and will follow disposition?: Yes  Copied from CRM #[id number]. Topic: Clinical - Red Word Triage >> Aug 25, 2024 11:03 AM Tinnie BROCKS wrote: Red Word that prompted transfer to Nurse Triage: Shingles flare-up with severe pain he says is from the inside out Reason for Disposition  Rash in same area as pain (may be described as small blisters)    Hx shingles- treated at ED  Answer Assessment - Initial Assessment Questions 1. ONSET: When did the pain begin? (e.g., minutes, hours, days)     Mid Novemebr- with initial shingles outbreak 2. LOCATION: Where does it hurt? (upper, mid or lower back)     Back and stomach 3. SEVERITY: How bad is the pain?  (e.g., Scale 1-10; mild, moderate, or severe)     Mild- severe 4. PATTERN: Is the pain constant? (e.g., yes, no; constant, intermittent)      Constant- :hot poker, burning sensation 5. RADIATION: Does the pain shoot into your legs or somewhere else?     no 6. CAUSE:  What do you think is causing the back pain?      Post shingles nerve pain- inside and out 7. BACK OVERUSE:  Any recent lifting of heavy objects, strenuous work or exercise?     no 8. MEDICINES: What have you taken so far for the pain? (e.g., nothing, acetaminophen , NSAIDS)     Gabapentin  100mg - 3 times day- not helping  10. OTHER SYMPTOMS: Do you have any other symptoms? (e.g., fever, abdomen pain, burning with urination, blood in urine)       no  Protocols used: Back Pain-A-AH

## 2024-08-26 ENCOUNTER — Ambulatory Visit (INDEPENDENT_AMBULATORY_CARE_PROVIDER_SITE_OTHER): Admitting: Student

## 2024-08-26 ENCOUNTER — Encounter: Payer: Self-pay | Admitting: Student

## 2024-08-26 ENCOUNTER — Telehealth: Payer: Self-pay

## 2024-08-26 VITALS — BP 122/76 | HR 89 | Temp 98.4°F | Ht 71.0 in | Wt 226.0 lb

## 2024-08-26 DIAGNOSIS — B0229 Other postherpetic nervous system involvement: Secondary | ICD-10-CM

## 2024-08-26 MED ORDER — CAPSAICIN 0.075 % EX CREA: 1.0000 | TOPICAL_CREAM | Freq: Four times a day (QID) | CUTANEOUS | 0 refills | Status: DC

## 2024-08-26 MED ORDER — AMITRIPTYLINE HCL 10 MG PO TABS: 10.0000 mg | ORAL_TABLET | Freq: Every day | ORAL | 0 refills | Status: AC

## 2024-08-26 NOTE — Progress Notes (Signed)
 "  Established Patient Office Visit  Subjective   Patient ID: Bruce Richardson, male    DOB: 1951/06/09  Age: 74 y.o. MRN: [id number]  Chief Complaint  Patient presents with   Herpes Zoster    Abdominal pain, states the pain his hurting from the inside out, had shingles in November, having pain the area where the outbreak occurred     Bruce Richardson is a 74 y.o. person with medical hx listed below who presents today for burning in the abdomen since November. Describes pain as hot pokers. Pain is worse at night when laying still. Pain worsens with light touch. Is taking gabapentin  without improvement. No new rash. Would like something topical for pain.   Patient Active Problem List   Diagnosis Date Noted   Encephalopathy 11/30/2023   Primary insomnia 05/20/2023   Eczema 11/14/2021   BPPV (benign paroxysmal positional vertigo), unspecified laterality 12/24/2020   Chronic diastolic congestive heart failure (HCC) 11/29/2019   Centrilobular emphysema (HCC) 01/26/2019   Aortic atherosclerosis 01/26/2019   Echocardiogram shows left ventricular diastolic dysfunction 02/16/2018   Erectile dysfunction 02/16/2018   History of CVA (cerebrovascular accident) without residual deficits 08/13/2017   BPH without obstruction/lower urinary tract symptoms 03/19/2017   Chronic bilateral low back pain 11/14/2016   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   GERD (gastroesophageal reflux disease) 04/11/2016   Obesity 04/11/2016   Allergic rhinitis 08/04/2015   Essential hypertension 07/04/2015   Hyperlipidemia due to type 2 diabetes mellitus (HCC) 07/04/2015   Type II diabetes mellitus with complication (HCC) 07/04/2015      ROS Refer to HPI    Objective:     Outpatient Encounter Medications as of 08/26/2024  Medication Sig   albuterol  (VENTOLIN  HFA) 108 (90 Base) MCG/ACT inhaler USE 2 INHALATIONS ORALLY   EVERY 4 HOURS AS NEEDED FORWHEEZING OR SHORTNESS OF   BREATH    amitriptyline  (ELAVIL ) 10 MG tablet Take 1 tablet (10 mg total) by mouth at bedtime.   amLODipine  (NORVASC ) 5 MG tablet Take 1 tablet (5 mg total) by mouth daily.   aspirin  EC 81 MG tablet Take 1 tablet (81 mg total) by mouth daily.   capsicum (ZOSTRIX) 0.075 % topical cream Apply 1 Application topically 4 (four) times daily.   clotrimazole -betamethasone  (LOTRISONE ) cream Apply 1 Application topically 2 (two) times daily. Apply twice a day to groin rash   empagliflozin  (JARDIANCE ) 25 MG TABS tablet Take 1 tablet (25 mg total) by mouth daily. Before breakfast   fluticasone  (FLONASE ) 50 MCG/ACT nasal spray USE 2 SPRAYS IN EACH       NOSTRIL DAILY   Fluticasone -Umeclidin-Vilant (TRELEGY ELLIPTA ) 100-62.5-25 MCG/ACT AEPB Inhale 1 puff into the lungs daily.   gabapentin  (NEURONTIN ) 100 MG capsule Take 1 capsule (100 mg total) by mouth 3 (three) times daily.   glipiZIDE  (GLUCOTROL  XL) 2.5 MG 24 hr tablet Take 1 tablet (2.5 mg total) by mouth daily with breakfast.   lisinopril  (ZESTRIL ) 40 MG tablet Take 1 tablet (40 mg total) by mouth daily.   metFORMIN  (GLUCOPHAGE -XR) 500 MG 24 hr tablet Take 2 tablets (1,000 mg total) by mouth daily with breakfast.   ONETOUCH DELICA LANCETS 33G MISC 1 each by Does not apply route 2 (two) times daily. Dx: E11.9   ONETOUCH ULTRA test strip USE TO CHECK BLOOD SUGAR   TWO TIMES A DAY   pantoprazole  (PROTONIX ) 40 MG tablet Take 1 tablet (40 mg total) by mouth daily.   rosuvastatin  (CRESTOR ) 40  MG tablet Take 1 tablet (40 mg total) by mouth daily.   tamsulosin  (FLOMAX ) 0.4 MG CAPS capsule Take 1 capsule (0.4 mg total) by mouth daily after supper.   No facility-administered encounter medications on file as of 08/26/2024.    BP 122/76   Pulse 89   Temp 98.4 F (36.9 C) (Oral)   Ht 5' 11 (1.803 m)   Wt 226 lb (102.5 kg)   SpO2 93%   BMI 31.52 kg/m  BP Readings from Last 3 Encounters:  08/26/24 122/76  05/12/24 (!) 140/72  03/05/24 128/62    Physical  Exam Constitutional:      Appearance: Bruce appearance.  HENT:     Head: Normocephalic and atraumatic.  Eyes:     Extraocular Movements: Extraocular movements intact.     Conjunctiva/sclera: Conjunctivae Bruce.     Pupils: Pupils are equal, round, and reactive to light.  Pulmonary:     Effort: Pulmonary effort is Bruce.  Abdominal:     General: Abdomen is flat. Bowel sounds are Bruce.     Palpations: Abdomen is soft.  Skin:    Capillary Refill: Capillary refill takes less than 2 seconds.     Comments: Hyperpigmentation and scarring of the left abdomen and back, tenderness to light touch.  Neurological:     General: No focal deficit present.     Mental Status: He is alert and oriented to person, place, and time.  Psychiatric:        Mood and Affect: Mood Bruce.        Behavior: Behavior Bruce.        08/26/2024    9:40 AM 02/24/2024   11:16 AM 11/05/2023    8:19 AM  Depression screen PHQ 2/9  Decreased Interest 0 1 2  Down, Depressed, Hopeless 0 1 3  PHQ - 2 Score 0 2 5  Altered sleeping  0 0  Tired, decreased energy  0 1  Change in appetite  0 0  Feeling bad or failure about yourself   0 0  Trouble concentrating  0 0  Moving slowly or fidgety/restless  0 0  Suicidal thoughts  0 0  PHQ-9 Score  2  6   Difficult doing work/chores  Not difficult at all Not difficult at all     Data saved with a previous flowsheet row definition       08/26/2024    9:40 AM 02/24/2024   11:16 AM 10/24/2023    9:51 AM 03/11/2023   10:24 AM  GAD 7 : Generalized Anxiety Score  Nervous, Anxious, on Edge 0 0 1 0  Control/stop worrying 0 0 0 0  Worry too much - different things  0 1 0  Trouble relaxing  0 3 0  Restless  0 2 0  Easily annoyed or irritable  0 2 0  Afraid - awful might happen  0 0 0  Total GAD 7 Score  0 9 0  Anxiety Difficulty  Not difficult at all Somewhat difficult Not difficult at all    No results found for any visits on 08/26/24.    The ASCVD Risk score (Arnett  DK, et al., 2019) failed to calculate for the following reasons:   Risk score cannot be calculated because patient has a medical history suggesting prior/existing ASCVD   * - Cholesterol units were assumed    Assessment & Plan:  Post herpetic neuralgia Continue to have constant burning from shingles in November. Is currently on gabapentin  100  mg three times a day but does not feel this is effective. Stop gabapentin . Start amitriptyline  10 mg daily at bedtime and Capsaicin  cream as needed. Other orders -     Amitriptyline  HCl; Take 1 tablet (10 mg total) by mouth at bedtime.  Dispense: 30 tablet; Refill: 0 -     Capsaicin ; Apply 1 Application topically 4 (four) times daily.  Dispense: 28.3 g; Refill: 0     No follow-ups on file.    Harlene Saddler, MD "

## 2024-08-27 ENCOUNTER — Telehealth: Payer: Self-pay | Admitting: Pharmacy Technician

## 2024-08-27 ENCOUNTER — Other Ambulatory Visit (HOSPITAL_COMMUNITY): Payer: Self-pay

## 2024-08-27 NOTE — Telephone Encounter (Signed)
 Pharmacy Patient Advocate Encounter   Received notification from Pt Calls Messages that prior authorization for Amitriptyline  HCl 10MG  tablets is required/requested.   Insurance verification completed.   The patient is insured through [facility].   Per test claim: PA required; PA started via CoverMyMeds. KEY [id number] . Waiting for clinical questions to populate.

## 2024-08-27 NOTE — Telephone Encounter (Signed)
 PA request has been Started. New Encounter has been or will be created for follow up. For additional info see Pharmacy Prior Auth telephone encounter from 08/27/24.

## 2024-08-27 NOTE — Telephone Encounter (Signed)
 Pharmacy Patient Advocate Encounter  Received notification from [facility] MEDICARE that Prior Authorization for Amitriptyline  HCl 10MG  tablets has been APPROVED from 08/27/24 to 08/18/25   PA #/Case ID/Reference #: [id number]

## 2024-09-01 ENCOUNTER — Other Ambulatory Visit: Payer: Self-pay | Admitting: Student

## 2024-09-02 NOTE — Telephone Encounter (Signed)
 Duplicate request.  Requested Prescriptions  Pending Prescriptions Disp Refills   amitriptyline  (ELAVIL ) 10 MG tablet [Pharmacy Med Name: AMITRIPTYLIN TAB 10MG ] 30 tablet 0    Sig: TAKE 1 TABLET AT BEDTIME     Psychiatry:  Antidepressants - Heterocyclics (TCAs) Failed - 09/02/2024  4:07 PM      Failed - Valid encounter within last 6 months    Recent Outpatient Visits           1 week ago Post herpetic neuralgia   [place] [facility] at [facility], MD   6 months ago Essential hypertension   [place] [facility] at [facility], Leita DEL, MD   10 months ago Type II diabetes mellitus with complication [facility])   [place] [facility] at [facility], Leita DEL, MD

## 2024-09-03 ENCOUNTER — Other Ambulatory Visit: Payer: Self-pay

## 2024-09-03 DIAGNOSIS — E118 Type 2 diabetes mellitus with unspecified complications: Secondary | ICD-10-CM

## 2024-09-03 MED ORDER — ONETOUCH ULTRA VI STRP: ORAL_STRIP | 7 refills | Status: DC

## 2024-09-04 ENCOUNTER — Other Ambulatory Visit: Payer: Self-pay | Admitting: Family Medicine

## 2024-09-04 DIAGNOSIS — E118 Type 2 diabetes mellitus with unspecified complications: Secondary | ICD-10-CM

## 2024-09-06 NOTE — Telephone Encounter (Signed)
 Please complete PA.    KP

## 2024-09-06 NOTE — Telephone Encounter (Signed)
 Requested medication (s) are due for refill today - no  Requested medication (s) are on the active medication list -yes  Future visit scheduled -yes  Last refill: 09/03/24  Notes to clinic:   Pharmacy comment: This medication requires a Prior Authorization. Please consider Accu-Chek or True Metrix as an alternative or obtain a Prior Authorization. For Prior Authorization, please call [phone]. This medication requires a Prior Authorization. Please consider Accu-Chek or True Metrix as an alternative or obtain a Prior Authorization. For Prior Authorization, please call [phone].    Requested Prescriptions  Pending Prescriptions Disp Refills   ONETOUCH ULTRA test strip [Pharmacy Med Name: ONE TOUCH TES ULTRA]  7    Sig: USE AND DISCARD 1 TEST     STRIP AS INSTRUCTED.     Endocrinology: Diabetes - Testing Supplies Passed - 09/06/2024  1:41 PM      Passed - Valid encounter within last 12 months    Recent Outpatient Visits           1 week ago Post herpetic neuralgia   [place] [facility], MD   6 months ago Essential hypertension   [place] [facility] at [facility], Leita DEL, MD   10 months ago Type II diabetes mellitus with complication [facility])   [place] [facility] at [facility], Leita DEL, MD                 Requested Prescriptions  Pending Prescriptions Disp Refills   ONETOUCH ULTRA test strip [Pharmacy Med Name: ONE TOUCH TES ULTRA]  7    Sig: USE AND DISCARD 1 TEST     STRIP AS INSTRUCTED.     Endocrinology: Diabetes - Testing Supplies Passed - 09/06/2024  1:41 PM      Passed - Valid encounter within last 12 months    Recent Outpatient Visits           1 week ago Post herpetic neuralgia   [place] [facility] at [facility], MD   6 months ago Essential hypertension   [place] [facility] at [facility], Leita DEL, MD   10 months ago Type II diabetes mellitus with complication [facility])   [place] [facility] at [facility], Leita DEL, MD

## 2024-09-07 ENCOUNTER — Other Ambulatory Visit (HOSPITAL_COMMUNITY): Payer: Self-pay

## 2024-09-07 ENCOUNTER — Telehealth: Payer: Self-pay | Admitting: Pharmacy Technician

## 2024-09-07 MED ORDER — ACCU-CHEK GUIDE TEST VI STRP: ORAL_STRIP | 12 refills | Status: AC

## 2024-09-07 MED ORDER — ACCU-CHEK SOFTCLIX LANCETS MISC: 12 refills | Status: AC

## 2024-09-07 NOTE — Telephone Encounter (Signed)
 PA request has been Received. New Encounter has been or will be created for follow up. For additional info see Pharmacy Prior Auth telephone encounter from 09/07/24.

## 2024-09-07 NOTE — Telephone Encounter (Signed)
 Pharmacy Patient Advocate Encounter   Received notification from RX Request Messages that prior authorization for One Touch Ultra Test strips is required/requested.   Insurance verification completed.   The patient is insured through [employer].   Per test claim:  ACCU-CHEK GUIDE is preferred by the insurance.  If suggested medication is appropriate, Please send in a new RX and discontinue this one. If not, please advise as to why it's not appropriate so that we may request a Prior Authorization. Please note, some preferred medications may still require a PA.  If the suggested medications have not been trialed and there are no contraindications to their use, the PA will not be submitted, as it will not be approved. Archived Key: NO KEY  **Unless there is a medical reason the patient can't use the preferred the PA will be denied, I also test billed the preferred and his copay is $0.00 for kit and strips**

## 2024-09-07 NOTE — Telephone Encounter (Signed)
 Thank you for the update. I have sent in the Accu-chek meter and supplies

## 2024-09-07 NOTE — Telephone Encounter (Signed)
 Awesome!

## 2024-09-10 ENCOUNTER — Telehealth: Payer: Self-pay

## 2024-09-10 NOTE — Telephone Encounter (Signed)
 Copied from CRM [id number]. Topic: Clinical - Prescription Issue >> Sep 10, 2024 11:51 AM Ivette P wrote: Reason for CRM: PT called in about capsicum (ZOSTRIX) 0.075 % topical cream.   Insurance wont cover this thorough mail order. And pt would like to know if there is another option.    Does not want to send to [facility] due to having issues in the past and messing up his mail order   Or if a prescription script can be written so he can come by the office to pick up and go get it himself at [facility].   Please follow up with pt before end of day

## 2024-09-10 NOTE — Telephone Encounter (Signed)
 Please review

## 2024-09-10 NOTE — Telephone Encounter (Signed)
 Please review, I routed to Dr. Sol but you were the one who prescribed the medication on 08/26/2024

## 2024-09-10 NOTE — Telephone Encounter (Signed)
 sent in the Accu-chek meter and supplies

## 2024-09-10 NOTE — Telephone Encounter (Signed)
 He can check with PT

## 2024-09-14 ENCOUNTER — Other Ambulatory Visit: Payer: Self-pay | Admitting: Student

## 2024-09-14 MED ORDER — CAPSAICIN 0.075 % EX CREA: 1.0000 | TOPICAL_CREAM | Freq: Four times a day (QID) | CUTANEOUS | 0 refills | Status: AC

## 2024-09-14 MED ORDER — CAPSAICIN 0.075 % EX CREA: 1.0000 | TOPICAL_CREAM | Freq: Four times a day (QID) | CUTANEOUS | 0 refills | Status: DC

## 2024-09-14 NOTE — Telephone Encounter (Signed)
 Spoke with patient, informed him that Rx has been print and he can pick up Rx at the front desk. Patient verbalized understanding.

## 2024-09-17 LAB — HEMOGLOBIN A1C: Hemoglobin A1C: 7.3

## 2024-09-21 ENCOUNTER — Telehealth: Payer: Self-pay

## 2024-09-21 NOTE — Telephone Encounter (Signed)
 Copied from CRM [id number]. Topic: Clinical - Lab/Test Results >> Sep 20, 2024  2:42 PM Bruce Richardson wrote: Reason for CRM: Tawni with [employer] calling to inform provider that patient had an in home A1C test completed on 09/17/2024 and result was abnormal at 7.3.  Callback [phone]

## 2024-09-27 ENCOUNTER — Ambulatory Visit: Admitting: Family Medicine

## 2024-11-10 ENCOUNTER — Ambulatory Visit

## 2024-11-11 ENCOUNTER — Ambulatory Visit
